# Patient Record
Sex: Male | Born: 1935 | Race: Black or African American | Hispanic: No | Marital: Married | Smoking: Former smoker
Health system: Southern US, Community
[De-identification: ages and names within clinical notes are randomized; demographics above are authoritative.]

## PROBLEM LIST (undated history)

## (undated) DIAGNOSIS — I5032 Chronic diastolic (congestive) heart failure: Secondary | ICD-10-CM

## (undated) DIAGNOSIS — E119 Type 2 diabetes mellitus without complications: Secondary | ICD-10-CM

## (undated) DIAGNOSIS — D649 Anemia, unspecified: Secondary | ICD-10-CM

## (undated) DIAGNOSIS — C8590 Non-Hodgkin lymphoma, unspecified, unspecified site: Secondary | ICD-10-CM

## (undated) DIAGNOSIS — I10 Essential (primary) hypertension: Secondary | ICD-10-CM

## (undated) DIAGNOSIS — I4891 Unspecified atrial fibrillation: Secondary | ICD-10-CM

## (undated) DIAGNOSIS — C801 Malignant (primary) neoplasm, unspecified: Secondary | ICD-10-CM

## (undated) DIAGNOSIS — F0390 Unspecified dementia without behavioral disturbance: Secondary | ICD-10-CM

## (undated) HISTORY — DX: Unspecified dementia, unspecified severity, without behavioral disturbance, psychotic disturbance, mood disturbance, and anxiety: F03.90

## (undated) HISTORY — DX: Unspecified atrial fibrillation: I48.91

## (undated) HISTORY — PX: NO PAST SURGERIES: SHX2092

## (undated) HISTORY — PX: CORONARY STENT PLACEMENT: SHX1402

## (undated) HISTORY — DX: Anemia, unspecified: D64.9

## (undated) HISTORY — DX: Non-Hodgkin lymphoma, unspecified, unspecified site: C85.90

## (undated) HISTORY — DX: Chronic diastolic (congestive) heart failure: I50.32

## (undated) HISTORY — DX: Essential (primary) hypertension: I10

## (undated) HISTORY — DX: Type 2 diabetes mellitus without complications: E11.9

---

## 2000-11-25 ENCOUNTER — Encounter: Payer: Self-pay | Admitting: Emergency Medicine

## 2000-11-25 ENCOUNTER — Emergency Department (HOSPITAL_COMMUNITY): Admission: EM | Admit: 2000-11-25 | Discharge: 2000-11-26 | Payer: Self-pay | Admitting: Emergency Medicine

## 2001-10-17 ENCOUNTER — Ambulatory Visit (HOSPITAL_COMMUNITY): Admission: RE | Admit: 2001-10-17 | Discharge: 2001-10-17 | Payer: Self-pay | Admitting: Gastroenterology

## 2001-10-17 ENCOUNTER — Encounter (INDEPENDENT_AMBULATORY_CARE_PROVIDER_SITE_OTHER): Payer: Self-pay | Admitting: Specialist

## 2008-05-23 ENCOUNTER — Ambulatory Visit: Payer: Self-pay | Admitting: Oncology

## 2008-05-27 ENCOUNTER — Encounter (INDEPENDENT_AMBULATORY_CARE_PROVIDER_SITE_OTHER): Payer: Self-pay | Admitting: Diagnostic Radiology

## 2008-05-27 ENCOUNTER — Ambulatory Visit (HOSPITAL_COMMUNITY): Admission: RE | Admit: 2008-05-27 | Discharge: 2008-05-27 | Payer: Self-pay | Admitting: Neurosurgery

## 2008-06-03 ENCOUNTER — Ambulatory Visit: Admission: RE | Admit: 2008-06-03 | Discharge: 2008-07-23 | Payer: Self-pay | Admitting: Radiation Oncology

## 2008-06-03 LAB — CBC WITH DIFFERENTIAL (CANCER CENTER ONLY)
Eosinophils Absolute: 0.4 10*3/uL (ref 0.0–0.5)
HCT: 28.9 % — ABNORMAL LOW (ref 38.7–49.9)
LYMPH%: 14.9 % (ref 14.0–48.0)
MCV: 66 fL — ABNORMAL LOW (ref 82–98)
MONO#: 0.3 10*3/uL (ref 0.1–0.9)
NEUT%: 69.2 % (ref 40.0–80.0)
Platelets: 349 10*3/uL (ref 145–400)
RBC: 4.35 10*6/uL (ref 4.20–5.70)
WBC: 4.7 10*3/uL (ref 4.0–10.0)

## 2008-06-03 LAB — CMP (CANCER CENTER ONLY)
Alkaline Phosphatase: 62 U/L (ref 26–84)
Creat: 1.4 mg/dl — ABNORMAL HIGH (ref 0.6–1.2)
Glucose, Bld: 212 mg/dL — ABNORMAL HIGH (ref 73–118)
Sodium: 138 mEq/L (ref 128–145)
Total Bilirubin: 0.5 mg/dl (ref 0.20–1.60)
Total Protein: 7.9 g/dL (ref 6.4–8.1)

## 2008-06-03 LAB — IRON AND TIBC: TIBC: 473 ug/dL — ABNORMAL HIGH (ref 215–435)

## 2008-06-04 LAB — BETA 2 MICROGLOBULIN, SERUM: Beta-2 Microglobulin: 2.85 mg/L — ABNORMAL HIGH (ref 1.01–1.73)

## 2008-06-04 LAB — URIC ACID: Uric Acid, Serum: 7.1 mg/dL (ref 4.0–7.8)

## 2008-06-05 ENCOUNTER — Ambulatory Visit (HOSPITAL_COMMUNITY): Admission: RE | Admit: 2008-06-05 | Discharge: 2008-06-05 | Payer: Self-pay | Admitting: Oncology

## 2008-06-11 LAB — CBC WITH DIFFERENTIAL (CANCER CENTER ONLY)
BASO#: 0 10*3/uL (ref 0.0–0.2)
BASO%: 0.4 % (ref 0.0–2.0)
HCT: 29.9 % — ABNORMAL LOW (ref 38.7–49.9)
HGB: 9.7 g/dL — ABNORMAL LOW (ref 13.0–17.1)
LYMPH%: 15.5 % (ref 14.0–48.0)
MCV: 69 fL — ABNORMAL LOW (ref 82–98)
MONO#: 0.4 10*3/uL (ref 0.1–0.9)
NEUT%: 70.2 % (ref 40.0–80.0)
RDW: 17.9 % — ABNORMAL HIGH (ref 10.5–14.6)
WBC: 6 10*3/uL (ref 4.0–10.0)

## 2008-06-16 ENCOUNTER — Inpatient Hospital Stay (HOSPITAL_COMMUNITY): Admission: EM | Admit: 2008-06-16 | Discharge: 2008-06-23 | Payer: Self-pay | Admitting: Emergency Medicine

## 2008-06-16 ENCOUNTER — Ambulatory Visit: Payer: Self-pay | Admitting: Oncology

## 2008-06-16 ENCOUNTER — Ambulatory Visit: Payer: Self-pay | Admitting: Internal Medicine

## 2008-06-21 ENCOUNTER — Ambulatory Visit: Payer: Self-pay | Admitting: Oncology

## 2008-06-26 LAB — CBC WITH DIFFERENTIAL (CANCER CENTER ONLY)
BASO#: 0 10*3/uL (ref 0.0–0.2)
EOS%: 1.2 % (ref 0.0–7.0)
Eosinophils Absolute: 0.1 10*3/uL (ref 0.0–0.5)
HGB: 11.3 g/dL — ABNORMAL LOW (ref 13.0–17.1)
LYMPH#: 0.2 10*3/uL — ABNORMAL LOW (ref 0.9–3.3)
MCHC: 32.2 g/dL (ref 32.0–35.9)
MONO%: 5.6 % (ref 0.0–13.0)
NEUT#: 6.8 10*3/uL — ABNORMAL HIGH (ref 1.5–6.5)
Platelets: 200 10*3/uL (ref 145–400)
RBC: 4.87 10*6/uL (ref 4.20–5.70)

## 2008-06-26 LAB — BASIC METABOLIC PANEL - CANCER CENTER ONLY
BUN, Bld: 30 mg/dL — ABNORMAL HIGH (ref 7–22)
Chloride: 98 mEq/L (ref 98–108)
Glucose, Bld: 453 mg/dL — ABNORMAL HIGH (ref 73–118)
Potassium: 4.7 mEq/L (ref 3.3–4.7)

## 2008-07-02 ENCOUNTER — Ambulatory Visit: Payer: Self-pay | Admitting: Oncology

## 2008-07-02 ENCOUNTER — Ambulatory Visit (HOSPITAL_COMMUNITY): Admission: RE | Admit: 2008-07-02 | Discharge: 2008-07-02 | Payer: Self-pay | Admitting: Oncology

## 2008-07-02 LAB — CBC WITH DIFFERENTIAL (CANCER CENTER ONLY)
BASO%: 0.3 % (ref 0.0–2.0)
EOS%: 1.5 % (ref 0.0–7.0)
LYMPH#: 0.1 10*3/uL — ABNORMAL LOW (ref 0.9–3.3)
MCH: 23.8 pg — ABNORMAL LOW (ref 28.0–33.4)
MCHC: 32.3 g/dL (ref 32.0–35.9)
MONO%: 3.6 % (ref 0.0–13.0)
NEUT#: 6.6 10*3/uL — ABNORMAL HIGH (ref 1.5–6.5)
RDW: 22 % — ABNORMAL HIGH (ref 10.5–14.6)

## 2008-07-02 LAB — CMP (CANCER CENTER ONLY)
Albumin: 2.9 g/dL — ABNORMAL LOW (ref 3.3–5.5)
Alkaline Phosphatase: 50 U/L (ref 26–84)
BUN, Bld: 28 mg/dL — ABNORMAL HIGH (ref 7–22)
Glucose, Bld: 286 mg/dL — ABNORMAL HIGH (ref 73–118)
Potassium: 4.7 mEq/L (ref 3.3–4.7)

## 2008-07-04 LAB — CBC WITH DIFFERENTIAL/PLATELET
BASO%: 0.3 % (ref 0.0–2.0)
Eosinophils Absolute: 0.1 10*3/uL (ref 0.0–0.5)
HCT: 39 % (ref 38.4–49.9)
LYMPH%: 3.1 % — ABNORMAL LOW (ref 14.0–49.0)
MCHC: 32.3 g/dL (ref 32.0–36.0)
MCV: 75 fL — ABNORMAL LOW (ref 79.3–98.0)
MONO#: 0.2 10*3/uL (ref 0.1–0.9)
NEUT%: 91.3 % — ABNORMAL HIGH (ref 39.0–75.0)
Platelets: 31 10*3/uL — ABNORMAL LOW (ref 140–400)
WBC: 6.5 10*3/uL (ref 4.0–10.3)

## 2008-07-10 LAB — CBC WITH DIFFERENTIAL (CANCER CENTER ONLY)
BASO#: 0 10*3/uL (ref 0.0–0.2)
BASO%: 0.3 % (ref 0.0–2.0)
HCT: 34.5 % — ABNORMAL LOW (ref 38.7–49.9)
HGB: 11.5 g/dL — ABNORMAL LOW (ref 13.0–17.1)
LYMPH#: 0.2 10*3/uL — ABNORMAL LOW (ref 0.9–3.3)
MONO#: 0.3 10*3/uL (ref 0.1–0.9)
NEUT%: 83.4 % — ABNORMAL HIGH (ref 40.0–80.0)
RBC: 4.77 10*6/uL (ref 4.20–5.70)
RDW: 23.6 % — ABNORMAL HIGH (ref 10.5–14.6)
WBC: 4.1 10*3/uL (ref 4.0–10.0)

## 2008-07-10 LAB — CMP (CANCER CENTER ONLY)
BUN, Bld: 19 mg/dL (ref 7–22)
CO2: 27 mEq/L (ref 18–33)
Calcium: 9.2 mg/dL (ref 8.0–10.3)
Chloride: 93 mEq/L — ABNORMAL LOW (ref 98–108)
Creat: 1.1 mg/dl (ref 0.6–1.2)

## 2008-07-17 LAB — CBC WITH DIFFERENTIAL (CANCER CENTER ONLY)
BASO#: 0 10*3/uL (ref 0.0–0.2)
EOS%: 2 % (ref 0.0–7.0)
HCT: 34.2 % — ABNORMAL LOW (ref 38.7–49.9)
HGB: 11.5 g/dL — ABNORMAL LOW (ref 13.0–17.1)
LYMPH#: 0.2 10*3/uL — ABNORMAL LOW (ref 0.9–3.3)
MCHC: 33.8 g/dL (ref 32.0–35.9)
NEUT%: 73.6 % (ref 40.0–80.0)

## 2008-07-17 LAB — BASIC METABOLIC PANEL - CANCER CENTER ONLY
CO2: 27 mEq/L (ref 18–33)
Chloride: 98 mEq/L (ref 98–108)
Potassium: 4.8 mEq/L — ABNORMAL HIGH (ref 3.3–4.7)
Sodium: 140 mEq/L (ref 128–145)

## 2008-07-23 ENCOUNTER — Encounter: Admission: RE | Admit: 2008-07-23 | Discharge: 2008-10-21 | Payer: Self-pay | Admitting: Oncology

## 2008-07-24 LAB — CMP (CANCER CENTER ONLY)
ALT(SGPT): 35 U/L (ref 10–47)
BUN, Bld: 16 mg/dL (ref 7–22)
CO2: 29 mEq/L (ref 18–33)
Calcium: 9.3 mg/dL (ref 8.0–10.3)
Chloride: 98 mEq/L (ref 98–108)
Creat: 1.6 mg/dl — ABNORMAL HIGH (ref 0.6–1.2)

## 2008-07-24 LAB — CBC WITH DIFFERENTIAL (CANCER CENTER ONLY)
BASO#: 0 10*3/uL (ref 0.0–0.2)
BASO%: 0.9 % (ref 0.0–2.0)
EOS%: 0.5 % (ref 0.0–7.0)
HCT: 33.7 % — ABNORMAL LOW (ref 38.7–49.9)
LYMPH%: 24.6 % (ref 14.0–48.0)
MCH: 24.8 pg — ABNORMAL LOW (ref 28.0–33.4)
MCHC: 34.1 g/dL (ref 32.0–35.9)
MCV: 73 fL — ABNORMAL LOW (ref 82–98)
MONO%: 7.7 % (ref 0.0–13.0)
NEUT%: 66.3 % (ref 40.0–80.0)
RDW: 25.5 % — ABNORMAL HIGH (ref 10.5–14.6)

## 2008-07-29 LAB — COMPREHENSIVE METABOLIC PANEL
ALT: 25 U/L (ref 0–53)
CO2: 29 mEq/L (ref 19–32)
Chloride: 98 mEq/L (ref 96–112)
Sodium: 136 mEq/L (ref 135–145)
Total Bilirubin: 0.6 mg/dL (ref 0.3–1.2)
Total Protein: 5.7 g/dL — ABNORMAL LOW (ref 6.0–8.3)

## 2008-07-29 LAB — CBC WITH DIFFERENTIAL (CANCER CENTER ONLY)
Eosinophils Absolute: 0 10*3/uL (ref 0.0–0.5)
MONO#: 0.4 10*3/uL (ref 0.1–0.9)
NEUT#: 2.7 10*3/uL (ref 1.5–6.5)
Platelets: 162 10*3/uL (ref 145–400)
RBC: 4.75 10*6/uL (ref 4.20–5.70)
WBC: 5.8 10*3/uL (ref 4.0–10.0)

## 2008-08-05 ENCOUNTER — Ambulatory Visit: Payer: Self-pay | Admitting: Oncology

## 2008-08-07 LAB — CBC WITH DIFFERENTIAL (CANCER CENTER ONLY)
BASO#: 0.1 10*3/uL (ref 0.0–0.2)
EOS%: 0.7 % (ref 0.0–7.0)
HGB: 10 g/dL — ABNORMAL LOW (ref 13.0–17.1)
MCH: 24.5 pg — ABNORMAL LOW (ref 28.0–33.4)
MCHC: 33.2 g/dL (ref 32.0–35.9)
MONO%: 9.1 % (ref 0.0–13.0)
NEUT#: 2.3 10*3/uL (ref 1.5–6.5)
NEUT%: 40.1 % (ref 40.0–80.0)
RDW: 22.6 % — ABNORMAL HIGH (ref 10.5–14.6)

## 2008-08-07 LAB — BASIC METABOLIC PANEL - CANCER CENTER ONLY
BUN, Bld: 14 mg/dL (ref 7–22)
Creat: 1.3 mg/dl — ABNORMAL HIGH (ref 0.6–1.2)
Glucose, Bld: 126 mg/dL — ABNORMAL HIGH (ref 73–118)
Potassium: 4.6 mEq/L (ref 3.3–4.7)

## 2008-08-14 LAB — CMP (CANCER CENTER ONLY)
Alkaline Phosphatase: 91 U/L — ABNORMAL HIGH (ref 26–84)
Glucose, Bld: 191 mg/dL — ABNORMAL HIGH (ref 73–118)
Sodium: 138 mEq/L (ref 128–145)
Total Bilirubin: 0.4 mg/dl (ref 0.20–1.60)
Total Protein: 6.1 g/dL — ABNORMAL LOW (ref 6.4–8.1)

## 2008-08-14 LAB — MANUAL DIFFERENTIAL (CHCC SATELLITE)
Band Neutrophils: 17 % — ABNORMAL HIGH (ref 0–10)
LYMPH: 39 % (ref 14–48)
MONO: 14 % — ABNORMAL HIGH (ref 0–13)
Myelocytes: 1 % — ABNORMAL HIGH (ref 0–0)
SEG: 29 % — ABNORMAL LOW (ref 40–75)

## 2008-08-14 LAB — CBC WITH DIFFERENTIAL (CANCER CENTER ONLY)
MCH: 24.8 pg — ABNORMAL LOW (ref 28.0–33.4)
MCHC: 33.7 g/dL (ref 32.0–35.9)
Platelets: 126 10*3/uL — ABNORMAL LOW (ref 145–400)

## 2008-08-28 ENCOUNTER — Encounter (HOSPITAL_COMMUNITY): Admission: RE | Admit: 2008-08-28 | Discharge: 2008-10-02 | Payer: Self-pay | Admitting: Oncology

## 2008-08-28 LAB — CBC WITH DIFFERENTIAL (CANCER CENTER ONLY)
BASO%: 0.5 % (ref 0.0–2.0)
LYMPH#: 0.8 10*3/uL — ABNORMAL LOW (ref 0.9–3.3)
MONO#: 0.4 10*3/uL (ref 0.1–0.9)
NEUT#: 3.7 10*3/uL (ref 1.5–6.5)
Platelets: 211 10*3/uL (ref 145–400)
RDW: 22 % — ABNORMAL HIGH (ref 10.5–14.6)
WBC: 4.9 10*3/uL (ref 4.0–10.0)

## 2008-08-28 LAB — BASIC METABOLIC PANEL - CANCER CENTER ONLY
BUN, Bld: 12 mg/dL (ref 7–22)
CO2: 28 mEq/L (ref 18–33)
Chloride: 102 mEq/L (ref 98–108)
Potassium: 3.8 mEq/L (ref 3.3–4.7)

## 2008-09-02 ENCOUNTER — Ambulatory Visit: Payer: Self-pay | Admitting: Oncology

## 2008-09-04 LAB — MANUAL DIFFERENTIAL (CHCC SATELLITE)
ALC: 0.5 10*3/uL — ABNORMAL LOW (ref 0.9–3.3)
Eos: 3 % (ref 0–7)
LYMPH: 10 % — ABNORMAL LOW (ref 14–48)
MONO: 4 % (ref 0–13)
PLT EST ~~LOC~~: DECREASED
nRBC: 2 % — ABNORMAL HIGH (ref 0–0)

## 2008-09-04 LAB — CMP (CANCER CENTER ONLY)
AST: 22 U/L (ref 11–38)
Albumin: 3.2 g/dL — ABNORMAL LOW (ref 3.3–5.5)
Alkaline Phosphatase: 101 U/L — ABNORMAL HIGH (ref 26–84)
Glucose, Bld: 130 mg/dL — ABNORMAL HIGH (ref 73–118)
Potassium: 3.5 mEq/L (ref 3.3–4.7)
Sodium: 142 mEq/L (ref 128–145)
Total Bilirubin: 0.5 mg/dl (ref 0.20–1.60)
Total Protein: 6.1 g/dL — ABNORMAL LOW (ref 6.4–8.1)

## 2008-09-04 LAB — CBC WITH DIFFERENTIAL (CANCER CENTER ONLY)
HCT: 27.5 % — ABNORMAL LOW (ref 38.7–49.9)
MCHC: 32.8 g/dL (ref 32.0–35.9)
MCV: 85 fL (ref 82–98)
Platelets: 89 10*3/uL — ABNORMAL LOW (ref 145–400)
RDW: 19.1 % — ABNORMAL HIGH (ref 10.5–14.6)
WBC: 4.8 10*3/uL (ref 4.0–10.0)

## 2008-09-05 ENCOUNTER — Encounter: Payer: Self-pay | Admitting: Cardiology

## 2008-09-10 LAB — CBC WITH DIFFERENTIAL (CANCER CENTER ONLY)
BASO#: 0 10*3/uL (ref 0.0–0.2)
EOS%: 1.7 % (ref 0.0–7.0)
HGB: 10 g/dL — ABNORMAL LOW (ref 13.0–17.1)
LYMPH#: 0.8 10*3/uL — ABNORMAL LOW (ref 0.9–3.3)
MCH: 28.4 pg (ref 28.0–33.4)
MCHC: 32.5 g/dL (ref 32.0–35.9)
MONO%: 12.7 % (ref 0.0–13.0)
NEUT#: 3.4 10*3/uL (ref 1.5–6.5)
Platelets: 175 10*3/uL (ref 145–400)
RBC: 3.52 10*6/uL — ABNORMAL LOW (ref 4.20–5.70)

## 2008-09-10 LAB — TECHNOLOGIST REVIEW CHCC SATELLITE

## 2008-09-18 LAB — CBC WITH DIFFERENTIAL (CANCER CENTER ONLY)
BASO#: 0 10*3/uL (ref 0.0–0.2)
Eosinophils Absolute: 0.1 10*3/uL (ref 0.0–0.5)
HCT: 28.9 % — ABNORMAL LOW (ref 38.7–49.9)
HGB: 9.7 g/dL — ABNORMAL LOW (ref 13.0–17.1)
LYMPH%: 18.1 % (ref 14.0–48.0)
MCH: 29.6 pg (ref 28.0–33.4)
MCV: 88 fL (ref 82–98)
MONO#: 0.4 10*3/uL (ref 0.1–0.9)
NEUT%: 72.6 % (ref 40.0–80.0)
Platelets: 195 10*3/uL (ref 145–400)
RBC: 3.27 10*6/uL — ABNORMAL LOW (ref 4.20–5.70)
WBC: 4.9 10*3/uL (ref 4.0–10.0)

## 2008-09-18 LAB — CMP (CANCER CENTER ONLY)
ALT(SGPT): 14 U/L (ref 10–47)
Albumin: 3.6 g/dL (ref 3.3–5.5)
Alkaline Phosphatase: 61 U/L (ref 26–84)
CO2: 29 mEq/L (ref 18–33)
Potassium: 4 mEq/L (ref 3.3–4.7)
Sodium: 143 mEq/L (ref 128–145)
Total Bilirubin: 0.5 mg/dl (ref 0.20–1.60)
Total Protein: 6.6 g/dL (ref 6.4–8.1)

## 2008-09-25 LAB — CBC WITH DIFFERENTIAL (CANCER CENTER ONLY)
HCT: 24.9 % — ABNORMAL LOW (ref 38.7–49.9)
MCH: 29.8 pg (ref 28.0–33.4)
MCV: 89 fL (ref 82–98)
RDW: 17.7 % — ABNORMAL HIGH (ref 10.5–14.6)
WBC: 1.6 10*3/uL — ABNORMAL LOW (ref 4.0–10.0)

## 2008-09-25 LAB — MANUAL DIFFERENTIAL (CHCC SATELLITE)
ALC: 0.4 10*3/uL — ABNORMAL LOW (ref 0.9–3.3)
ANC (CHCC HP manual diff): 0.9 10*3/uL — ABNORMAL LOW (ref 1.5–6.5)
BASO: 2 % (ref 0–2)
Blasts: 1 % — ABNORMAL HIGH (ref 0–0)
Metamyelocytes: 7 % — ABNORMAL HIGH (ref 0–0)
Myelocytes: 5 % — ABNORMAL HIGH (ref 0–0)
PLT EST ~~LOC~~: DECREASED
PROMYELO: 1 % — ABNORMAL HIGH (ref 0–0)

## 2008-09-25 LAB — BASIC METABOLIC PANEL - CANCER CENTER ONLY
BUN, Bld: 25 mg/dL — ABNORMAL HIGH (ref 7–22)
Calcium: 9.6 mg/dL (ref 8.0–10.3)
Glucose, Bld: 114 mg/dL (ref 73–118)

## 2008-10-08 ENCOUNTER — Ambulatory Visit: Payer: Self-pay | Admitting: Oncology

## 2008-10-09 LAB — CMP (CANCER CENTER ONLY)
AST: 27 U/L (ref 11–38)
Albumin: 3.8 g/dL (ref 3.3–5.5)
Alkaline Phosphatase: 64 U/L (ref 26–84)
Calcium: 9.5 mg/dL (ref 8.0–10.3)
Chloride: 106 mEq/L (ref 98–108)
Potassium: 4.3 mEq/L (ref 3.3–4.7)
Sodium: 144 mEq/L (ref 128–145)
Total Protein: 6.8 g/dL (ref 6.4–8.1)

## 2008-10-09 LAB — CBC WITH DIFFERENTIAL (CANCER CENTER ONLY)
BASO#: 0 10*3/uL (ref 0.0–0.2)
EOS%: 1.5 % (ref 0.0–7.0)
HCT: 30.7 % — ABNORMAL LOW (ref 38.7–49.9)
HGB: 10.2 g/dL — ABNORMAL LOW (ref 13.0–17.1)
LYMPH#: 1.2 10*3/uL (ref 0.9–3.3)
MCHC: 33.2 g/dL (ref 32.0–35.9)
MONO#: 0.5 10*3/uL (ref 0.1–0.9)
NEUT%: 63.6 % (ref 40.0–80.0)

## 2008-10-16 LAB — CBC WITH DIFFERENTIAL (CANCER CENTER ONLY)
BASO#: 0 10*3/uL (ref 0.0–0.2)
Eosinophils Absolute: 0 10*3/uL (ref 0.0–0.5)
HGB: 8.8 g/dL — ABNORMAL LOW (ref 13.0–17.1)
LYMPH#: 0.5 10*3/uL — ABNORMAL LOW (ref 0.9–3.3)
MONO#: 0.5 10*3/uL (ref 0.1–0.9)
NEUT#: 0.1 10*3/uL — CL (ref 1.5–6.5)
Platelets: 48 10*3/uL — ABNORMAL LOW (ref 145–400)
RBC: 2.87 10*6/uL — ABNORMAL LOW (ref 4.20–5.70)
WBC: 1.1 10*3/uL — ABNORMAL LOW (ref 4.0–10.0)

## 2008-10-16 LAB — TECHNOLOGIST REVIEW CHCC SATELLITE

## 2008-10-16 LAB — BASIC METABOLIC PANEL - CANCER CENTER ONLY
BUN, Bld: 19 mg/dL (ref 7–22)
Calcium: 9.2 mg/dL (ref 8.0–10.3)
Creat: 1.1 mg/dl (ref 0.6–1.2)

## 2008-10-22 LAB — MANUAL DIFFERENTIAL (CHCC SATELLITE)
ANC (CHCC HP manual diff): 4.9 10*3/uL (ref 1.5–6.5)
Blasts: 1 % — ABNORMAL HIGH (ref 0–0)
LYMPH: 10 % — ABNORMAL LOW (ref 14–48)
Metamyelocytes: 3 % — ABNORMAL HIGH (ref 0–0)

## 2008-10-22 LAB — CBC WITH DIFFERENTIAL (CANCER CENTER ONLY)
HCT: 27.8 % — ABNORMAL LOW (ref 38.7–49.9)
HGB: 9.1 g/dL — ABNORMAL LOW (ref 13.0–17.1)
MCH: 31.1 pg (ref 28.0–33.4)
MCHC: 32.7 g/dL (ref 32.0–35.9)

## 2008-10-30 LAB — CBC WITH DIFFERENTIAL (CANCER CENTER ONLY)
BASO%: 0.4 % (ref 0.0–2.0)
HCT: 29.2 % — ABNORMAL LOW (ref 38.7–49.9)
LYMPH%: 22 % (ref 14.0–48.0)
MCH: 31.6 pg (ref 28.0–33.4)
MCHC: 33 g/dL (ref 32.0–35.9)
MCV: 96 fL (ref 82–98)
MONO%: 13 % (ref 0.0–13.0)
NEUT%: 63.3 % (ref 40.0–80.0)
Platelets: 154 10*3/uL (ref 145–400)
RDW: 14.5 % (ref 10.5–14.6)

## 2008-10-30 LAB — CMP (CANCER CENTER ONLY)
ALT(SGPT): 16 U/L (ref 10–47)
Albumin: 3.4 g/dL (ref 3.3–5.5)
CO2: 27 mEq/L (ref 18–33)
Calcium: 9.3 mg/dL (ref 8.0–10.3)
Chloride: 103 mEq/L (ref 98–108)
Glucose, Bld: 199 mg/dL — ABNORMAL HIGH (ref 73–118)
Potassium: 4.4 mEq/L (ref 3.3–4.7)
Sodium: 141 mEq/L (ref 128–145)
Total Bilirubin: 0.5 mg/dl (ref 0.20–1.60)
Total Protein: 6.3 g/dL — ABNORMAL LOW (ref 6.4–8.1)

## 2008-11-06 LAB — CBC WITH DIFFERENTIAL/PLATELET
Basophils Absolute: 0 10*3/uL (ref 0.0–0.1)
Eosinophils Absolute: 0 10*3/uL (ref 0.0–0.5)
HCT: 25.4 % — ABNORMAL LOW (ref 38.4–49.9)
HGB: 8.7 g/dL — ABNORMAL LOW (ref 13.0–17.1)
LYMPH%: 29.8 % (ref 14.0–49.0)
MCV: 97.6 fL (ref 79.3–98.0)
MONO#: 0.2 10*3/uL (ref 0.1–0.9)
MONO%: 17.1 % — ABNORMAL HIGH (ref 0.0–14.0)
NEUT#: 0.6 10*3/uL — ABNORMAL LOW (ref 1.5–6.5)
NEUT%: 48.9 % (ref 39.0–75.0)
Platelets: 13 10*3/uL — ABNORMAL LOW (ref 140–400)
RBC: 2.6 10*6/uL — ABNORMAL LOW (ref 4.20–5.82)
WBC: 1.2 10*3/uL — ABNORMAL LOW (ref 4.0–10.3)

## 2008-11-06 LAB — BASIC METABOLIC PANEL - CANCER CENTER ONLY
BUN, Bld: 27 mg/dL — ABNORMAL HIGH (ref 7–22)
Chloride: 101 mEq/L (ref 98–108)
Potassium: 3.9 mEq/L (ref 3.3–4.7)
Sodium: 138 mEq/L (ref 128–145)

## 2008-11-07 ENCOUNTER — Encounter (HOSPITAL_COMMUNITY): Admission: RE | Admit: 2008-11-07 | Discharge: 2009-01-01 | Payer: Self-pay | Admitting: Oncology

## 2008-11-07 ENCOUNTER — Ambulatory Visit: Payer: Self-pay | Admitting: Oncology

## 2008-11-11 LAB — MANUAL DIFFERENTIAL (CHCC SATELLITE)
Band Neutrophils: 15 % — ABNORMAL HIGH (ref 0–10)
Eos: 2 % (ref 0–7)
LYMPH: 11 % — ABNORMAL LOW (ref 14–48)
MONO: 6 % (ref 0–13)
Metamyelocytes: 1 % — ABNORMAL HIGH (ref 0–0)
PLT EST ~~LOC~~: DECREASED
nRBC: 2 % — ABNORMAL HIGH (ref 0–0)

## 2008-11-11 LAB — CBC WITH DIFFERENTIAL (CANCER CENTER ONLY)
MCHC: 33.1 g/dL (ref 32.0–35.9)
Platelets: 87 10*3/uL — ABNORMAL LOW (ref 145–400)
RDW: 13.8 % (ref 10.5–14.6)
WBC: 4.8 10*3/uL (ref 4.0–10.0)

## 2008-11-20 ENCOUNTER — Encounter: Payer: Self-pay | Admitting: Cardiology

## 2008-11-20 LAB — CBC WITH DIFFERENTIAL (CANCER CENTER ONLY)
BASO%: 0.6 % (ref 0.0–2.0)
Eosinophils Absolute: 0.1 10*3/uL (ref 0.0–0.5)
HCT: 27.7 % — ABNORMAL LOW (ref 38.7–49.9)
HGB: 9 g/dL — ABNORMAL LOW (ref 13.0–17.1)
LYMPH#: 0.8 10*3/uL — ABNORMAL LOW (ref 0.9–3.3)
MONO#: 0.4 10*3/uL (ref 0.1–0.9)
NEUT%: 58.9 % (ref 40.0–80.0)
RBC: 2.83 10*6/uL — ABNORMAL LOW (ref 4.20–5.70)
RDW: 14.1 % (ref 10.5–14.6)
WBC: 3.1 10*3/uL — ABNORMAL LOW (ref 4.0–10.0)

## 2008-11-20 LAB — CMP (CANCER CENTER ONLY)
AST: 26 U/L (ref 11–38)
Albumin: 3.4 g/dL (ref 3.3–5.5)
Alkaline Phosphatase: 61 U/L (ref 26–84)
Glucose, Bld: 209 mg/dL — ABNORMAL HIGH (ref 73–118)
Potassium: 4.1 mEq/L (ref 3.3–4.7)
Sodium: 141 mEq/L (ref 128–145)
Total Bilirubin: 0.4 mg/dl (ref 0.20–1.60)
Total Protein: 6.3 g/dL — ABNORMAL LOW (ref 6.4–8.1)

## 2008-11-26 LAB — MANUAL DIFFERENTIAL (CHCC SATELLITE)
ALC: 0.5 10*3/uL — ABNORMAL LOW (ref 0.9–3.3)
BASO: 1 % (ref 0–2)
LYMPH: 11 % — ABNORMAL LOW (ref 14–48)
MONO: 3 % (ref 0–13)
PLT EST ~~LOC~~: DECREASED
SEG: 72 % (ref 40–75)

## 2008-11-26 LAB — CBC WITH DIFFERENTIAL (CANCER CENTER ONLY)
MCH: 32.1 pg (ref 28.0–33.4)
Platelets: 60 10*3/uL — ABNORMAL LOW (ref 145–400)
RBC: 2.55 10*6/uL — ABNORMAL LOW (ref 4.20–5.70)
WBC: 4.3 10*3/uL (ref 4.0–10.0)

## 2008-12-19 ENCOUNTER — Ambulatory Visit: Payer: Self-pay | Admitting: Oncology

## 2008-12-22 ENCOUNTER — Ambulatory Visit (HOSPITAL_COMMUNITY): Admission: RE | Admit: 2008-12-22 | Discharge: 2008-12-22 | Payer: Self-pay | Admitting: Oncology

## 2008-12-24 ENCOUNTER — Encounter: Payer: Self-pay | Admitting: Cardiology

## 2008-12-24 LAB — CMP (CANCER CENTER ONLY)
ALT(SGPT): 16 U/L (ref 10–47)
AST: 25 U/L (ref 11–38)
Alkaline Phosphatase: 62 U/L (ref 26–84)
CO2: 30 mEq/L (ref 18–33)
Creat: 1.4 mg/dl — ABNORMAL HIGH (ref 0.6–1.2)
Sodium: 148 mEq/L — ABNORMAL HIGH (ref 128–145)
Total Bilirubin: 0.5 mg/dl (ref 0.20–1.60)
Total Protein: 6.8 g/dL (ref 6.4–8.1)

## 2008-12-24 LAB — CBC WITH DIFFERENTIAL (CANCER CENTER ONLY)
BASO%: 0.5 % (ref 0.0–2.0)
EOS%: 3.1 % (ref 0.0–7.0)
Eosinophils Absolute: 0.2 10*3/uL (ref 0.0–0.5)
HGB: 9.5 g/dL — ABNORMAL LOW (ref 13.0–17.1)
LYMPH#: 1.6 10*3/uL (ref 0.9–3.3)
LYMPH%: 28.1 % (ref 14.0–48.0)
MCH: 30 pg (ref 28.0–33.4)
MONO#: 0.5 10*3/uL (ref 0.1–0.9)
NEUT#: 3.4 10*3/uL (ref 1.5–6.5)
NEUT%: 58.9 % (ref 40.0–80.0)
RBC: 3.18 10*6/uL — ABNORMAL LOW (ref 4.20–5.70)
WBC: 5.7 10*3/uL (ref 4.0–10.0)

## 2008-12-24 LAB — LACTATE DEHYDROGENASE: LDH: 199 U/L (ref 94–250)

## 2009-01-20 ENCOUNTER — Ambulatory Visit: Payer: Self-pay | Admitting: Oncology

## 2009-01-22 ENCOUNTER — Ambulatory Visit (HOSPITAL_COMMUNITY): Admission: RE | Admit: 2009-01-22 | Discharge: 2009-01-22 | Payer: Self-pay | Admitting: Oncology

## 2009-03-06 ENCOUNTER — Ambulatory Visit: Payer: Self-pay | Admitting: Cardiology

## 2009-03-06 DIAGNOSIS — I959 Hypotension, unspecified: Secondary | ICD-10-CM

## 2009-03-06 DIAGNOSIS — I5041 Acute combined systolic (congestive) and diastolic (congestive) heart failure: Secondary | ICD-10-CM

## 2009-03-06 DIAGNOSIS — I4891 Unspecified atrial fibrillation: Secondary | ICD-10-CM

## 2009-03-06 DIAGNOSIS — C8589 Other specified types of non-Hodgkin lymphoma, extranodal and solid organ sites: Secondary | ICD-10-CM | POA: Insufficient documentation

## 2009-03-09 ENCOUNTER — Ambulatory Visit (HOSPITAL_COMMUNITY): Admission: RE | Admit: 2009-03-09 | Discharge: 2009-03-09 | Payer: Self-pay | Admitting: Cardiology

## 2009-03-09 ENCOUNTER — Ambulatory Visit: Payer: Self-pay | Admitting: Internal Medicine

## 2009-03-09 ENCOUNTER — Ambulatory Visit: Payer: Self-pay

## 2009-03-09 ENCOUNTER — Encounter: Payer: Self-pay | Admitting: Cardiology

## 2009-03-10 ENCOUNTER — Ambulatory Visit: Payer: Self-pay | Admitting: Cardiology

## 2009-03-12 LAB — CONVERTED CEMR LAB
Calcium: 9 mg/dL (ref 8.4–10.5)
GFR calc non Af Amer: 69.18 mL/min (ref >60)
Sodium: 143 meq/L (ref 135–145)

## 2009-03-18 ENCOUNTER — Ambulatory Visit: Payer: Self-pay | Admitting: Internal Medicine

## 2009-03-18 ENCOUNTER — Encounter (INDEPENDENT_AMBULATORY_CARE_PROVIDER_SITE_OTHER): Payer: Self-pay | Admitting: Cardiology

## 2009-03-18 LAB — CONVERTED CEMR LAB: POC INR: 1.2

## 2009-03-24 ENCOUNTER — Ambulatory Visit: Payer: Self-pay | Admitting: Cardiovascular Disease

## 2009-03-25 ENCOUNTER — Ambulatory Visit: Payer: Self-pay | Admitting: Oncology

## 2009-03-27 ENCOUNTER — Ambulatory Visit: Payer: Self-pay | Admitting: Cardiology

## 2009-03-31 ENCOUNTER — Ambulatory Visit: Payer: Self-pay | Admitting: Internal Medicine

## 2009-03-31 LAB — CONVERTED CEMR LAB: POC INR: 3.1

## 2009-04-10 ENCOUNTER — Ambulatory Visit: Payer: Self-pay | Admitting: Cardiology

## 2009-04-10 LAB — CONVERTED CEMR LAB: POC INR: 2.4

## 2009-04-20 ENCOUNTER — Encounter: Payer: Self-pay | Admitting: Cardiology

## 2009-04-20 ENCOUNTER — Ambulatory Visit: Payer: Self-pay | Admitting: Cardiology

## 2009-04-20 DIAGNOSIS — I5032 Chronic diastolic (congestive) heart failure: Secondary | ICD-10-CM | POA: Insufficient documentation

## 2009-04-27 ENCOUNTER — Ambulatory Visit: Payer: Self-pay | Admitting: Cardiovascular Disease

## 2009-05-01 ENCOUNTER — Ambulatory Visit: Payer: Self-pay | Admitting: Cardiology

## 2009-05-01 LAB — CONVERTED CEMR LAB: POC INR: 2.5

## 2009-05-08 ENCOUNTER — Ambulatory Visit: Payer: Self-pay | Admitting: Internal Medicine

## 2009-05-14 ENCOUNTER — Ambulatory Visit: Payer: Self-pay | Admitting: Oncology

## 2009-05-15 ENCOUNTER — Encounter (INDEPENDENT_AMBULATORY_CARE_PROVIDER_SITE_OTHER): Payer: Self-pay | Admitting: *Deleted

## 2009-05-15 ENCOUNTER — Ambulatory Visit: Payer: Self-pay | Admitting: Cardiology

## 2009-05-15 LAB — CONVERTED CEMR LAB: POC INR: 3.4

## 2009-05-19 ENCOUNTER — Ambulatory Visit (HOSPITAL_COMMUNITY): Admission: RE | Admit: 2009-05-19 | Discharge: 2009-05-19 | Payer: Self-pay | Admitting: Cardiology

## 2009-05-19 ENCOUNTER — Encounter: Payer: Self-pay | Admitting: Cardiology

## 2009-05-20 ENCOUNTER — Telehealth (INDEPENDENT_AMBULATORY_CARE_PROVIDER_SITE_OTHER): Payer: Self-pay | Admitting: *Deleted

## 2009-05-20 LAB — CMP (CANCER CENTER ONLY)
AST: 29 U/L (ref 11–38)
Albumin: 4.1 g/dL (ref 3.3–5.5)
Alkaline Phosphatase: 76 U/L (ref 26–84)
BUN, Bld: 31 mg/dL — ABNORMAL HIGH (ref 7–22)
Potassium: 4.3 mEq/L (ref 3.3–4.7)
Sodium: 138 mEq/L (ref 128–145)

## 2009-05-20 LAB — CBC WITH DIFFERENTIAL (CANCER CENTER ONLY)
BASO#: 0 10*3/uL (ref 0.0–0.2)
EOS%: 3.9 % (ref 0.0–7.0)
MCH: 26.4 pg — ABNORMAL LOW (ref 28.0–33.4)
MCHC: 33.8 g/dL (ref 32.0–35.9)
MONO%: 11.4 % (ref 0.0–13.0)
NEUT#: 2.3 10*3/uL (ref 1.5–6.5)
Platelets: 248 10*3/uL (ref 145–400)
RDW: 18.8 % — ABNORMAL HIGH (ref 10.5–14.6)

## 2009-05-22 ENCOUNTER — Ambulatory Visit (HOSPITAL_COMMUNITY): Admission: RE | Admit: 2009-05-22 | Discharge: 2009-05-22 | Payer: Self-pay | Admitting: Oncology

## 2009-05-26 ENCOUNTER — Encounter: Payer: Self-pay | Admitting: Cardiology

## 2009-05-28 ENCOUNTER — Ambulatory Visit: Payer: Self-pay | Admitting: Cardiology

## 2009-05-28 LAB — CONVERTED CEMR LAB: POC INR: 4.3

## 2009-06-08 ENCOUNTER — Ambulatory Visit: Payer: Self-pay | Admitting: Cardiology

## 2009-06-08 LAB — CONVERTED CEMR LAB: POC INR: 2

## 2009-07-08 ENCOUNTER — Ambulatory Visit: Payer: Self-pay | Admitting: Internal Medicine

## 2009-07-13 ENCOUNTER — Ambulatory Visit: Payer: Self-pay | Admitting: Cardiology

## 2009-07-23 ENCOUNTER — Ambulatory Visit: Payer: Self-pay | Admitting: Oncology

## 2009-07-23 ENCOUNTER — Ambulatory Visit: Payer: Self-pay | Admitting: Cardiology

## 2009-08-14 ENCOUNTER — Ambulatory Visit: Payer: Self-pay | Admitting: Cardiology

## 2009-08-14 LAB — CONVERTED CEMR LAB: POC INR: 2.2

## 2009-09-04 ENCOUNTER — Ambulatory Visit: Payer: Self-pay | Admitting: Internal Medicine

## 2009-09-23 ENCOUNTER — Ambulatory Visit: Payer: Self-pay | Admitting: Oncology

## 2009-10-23 ENCOUNTER — Ambulatory Visit: Payer: Self-pay | Admitting: Cardiovascular Disease

## 2009-10-23 LAB — CONVERTED CEMR LAB: POC INR: 3.5

## 2009-11-06 ENCOUNTER — Ambulatory Visit: Payer: Self-pay | Admitting: Cardiology

## 2009-11-27 ENCOUNTER — Ambulatory Visit: Payer: Self-pay | Admitting: Oncology

## 2009-12-01 LAB — CBC WITH DIFFERENTIAL/PLATELET
BASO%: 0.1 % (ref 0.0–2.0)
EOS%: 2.9 % (ref 0.0–7.0)
HCT: 44.4 % (ref 38.4–49.9)
LYMPH%: 23.8 % (ref 14.0–49.0)
MCH: 29.3 pg (ref 27.2–33.4)
MCHC: 34 g/dL (ref 32.0–36.0)
MCV: 86 fL (ref 79.3–98.0)
NEUT%: 66.9 % (ref 39.0–75.0)
Platelets: 188 10*3/uL (ref 140–400)

## 2009-12-02 LAB — COMPREHENSIVE METABOLIC PANEL
Albumin: 4.2 g/dL (ref 3.5–5.2)
Alkaline Phosphatase: 97 U/L (ref 39–117)
BUN: 19 mg/dL (ref 6–23)
CO2: 26 mEq/L (ref 19–32)
Glucose, Bld: 521 mg/dL — ABNORMAL HIGH (ref 70–99)
Total Bilirubin: 0.6 mg/dL (ref 0.3–1.2)

## 2009-12-04 ENCOUNTER — Ambulatory Visit: Payer: Self-pay | Admitting: Cardiovascular Disease

## 2009-12-04 ENCOUNTER — Ambulatory Visit: Payer: Self-pay | Admitting: Cardiology

## 2009-12-04 DIAGNOSIS — I119 Hypertensive heart disease without heart failure: Secondary | ICD-10-CM

## 2009-12-04 LAB — CONVERTED CEMR LAB: POC INR: 1.4

## 2009-12-08 ENCOUNTER — Ambulatory Visit (HOSPITAL_COMMUNITY)
Admission: RE | Admit: 2009-12-08 | Discharge: 2009-12-08 | Payer: Self-pay | Source: Home / Self Care | Attending: Oncology | Admitting: Oncology

## 2009-12-08 LAB — CBC WITH DIFFERENTIAL/PLATELET
Basophils Absolute: 0 10*3/uL (ref 0.0–0.1)
Eosinophils Absolute: 0.2 10*3/uL (ref 0.0–0.5)
LYMPH%: 21 % (ref 14.0–49.0)
MCV: 88.8 fL (ref 79.3–98.0)
MONO%: 7.4 % (ref 0.0–14.0)
NEUT#: 4.9 10*3/uL (ref 1.5–6.5)
NEUT%: 68.7 % (ref 39.0–75.0)
Platelets: 230 10*3/uL (ref 140–400)
RBC: 5.29 10*6/uL (ref 4.20–5.82)

## 2009-12-08 LAB — CONVERTED CEMR LAB
Calcium: 8.8 mg/dL (ref 8.4–10.5)
Creatinine, Ser: 1.5 mg/dL (ref 0.4–1.5)
GFR calc non Af Amer: 58.53 mL/min (ref >60)
Sodium: 135 meq/L (ref 135–145)

## 2009-12-18 ENCOUNTER — Ambulatory Visit: Payer: Self-pay | Admitting: Cardiology

## 2010-01-01 ENCOUNTER — Ambulatory Visit: Payer: Self-pay | Admitting: Cardiology

## 2010-01-01 LAB — CONVERTED CEMR LAB: POC INR: 1.7

## 2010-01-15 ENCOUNTER — Ambulatory Visit: Admission: RE | Admit: 2010-01-15 | Discharge: 2010-01-15 | Payer: Self-pay | Source: Home / Self Care

## 2010-01-15 LAB — CONVERTED CEMR LAB: POC INR: 1.8

## 2010-01-25 ENCOUNTER — Encounter: Payer: Self-pay | Admitting: Oncology

## 2010-01-28 ENCOUNTER — Ambulatory Visit: Admission: RE | Admit: 2010-01-28 | Discharge: 2010-01-28 | Payer: Self-pay | Source: Home / Self Care

## 2010-01-29 ENCOUNTER — Ambulatory Visit: Payer: Self-pay | Admitting: Oncology

## 2010-01-31 LAB — CONVERTED CEMR LAB
BUN: 20 mg/dL (ref 6–23)
Chloride: 103 meq/L (ref 96–112)
Creatinine, Ser: 1.42 mg/dL (ref 0.40–1.50)
Glucose, Bld: 180 mg/dL — ABNORMAL HIGH (ref 70–99)
POC INR: 2
Pro B Natriuretic peptide (BNP): 716 pg/mL — ABNORMAL HIGH (ref 0.0–100.0)

## 2010-02-03 DIAGNOSIS — I4891 Unspecified atrial fibrillation: Secondary | ICD-10-CM

## 2010-02-03 DIAGNOSIS — Z7901 Long term (current) use of anticoagulants: Secondary | ICD-10-CM | POA: Insufficient documentation

## 2010-02-04 NOTE — Medication Information (Signed)
Summary: cvrr  Anticoagulant Therapy  Managed by: Cloyde Reams, RN, BSN Referring MD: Charlies Constable, MD  PCP: Dr Yong Channel Supervising MD: Johney Frame MD, Fayrene Fearing Indication 1: Atrial Fibrillation Lab Used: [facility] [place] Site: [address] INR POC 4.1 INR RANGE 2.0 - 3.0   Dietary changes: no    Health status changes: no    Bleeding/hemorrhagic complications: no    Recent/future hospitalizations: no    Any changes in medication regimen? no    Recent/future dental: no  Any missed doses?: no       Is patient compliant with meds? yes       Allergies: No Known Drug Allergies  Anticoagulation Management History:      The patient is taking warfarin and comes in today for a routine follow up visit.  Positive risk factors for bleeding include an age of 75 years or older.  The bleeding index is 'intermediate risk'.  Positive CHADS2 values include History of CHF, History of HTN, and Age > 6 years old.  Anticoagulation responsible provider: Tanganyika Bowlds MD, Fayrene Fearing.  INR POC: 4.1.  Cuvette Lot#: [id number].  Exp: 09/2010.    Anticoagulation Management Assessment/Plan:      The patient's current anticoagulation dose is Warfarin sodium 5 mg tabs: Take as directed by coumadin clinic..  The target INR is 2.0-3.0.  The next INR is due 07/22/2009.  Anticoagulation instructions were given to patient.  Results were reviewed/authorized by Cloyde Reams, RN, BSN.  He was notified by Cloyde Reams, RN, BSN.         Prior Anticoagulation Instructions: INR 2.0  Take 1 1/2 tablets today then resume same dose of 1 tablet every day.    Current Anticoagulation Instructions: INR 4.1  Skip tomorrow's dosage of coumadin, then start taking 1 tablet daily except 1/2 tablet on Mondays.  Recheck in 2 weeks.

## 2010-02-04 NOTE — Medication Information (Signed)
Summary: rov/eac  Anticoagulant Therapy  Managed by: Bethena Midget, RN, BSN Referring MD: Charlies Constable, MD  PCP: Dr Christiana Fuchs MD: Daleen Squibb MD, Maisie Fus Indication 1: Atrial Fibrillation Lab Used: [facility] [place] Site: [address] INR POC 2.4 INR RANGE 2.0 - 3.0   Dietary changes: no    Health status changes: no    Bleeding/hemorrhagic complications: no    Recent/future hospitalizations: no    Any changes in medication regimen? no    Recent/future dental: no  Any missed doses?: no       Is patient compliant with meds? yes       Allergies: No Known Drug Allergies  Anticoagulation Management History:      The patient is taking warfarin and comes in today for a routine follow up visit.  Positive risk factors for bleeding include an age of 75 years or older.  The bleeding index is 'intermediate risk'.  Positive CHADS2 values include History of CHF, History of HTN, and Age > 35 years old.  Anticoagulation responsible provider: Daleen Squibb MD, Maisie Fus.  INR POC: 2.4.  Cuvette Lot#: [id number].  Exp: 05/2010.    Anticoagulation Management Assessment/Plan:      The patient's current anticoagulation dose is Warfarin sodium 5 mg tabs: Take as directed by coumadin clinic..  The target INR is 2.0-3.0.  The next INR is due 04/20/2009.  Anticoagulation instructions were given to patient.  Results were reviewed/authorized by Bethena Midget, RN, BSN.  He was notified by Bethena Midget, RN, BSN.         Prior Anticoagulation Instructions: INR 3.1  Start NEW dosing schedule of 1.5 tablets on Monday, Wednesday, and Friday, and 1 tablet all other days.  Return to clinic in 10 days.    Current Anticoagulation Instructions: INR 2.4 Continue 1  tablet everyday except 1.5 tablets on Mondays, Wednesdays and  Fridays. Recheck in 10 days.

## 2010-02-04 NOTE — Assessment & Plan Note (Signed)
Summary: rov   Visit Type:  Initial Consult Referring Provider:  dr Yong Channel Primary Provider:  Dr Yong Channel  CC:  a fib.  History of Present Illness: The patient is 77 years old and is seen on referral from Dr. Merla Riches for evaluation and management of atrial fibrillation and congestive heart failure. He says that he's noticed an irregular heart rate when he takes his blood pressure for the past 4 weeks. Over the past 2 weeks has had increased symptoms of cough and shortness of breath. The cough has been nonproductive. His symptoms have been somewhat progressive and he was seen today by Dr. Merla Riches who did a chest x-ray which showed cardiomegaly and a right effusion and some vascular congestion. He was also found to be in 8 to fibrillation with a rate of about 110. He has had no chest pain associated with his other symptoms.  He was in the hospital last June with a lymphoma. He developed paroxysmal atrial fibrillation in the hospital and was seen by Dr. Ladona Ridgel and he converted after one dose of flecainide. He had a 2-D echo in the hospital at that time according to Dr. Lubertha Basque note that there is nothing in the chart.  He also has a history of anemia which is microcytic and has been evaluated by Dr. Welton Flakes with oncology.  Preventive Screening-Counseling & Management  Alcohol-Tobacco     Smoking Status: quit  Caffeine-Diet-Exercise     Does Patient Exercise: yes      Drug Use:  no.    Current Medications (verified): 1)  Metformin Hcl 500 Mg Tabs (Metformin Hcl) .... Two Times A Day 2)  Pravastatin Sodium 80 Mg Tabs (Pravastatin Sodium) .... Take One Tablet By Mouth Daily At Bedtime 3)  Hydrochlorothiazide 12.5 Mg Tabs (Hydrochlorothiazide) .... Take 2 Tablet By Mouth Daily. 4)  Ferrous Sulfate 325 (65 Fe) Mg  Tabs (Ferrous Sulfate) .... Once Daily 5)  Aspirin 81 Mg Tbec (Aspirin) .... Take One Tablet By Mouth Daily  Allergies (verified): No Known Drug Allergies  Past  History:  Past Medical History: Diabetic  non hodgkin's lymphoma ATRIAL FIBRILLATION  HYPERTENSION, UNSPECIFIED    Past Surgical History: none  Family History: Reviewed history and no changes required. Family History of Coronary Artery Disease: Sister with MI, brother with MI Family History of Diabetes:   Social History: Reviewed history and no changes required. Retired from [employer] Married  Tobacco Use - Former.  Alcohol Use - no Regular Exercise - yes Drug Use - no Smoking Status:  quit Does Patient Exercise:  yes Drug Use:  no  Review of Systems       ROS is negative except as outlined in HPI.   Vital Signs:  Patient profile:   77 year old male Height:      69 inches Weight:      220 pounds BMI:     32.61 BP sitting:   142 / 86  (left arm) Cuff size:   regular  Vitals Entered By: Hardin Negus, RMA (March 06, 2009 3:43 PM)  Physical Exam  Additional Exam:  Gen. Well-nourished, in no distress   Neck: JVP 2 cm above the clavicle, thyroid not enlarged, no carotid bruits Lungs: No tachypnea, bilateral basilar rales, rhonchi or wheezes Cardiovascular: Rhythm irregular, PMI not displaced,  heart sounds  normal, no murmurs or gallops, no peripheral edema, pulses normal in all 4 extremities. Abdomen: BS normal, abdomen soft and non-tender without masses or organomegaly, no hepatosplenomegaly. MS: No deformities,  no cyanosis or clubbing   Neuro:  No focal sns   Skin:  no lesions    Problems:  Medical Problems Added: 1)  Dx of Non-hodgkin's Lymphoma  (ICD-202.80) 2)  Dx of Combined Heart Failure, Acute  (ICD-428.41) 3)  Dx of Atrial Fibrillation  (ICD-427.31) 4)  Dx of Hypertension, Unspecified  (ICD-401.9)  Impression & Recommendations:  Problem # 1:  COMBINED HEART FAILURE, ACUTE (ICD-428.41)  He has signs and symptoms of congestive heart failure with mild to moderate volume overload. We will start him on Lasix 40 mg b.i.d. for 2 days and 40 mg q.d. We  will start Coreg 12.5 mg b.i.d. both for congestive heart failure and for rate control for his atrial fibrillation. We will start Lotensin 10 mg daily. We will arrange followup next week and an echocardiogram early next week. The following medications were removed from the medication list:    Hydrochlorothiazide 12.5 Mg Tabs (Hydrochlorothiazide) .Marland Kitchen... Take 2 tablet by mouth daily. His updated medication list for this problem includes:    Aspirin 81 Mg Tbec (Aspirin) .Marland Kitchen... Take one tablet by mouth daily    Furosemide 40 Mg Tabs (Furosemide) .Marland Kitchen... Take one tablet by mouth daily and as directed    Carvedilol 12.5 Mg Tabs (Carvedilol) .Marland Kitchen... Take one tablet by mouth twice a day    Lotensin 10 Mg Tabs (Benazepril hcl) .Marland Kitchen... Take 1 tablet by mouth once a day    Aspirin Ec 325 Mg Tbec (Aspirin) .Marland Kitchen... Take one tablet by mouth daily  Orders: T-BNP  (B Natriuretic Peptide) ([id number]) Echocardiogram (Echo)  Problem # 2:  ATRIAL FIBRILLATION (ICD-427.31) He has recurrent atrial fibrillation which is probably about 4 weeks in duration. We will initiate rate control with Coreg. His baseline rate is not too fast today. He is [place] score 4 with a hypertension diabetes and congestive heart failure. We will evaluate him with a 2-D echocardiogram, blood work including a TSH. He'll need Coumadin but will hold off today until we know more about his microcytic anemia and whether he will need to undergo catheterization. His updated medication list for this problem includes:    Aspirin 81 Mg Tbec (Aspirin) .Marland Kitchen... Take one tablet by mouth daily    Carvedilol 12.5 Mg Tabs (Carvedilol) .Marland Kitchen... Take one tablet by mouth twice a day    Aspirin Ec 325 Mg Tbec (Aspirin) .Marland Kitchen... Take one tablet by mouth daily  Orders: EKG w/ Interpretation (93000)  Problem # 3:  HYPERTENSION, UNSPECIFIED (ICD-401.9) His blood pressure is somewhat elevated today we will start Lotensin and Coreg for this. The following medications were removed  from the medication list:    Hydrochlorothiazide 12.5 Mg Tabs (Hydrochlorothiazide) .Marland Kitchen... Take 2 tablet by mouth daily. His updated medication list for this problem includes:    Aspirin 81 Mg Tbec (Aspirin) .Marland Kitchen... Take one tablet by mouth daily    Furosemide 40 Mg Tabs (Furosemide) .Marland Kitchen... Take one tablet by mouth daily and as directed    Carvedilol 12.5 Mg Tabs (Carvedilol) .Marland Kitchen... Take one tablet by mouth twice a day    Lotensin 10 Mg Tabs (Benazepril hcl) .Marland Kitchen... Take 1 tablet by mouth once a day    Aspirin Ec 325 Mg Tbec (Aspirin) .Marland Kitchen... Take one tablet by mouth daily  Problem # 4:  NON-HODGKIN'S LYMPHOMA (ICD-202.80) He is a non-Hodgkin's lymphoma. He also has an anemia. 4 start Coumadin we'll try and learn more about this.  Patient Instructions: 1)  Your physician recommends that you schedule a follow-up  appointment on: Tues 03/10/09 @ 12:30pm 2)  Your physician recommends that you have lab work today: bnp (428.33;427.31) 3)  Your physician has recommended you make the following change in your medication: 1) Start lasix 40mg - take 40mg  today, on saturday and "sunday take lasix 40mg two times a day, then starting monday take lasix 40mg once daily., 2) Start lotensin (benazepril) 10mg once daily, 3) Start coreg 12.5mg two times a day, 4) Start Aspirin 325mg once daily , 5) Stop hydrochlorothiazide 4)  Your physician has requested that you have an echocardiogram on monday 03/09/09 @ 12:15pm.  Echocardiography is a painless test that uses sound waves to create images of your heart. It provides your doctor with information about the size and shape of your heart and how well your heart's chambers and valves are working.  This procedure takes approximately one hour. There are no restrictions for this procedure. Prescriptions: LOTENSIN 10 MG TABS (BENAZEPRIL HCL) Take 1 tablet by mouth once a day  #30 x 3   Entered by:   Sherri Frazier, RMA   Authorized by:    Rogers , MD, FACC   Signed by:   Sherri  Frazier, RMA on 03/06/2009   Method used:   Electronically to        [facility] [address] [id number] (retail)       [address]       [place], [place]  [address]       Ph: [phone]       Fax: [phone]   RxID:   [id number] CARVEDILOL 12.5 MG TABS (CARVEDILOL) Take one tablet by mouth twice a day  #60 x 3   Entered by:   Sherri Frazier, RMA   Authorized by:    Rogers , MD, FACC   Signed by:   Sherri Frazier, RMA on 03/06/2009   Method used:   Electronically to        [facility] [address] [id number]* (retail)       [address]       [place], [place]  [address]       Ph: [phone]       Fax: [phone]   RxID:   [id number] FUROSEMIDE 40 MG TABS (FUROSEMIDE) Take one tablet by mouth daily and as directed  #45 x 3   Entered by:   Sherri Frazier, RMA   Authorized by:    Rogers , MD, FACC   Signed by:   Sherri Frazier, RMA on 03/06/2009   Method used:   Electronically to        [facility] [id number] (retail)       [address]       [place], [place]  [address]       Ph: [phone]       Fax: [phone]   RxID:   [id number]

## 2010-02-04 NOTE — Medication Information (Signed)
Summary: rov/eac  Anticoagulant Therapy  Managed by: Weston Brass, PharmD Referring MD: Charlies Constable, MD  PCP: Dr Yong Channel Supervising MD: Antoine Poche MD, Fayrene Fearing Indication 1: Atrial Fibrillation Lab Used: [facility] [place] Site: [address] INR POC 2.0 INR RANGE 2.0 - 3.0   Dietary changes: no    Health status changes: no    Bleeding/hemorrhagic complications: no    Recent/future hospitalizations: no    Any changes in medication regimen? no    Recent/future dental: no  Any missed doses?: yes     Details: missed yesterday's dose- ran out of Coumadin and drug store was closed   Is patient compliant with meds? yes       Current Medications (verified): 1)  Ferrous Sulfate 325 (65 Fe) Mg  Tabs (Ferrous Sulfate) .... Once Daily 2)  Furosemide 40 Mg Tabs (Furosemide) .... Take One Tablet By Mouth Two Times A Day 3)  Warfarin Sodium 5 Mg Tabs (Warfarin Sodium) .... Take As Directed By Coumadin Clinic. 4)  Digoxin 0.125 Mg Tabs (Digoxin) .... Take One Tab By Mouth Once Daily 5)  Metoprolol Succinate 50 Mg Xr24h-Tab (Metoprolol Succinate) .... Take One Tablet By Mouth Daily  Allergies (verified): No Known Drug Allergies  Anticoagulation Management History:      The patient is taking warfarin and comes in today for a routine follow up visit.  Positive risk factors for bleeding include an age of 75 years or older.  The bleeding index is 'intermediate risk'.  Positive CHADS2 values include History of CHF, History of HTN, and Age > 75 years old.  Anticoagulation responsible provider: Antoine Poche MD, Fayrene Fearing.  INR POC: 2.0.  Cuvette Lot#: [id number].  Exp: 08/2010.    Anticoagulation Management Assessment/Plan:      The patient's current anticoagulation dose is Warfarin sodium 5 mg tabs: Take as directed by coumadin clinic..  The target INR is 2.0-3.0.  The next INR is due 06/25/2009.  Anticoagulation instructions were given to patient.  Results were reviewed/authorized by Weston Brass, PharmD.  He was notified by Weston Brass PharmD.         Prior Anticoagulation Instructions: INR 4.3  Do NOT take coumadin tomorrow, and take 1/2 tablet on Friday.   Then start NEW dosing schedule of 1 tablet everyday.  Return to clinic in 10 days.  Notify us when you have made a decision regarding Pradaxa so we can provide further instructions.    Current Anticoagulation Instructions: INR 2.0  Take 1 1/2 tablets today then resume same dose of 1 tablet every day.

## 2010-02-04 NOTE — Medication Information (Signed)
Summary: ccr/hm  Anticoagulant Therapy  Managed by: Eda Keys, PharmD Referring MD: Charlies Constable, MD  PCP: Dr Christiana Fuchs MD: Daleen Squibb MD, Maisie Fus Indication 1: Atrial Fibrillation Lab Used: [facility] [place] Site: [address] INR POC 4.3 INR RANGE 2.0 - 3.0   Dietary changes: no    Health status changes: yes       Details: cardioversion not effective--started amiordarone and will be d/c ed today, and patient will start on digoxin and metoprolol --possibly to start pradaxa  Bleeding/hemorrhagic complications: no    Recent/future hospitalizations: yes       Details: failed cardioversion --statted amiordarone but will be d/c today and possibly starting pradaxa soon  Any changes in medication regimen? yes       Details: d/c amiordarone  Recent/future dental: no  Any missed doses?: no       Is patient compliant with meds? yes      Comments: Contact insurance regarding cost pradaxa and notify us when decision is made regarding switching medications.    Allergies: No Known Drug Allergies  Anticoagulation Management History:      The patient is taking warfarin and comes in today for a routine follow up visit.  Positive risk factors for bleeding include an age of 75 years or older.  The bleeding index is 'intermediate risk'.  Positive CHADS2 values include History of CHF, History of HTN, and Age > 58 years old.  Anticoagulation responsible provider: Daleen Squibb MD, Maisie Fus.  INR POC: 4.3.  Cuvette Lot#: [id number].  Exp: 08/2010.    Anticoagulation Management Assessment/Plan:      The patient's current anticoagulation dose is Warfarin sodium 5 mg tabs: Take as directed by coumadin clinic..  The target INR is 2.0-3.0.  The next INR is due 06/08/2009.  Anticoagulation instructions were given to patient.  Results were reviewed/authorized by Eda Keys, PharmD.  He was notified by Eda Keys, PharmD.         Prior Anticoagulation Instructions: INR-3.4  Dose  adjustment. Take 1/2 a tablet tomorrow , then take 1.5 tablets on Friday of each week and 1 tablet on all other days. Return on 05/26/09. Bethena Midget, RN, BSN  May 15, 2009 11:57 AM   Current Anticoagulation Instructions: INR 4.3  Do NOT take coumadin tomorrow, and take 1/2 tablet on Friday.   Then start NEW dosing schedule of 1 tablet everyday.  Return to clinic in 10 days.  Notify us when you have made a decision regarding Pradaxa so we can provide further instructions.

## 2010-02-04 NOTE — Medication Information (Signed)
Summary: rov/tm  Anticoagulant Therapy  Managed by: Weston Brass, PharmD Referring MD: Charlies Constable, MD  PCP: Dr Christiana Fuchs MD: Excell Seltzer MD, Casimiro Needle Indication 1: Atrial Fibrillation Lab Used: [facility] [place] Site: [address] INR POC 1.4 INR RANGE 2.0 - 3.0   Dietary changes: no    Health status changes: no    Bleeding/hemorrhagic complications: no    Recent/future hospitalizations: no    Any changes in medication regimen? no    Recent/future dental: no  Any missed doses?: yes     Details: missed a few doses this week because out of medication     Allergies: No Known Drug Allergies  Anticoagulation Management History:      The patient is taking warfarin and comes in today for a routine follow up visit.  Positive risk factors for bleeding include an age of 62 years or older.  The bleeding index is 'intermediate risk'.  Positive CHADS2 values include History of CHF, History of HTN, and Age > 57 years old.  Anticoagulation responsible provider: Excell Seltzer MD, Casimiro Needle.  INR POC: 1.4.  Cuvette Lot#: [id number].  Exp: 12/2010.    Anticoagulation Management Assessment/Plan:      The patient's current anticoagulation dose is Warfarin sodium 5 mg tabs: Take as directed by coumadin clinic..  The target INR is 2.0-3.0.  The next INR is due 12/18/2009.  Anticoagulation instructions were given to patient.  Results were reviewed/authorized by Weston Brass, PharmD.  He was notified by Weston Brass PharmD.         Prior Anticoagulation Instructions: INR 2.0  Continue on same dosage 1 tablet daily except 1/2 tablet on Tuesdays and Saturdays.  Return in 4 weeks.    Current Anticoagulation Instructions: INR 1.4  Take an extra tablet today, 1 1/2 tablets tomorrow then resume same dose of 1 tablet every day except 1/2 tablet on Tuesday and Saturday.  Recheck INR 2 weeks.

## 2010-02-04 NOTE — Medication Information (Signed)
Summary: rov/tm  Anticoagulant Therapy  Managed by: Weston Brass, PharmD Referring MD: Charlies Constable, MD  PCP: Dr Christiana Fuchs MD: Tenny Craw MD, Gunnar Fusi Indication 1: Atrial Fibrillation Lab Used: [facility] [place] Site: [address] INR POC 2.4 INR RANGE 2.0 - 3.0   Dietary changes: no    Health status changes: no    Bleeding/hemorrhagic complications: no    Recent/future hospitalizations: no    Any changes in medication regimen? no    Recent/future dental: yes     Details: Tooth extraction.  Any missed doses?: no       Is patient compliant with meds? yes       Allergies (verified): No Known Drug Allergies  Anticoagulation Management History:      Positive risk factors for bleeding include an age of 75 years or older.  The bleeding index is 'intermediate risk'.  Positive CHADS2 values include History of CHF, History of HTN, and Age > 47 years old.  Anticoagulation responsible Yoselin Amerman: Tenny Craw MD, Gunnar Fusi.  INR POC: 2.4.  Cuvette Lot#: [id number].  Exp: 10/2010.    Anticoagulation Management Assessment/Plan:      The patient's current anticoagulation dose is Warfarin sodium 5 mg tabs: Take as directed by coumadin clinic..  The target INR is 2.0-3.0.  The next INR is due 10/02/2009.  Anticoagulation instructions were given to patient.  Results were reviewed/authorized by Weston Brass, PharmD.  He was notified by Kennieth Francois.         Prior Anticoagulation Instructions: INR 2.2  Continue with 1 tablet daily except 1/2 tablet on Tue and Sat.  Return to clinic in 3 weeks.    Current Anticoagulation Instructions: INR 2.4  Continue taking one tablet every day except for one-half tablet on Tuesdays and Saturdays.  We will recheck your INR in four weeks.

## 2010-02-04 NOTE — Medication Information (Signed)
Summary: rov/kb  Anticoagulant Therapy  Managed by: Eda Keys, PharmD Referring MD: Charlies Constable, MD  PCP: Dr Christiana Fuchs MD: Antoine Poche MD, Fayrene Fearing Indication 1: Atrial Fibrillation Lab Used: [facility] [place] Site: [address] INR POC 3.4 INR RANGE 2.0 - 3.0   Dietary changes: no    Health status changes: yes       Details: Patient has had a few episodes of hypotension (93/50),pt generally takes 1/2 tablet of coreg, he skipped his coreg and BP increased to normal  Bleeding/hemorrhagic complications: no    Recent/future hospitalizations: no    Any changes in medication regimen? no    Recent/future dental: no  Any missed doses?: no       Is patient compliant with meds? yes      Comments: Possible cardioversion; however, this has not been scheduled as of yet.    Allergies: No Known Drug Allergies  Anticoagulation Management History:      The patient is taking warfarin and comes in today for a routine follow up visit.  Positive risk factors for bleeding include an age of 75 years or older.  The bleeding index is 'intermediate risk'.  Positive CHADS2 values include History of CHF, History of HTN, and Age > 42 years old.  Anticoagulation responsible provider: Antoine Poche MD, Fayrene Fearing.  INR POC: 3.4.  Cuvette Lot#: [id number].  Exp: 08/2010.    Anticoagulation Management Assessment/Plan:      The patient's current anticoagulation dose is Warfarin sodium 5 mg tabs: Take as directed by coumadin clinic..  The target INR is 2.0-3.0.  The next INR is due 05/22/2009.  Anticoagulation instructions were given to patient.  Results were reviewed/authorized by Eda Keys, PharmD.  He was notified by Eda Keys.         Prior Anticoagulation Instructions: INR 3.8 Hold coumadin dose tomorrow.Continue normal schedule on Sunday Take 1 tablet daily except take 1.5 on Monday and Friday of each week.Return in  1 week.  Current Anticoagulation Instructions: INR  3.4  Skip tomorrow's dose of coumadin.  Then start NEW dosing schedule of 1.5 tablets on Friday and 1 tablet all other days.  Return to clinic in 1 week.    Appended Document: Coumadin Clinic    Anticoagulant Therapy  Managed by: Eda Keys, PharmD Referring MD: Charlies Constable, MD  PCP: Dr Christiana Fuchs MD: Antoine Poche MD, Fayrene Fearing Indication 1: Atrial Fibrillation Lab Used: [facility] [place] Site: [address] INR RANGE 2.0 - 3.0           Comments: Patient seen Dr Juanda Chance 05/15/09, decision to DCCV on 05/19/09.  Allergies: No Known Drug Allergies  Anticoagulation Management History:      Positive risk factors for bleeding include an age of 74 years or older.  The bleeding index is 'intermediate risk'.  Positive CHADS2 values include History of CHF, History of HTN, and Age > 34 years old.  Anticoagulation responsible provider: Antoine Poche MD, Fayrene Fearing.  Exp: 08/2010.    Anticoagulation Management Assessment/Plan:      The patient's current anticoagulation dose is Warfarin sodium 5 mg tabs: Take as directed by coumadin clinic..  The target INR is 2.0-3.0.  The next INR is due 05/26/2009.  Anticoagulation instructions were given to patient.  Results were reviewed/authorized by Eda Keys, PharmD.         Prior Anticoagulation Instructions: INR 3.4  Skip tomorrow's dose of coumadin.  Then start NEW dosing schedule of 1.5 tablets on Friday and 1  tablet all other days.  Return to clinic in 1 week.    Current Anticoagulation Instructions: INR-3.4  Dose adjustment. Take 1/2 a tablet tomorrow , then take 1.5 tablets on Friday of each week and 1 tablet on all other days. Return on 05/26/09. Bethena Midget, RN, BSN  May 15, 2009 11:57 AM

## 2010-02-04 NOTE — Medication Information (Signed)
Summary: rov/ewj  Anticoagulant Therapy  Managed by: Eda Keys, PharmD Referring MD: Charlies Constable, MD  PCP: Dr Yong Channel Supervising MD: Gala Romney MD, Reuel Boom Indication 1: Atrial Fibrillation Lab Used: [facility] [place] Site: [address] INR POC 3.1 INR RANGE 2.0 - 3.0   Dietary changes: no    Health status changes: no    Bleeding/hemorrhagic complications: no    Recent/future hospitalizations: no    Any changes in medication regimen? no    Recent/future dental: no  Any missed doses?: no       Is patient compliant with meds? yes       Current Medications (verified): 1)  Ferrous Sulfate 325 (65 Fe) Mg  Tabs (Ferrous Sulfate) .... Once Daily 2)  Furosemide 40 Mg Tabs (Furosemide) .... Take One Tablet By Mouth Two Times A Day 3)  Carvedilol 12.5 Mg Tabs (Carvedilol) .... Take One Tablet By Mouth Twice A Day 4)  Lotensin 10 Mg Tabs (Benazepril Hcl) .... Take 1 Tablet By Mouth Two Times A Day 5)  Warfarin Sodium 5 Mg Tabs (Warfarin Sodium) .... Take As Directed By Coumadin Clinic.  Allergies (verified): No Known Drug Allergies  Anticoagulation Management History:      The patient is taking warfarin and comes in today for a routine follow up visit.  Positive risk factors for bleeding include an age of 75 years or older.  The bleeding index is 'intermediate risk'.  Positive CHADS2 values include History of CHF, History of HTN, and Age > 75 years old.  Anticoagulation responsible provider: Veldon Wager MD, Reuel Boom.  INR POC: 3.1.  Cuvette Lot#: [id number].  Exp: 05/2010.    Anticoagulation Management Assessment/Plan:      The patient's current anticoagulation dose is Warfarin sodium 5 mg tabs: Take as directed by coumadin clinic..  The target INR is 2.0-3.0.  The next INR is due 04/10/2009.  Anticoagulation instructions were given to patient.  Results were reviewed/authorized by Eda Keys, PharmD.  He was notified by Eda Keys.         Prior  Anticoagulation Instructions: INR 2.2  Continue on same dosage 1.5 tablets daily except 1 tablet on Mondays, Wednesdays, and Fridays.  Recheck in 1 week.    Current Anticoagulation Instructions: INR 3.1  Start NEW dosing schedule of 1.5 tablets on Monday, Wednesday, and Friday, and 1 tablet all other days.  Return to clinic in 10 days.   Prescriptions: WARFARIN SODIUM 5 MG TABS (WARFARIN SODIUM) Take as directed by coumadin clinic.  #45 x 3   Entered by:   Eda Keys   Authorized by:   Lenoria Farrier, MD, [facility]   Signed by:   Eda Keys on 03/31/2009   Method used:   Electronically to        [facility] [address] [id number]* (retail)       [address] [place], [place]  [address]       Ph: [phone]       Fax: [phone]   RxID:   [id number]

## 2010-02-04 NOTE — Medication Information (Signed)
Summary: Coumadin Clinic  Anticoagulant Therapy  Managed by: Cloyde Reams, RN, BSN Referring MD: Charlies Constable, MD  PCP: Dr Christiana Fuchs MD: Juanda Chance MD, Sadiyah Kangas Indication 1: Atrial Fibrillation Lab Used: [facility] [place] Site: [address] INR POC 3.2 INR RANGE 2.0 - 3.0   Dietary changes: no    Health status changes: no    Bleeding/hemorrhagic complications: no    Recent/future hospitalizations: no    Any changes in medication regimen? no    Recent/future dental: no  Any missed doses?: no       Is patient compliant with meds? yes       Allergies (verified): No Known Drug Allergies  Anticoagulation Management History:      The patient is taking warfarin and comes in today for a routine follow up visit.  Positive risk factors for bleeding include an age of 75 years or older.  The bleeding index is 'intermediate risk'.  Positive CHADS2 values include History of CHF, History of HTN, and Age > 91 years old.  Anticoagulation responsible provider: Juanda Chance MD, Smitty Cords.  INR POC: 3.2.  Cuvette Lot#: [id number].  Exp: 05/2010.    Anticoagulation Management Assessment/Plan:      The patient's current anticoagulation dose is Warfarin sodium 5 mg tabs: Take as directed by coumadin clinic..  The target INR is 2.0-3.0.  The next INR is due 04/27/2009.  Anticoagulation instructions were given to patient.  Results were reviewed/authorized by Cloyde Reams, RN, BSN.  He was notified by Cloyde Reams RN.         Prior Anticoagulation Instructions: INR 3.2  Take 1 tablet today, then start taking 1 tablet daily except 1.5 tablets on Mondays and Fridays.  Recheck in 1 week.  Pending cardioversion.     Current Anticoagulation Instructions: INR 3.2  Take 1/2 tablet tomorrow, then start taking 1 tablet daily except 1.5 tablets on Mondays and Fridays.  Recheck in 1 week.  Pending Cardioversion.

## 2010-02-04 NOTE — Medication Information (Signed)
Summary: rov/ewj  Anticoagulant Therapy  Managed by: Weston Brass, PharmD Referring MD: Charlies Constable, MD  PCP: Dr Christiana Fuchs MD: Juanda Chance MD, Elianie Hubers Indication 1: Atrial Fibrillation Lab Used: [facility] [place] Site: [address] INR POC 2.5 INR RANGE 2.0 - 3.0   Dietary changes: no    Health status changes: no    Bleeding/hemorrhagic complications: no    Recent/future hospitalizations: no    Any changes in medication regimen? no    Recent/future dental: no  Any missed doses?: no       Is patient compliant with meds? yes      Comments: Pending DCCV  Allergies: No Known Drug Allergies  Anticoagulation Management History:      The patient is taking warfarin and comes in today for a routine follow up visit.  Positive risk factors for bleeding include an age of 75 years or older.  The bleeding index is 'intermediate risk'.  Positive CHADS2 values include History of CHF, History of HTN, and Age > 45 years old.  Anticoagulation responsible provider: Juanda Chance MD, Smitty Cords.  INR POC: 2.5.  Cuvette Lot#: [id number].  Exp: 06/2010.    Anticoagulation Management Assessment/Plan:      The patient's current anticoagulation dose is Warfarin sodium 5 mg tabs: Take as directed by coumadin clinic..  The target INR is 2.0-3.0.  The next INR is due 05/08/2009.  Anticoagulation instructions were given to patient.  Results were reviewed/authorized by Weston Brass, PharmD.  He was notified by Weston Brass PharmD.         Prior Anticoagulation Instructions: INR 2.2  Continue on same dosage.  Recheck in 1 week.    Current Anticoagulation Instructions: INR 2.5  Continue saem dose of 1 tablet every day except 1 1/2 tablets on Monday and Friday

## 2010-02-04 NOTE — Progress Notes (Signed)
----   Converted from flag ---- ---- 05/19/2009 6:21 PM, Sherri Rad, RN, BSN wrote: The pt was cardioverted today and Dr. Juanda Chance decreased his amiodarone to 200mg  once daily from 400mg  once daily ------------------------------

## 2010-02-04 NOTE — Letter (Signed)
Summary: Cardioversion/TEE Instructions  Architectural technologist, Main Office  [address]   [place], [place] [address]   Phone: [phone]  Fax: [phone]    Cardioversion / TEE Cardioversion Instructions  You are scheduled for a Cardioversion on Tues.05/19/09 with Dr. Juanda Chance.   Please arrive at the [facility] at 12:00 p.m. on the day of your procedure.  1)   DIET:  May have clear liquid breakfast, then nothing to eat or drink after 8:00 a.m.      Clear liquids include:  water, broth, Sprite, Ginger Ale, black coffee, tea (no sugar),      cranberry / grape / apple juice, jello (not red), popsicle from clear juices (not red).  2)   Come to the [place] office on ___N/A___ for lab work. The lab at [facility] is open from 8:30 a.m. to 1:30 p.m. and 2:30 p.m. to 5:00 p.m. The lab at [facility] is open from 7:30 a.m. to 5:30 p.m. You do not have to be fasting.  3)   MAKE SURE YOU TAKE YOUR COUMADIN.  4)   A)   DO NOT TAKE these medications before your procedure:      ___________________________________________________________________     ___________________________________________________________________     ___________________________________________________________________  B)   YOU MAY TAKE ALL of your remaining medications with a small amount of water.    C)   START NEW medications:       ___________________________________________________________________     ___________________________________________________________________  5)  Must have a responsible person to drive you home.  6)   Bring a current list of your medications and current insurance cards.   * Special Note:  Every effort is made to have your procedure done on time. Occasionally there are emergencies that present themselves at the hospital that may cause delays. Please be patient if a delay does occur.  * If you have any questions after you get  home, please call the office at [phone].

## 2010-02-04 NOTE — Medication Information (Signed)
Summary: rov/tm  Anticoagulant Therapy  Managed by: Cloyde Reams, RN, BSN Referring MD: Charlies Constable, MD  PCP: Dr Christiana Fuchs MD: Juanda Chance MD, Johnie Stadel Indication 1: Atrial Fibrillation Lab Used: [facility] [place] Site: [address] INR POC 3.2 INR RANGE 2.0 - 3.0   Dietary changes: no    Health status changes: no    Bleeding/hemorrhagic complications: no    Recent/future hospitalizations: no    Any changes in medication regimen? yes       Details: Starting Amiodarone 400mg  daily x1 week then 200mg  daily after.  Planning DCCV in 2-3 weeks.    Recent/future dental: no  Any missed doses?: no       Is patient compliant with meds? yes       Allergies: No Known Drug Allergies  Anticoagulation Management History:      The patient is taking warfarin and comes in today for a routine follow up visit.  Positive risk factors for bleeding include an age of 75 years or older.  The bleeding index is 'intermediate risk'.  Positive CHADS2 values include History of CHF, History of HTN, and Age > 75 years old.  Anticoagulation responsible provider: Juanda Chance MD, Smitty Cords.  INR POC: 3.2.  Cuvette Lot#: [id number].  Exp: 05/2010.    Anticoagulation Management Assessment/Plan:      The patient's current anticoagulation dose is Warfarin sodium 5 mg tabs: Take as directed by coumadin clinic..  The target INR is 2.0-3.0.  The next INR is due 04/27/2009.  Anticoagulation instructions were given to patient.  Results were reviewed/authorized by Cloyde Reams, RN, BSN.  He was notified by Cloyde Reams RN.         Prior Anticoagulation Instructions: INR 2.4 Continue 1  tablet everyday except 1.5 tablets on Mondays, Wednesdays and  Fridays. Recheck in 10 days.   Current Anticoagulation Instructions: INR 3.2  Take 1 tablet today, then start taking 1 tablet daily except 1.5 tablets on Mondays and Fridays.  Recheck in 1 week.  Pending cardioversion.

## 2010-02-04 NOTE — Medication Information (Signed)
Summary: rov/sp  Anticoagulant Therapy  Managed by: Louann Sjogren, PharmD Referring MD: Charlies Constable, MD  PCP: Dr Christiana Fuchs MD: Daleen Squibb MD,Thomas Indication 1: Atrial Fibrillation Lab Used: [facility] [place] Site: [address] INR POC 1.6 INR RANGE 2.0 - 3.0   Dietary changes: yes       Details: Trying to reduce sugars (diabetic) so recent diet changes; still 2-3 servings of greens per week  Health status changes: no    Bleeding/hemorrhagic complications: no    Recent/future hospitalizations: no    Any changes in medication regimen? yes       Details: Took an antibiotic (unknown) for a tooth abscess- finished 2 days ago  Recent/future dental: no  Any missed doses?: no       Is patient compliant with meds? yes      Comments: Started metformin again  Allergies: No Known Drug Allergies  Anticoagulation Management History:      The patient is taking warfarin and comes in today for a routine follow up visit.  Positive risk factors for bleeding include an age of 33 years or older.  The bleeding index is 'intermediate risk'.  Positive CHADS2 values include History of CHF, History of HTN, and Age > 28 years old.  Today's INR is 1.5.  Anticoagulation responsible Garrus Gauthreaux: Wall MD,Thomas.  INR POC: 1.6.  Cuvette Lot#: [id number].  Exp: 12/2010.    Anticoagulation Management Assessment/Plan:      The patient's current anticoagulation dose is Warfarin sodium 5 mg tabs: Take as directed by coumadin clinic..  The target INR is 2.0-3.0.  The next INR is due 01/01/2010.  Anticoagulation instructions were given to patient.  Results were reviewed/authorized by Louann Sjogren, PharmD.         Prior Anticoagulation Instructions: INR 1.4  Take an extra tablet today, 1 1/2 tablets tomorrow then resume same dose of 1 tablet every day except 1/2 tablet on Tuesday and Saturday.  Recheck INR 2 weeks.   Current Anticoagulation Instructions: INR 1.5 (goal 2-3)  Take  an extra 1/2 tablet today (12/16).  Then take 1 tablet everyday except take 1/2 tablet on Tuesdays.  Return for INR check in 2 weeks on Friday, December 30th at 9:00AM.

## 2010-02-04 NOTE — Medication Information (Signed)
Summary: rov/tm  Anticoagulant Therapy  Managed by: Bethena Midget, RN, BSN Referring MD: Charlies Constable, MD  PCP: Dr Christiana Fuchs MD: Patty Sermons, MD Indication 1: Atrial Fibrillation Lab Used: [facility] [place] Site: [address] INR POC 1.8 INR RANGE 2.0 - 3.0   Dietary changes: no    Health status changes: no    Bleeding/hemorrhagic complications: no    Recent/future hospitalizations: no    Any changes in medication regimen? no    Recent/future dental: no  Any missed doses?: no       Is patient compliant with meds? yes       Allergies: No Known Drug Allergies  Anticoagulation Management History:      The patient is taking warfarin and comes in today for a routine follow up visit.  Positive risk factors for bleeding include an age of 65 years or older.  The bleeding index is 'intermediate risk'.  Positive CHADS2 values include History of CHF, History of HTN, and Age > 36 years old.  His last INR was 1.5.  Anticoagulation responsible provider: Patty Sermons, MD.  INR POC: 1.8.  Cuvette Lot#: [id number].  Exp: 02/2011.    Anticoagulation Management Assessment/Plan:      The patient's current anticoagulation dose is Warfarin sodium 5 mg tabs: Take as directed by coumadin clinic..  The target INR is 2.0-3.0.  The next INR is due 01/29/2010.  Anticoagulation instructions were given to patient/spouse.  Results were reviewed/authorized by Bethena Midget, RN, BSN.  He was notified by Bethena Midget, RN, BSN.         Prior Anticoagulation Instructions: INR 1.7 Today take extra 1/2 pill then change dose to 1 pill everyday. Recheck in 2 weeks.   Current Anticoagulation Instructions: INR 1.8 Today take extra 1/2 pill then change dose to 1 pill everyday except 1.5 pills on Mondays and Fridays. Recheck in 2 weeks.

## 2010-02-04 NOTE — Assessment & Plan Note (Signed)
Summary: 2wk f/u    Visit Type:  2wkf/w Referring Provider:  dr Yong Channel Primary Provider:  Dr Yong Channel   History of Present Illness: The patient is 77 years old and return for management of atrial fibrillation and CHF. He presented with symptoms of shortness of breath and signs of congestive heart failure and new onset atrial fibrillation. We did an echocardiogram which showed an ejection fraction of 60% and a mean aortic valve gradient of 15 mm of mercury. We had rate control medications and Coumadin and diuretics and he has improved. He says now he does not have much shortness of breath and has had no fluid retention and he can tell.  Problems Prior to Update: 1)  Non-hodgkin's Lymphoma  (ICD-202.80) 2)  Combined Heart Failure, Acute  (ICD-428.41) 3)  Atrial Fibrillation  (ICD-427.31) 4)  Hypertension, Unspecified  (ICD-401.9)  Current Medications (verified): 1)  Ferrous Sulfate 325 (65 Fe) Mg  Tabs (Ferrous Sulfate) .... Once Daily 2)  Furosemide 40 Mg Tabs (Furosemide) .... Take One Tablet By Mouth Two Times A Day 3)  Carvedilol 12.5 Mg Tabs (Carvedilol) .... Take One Tablet By Mouth Twice A Day 4)  Lotensin 10 Mg Tabs (Benazepril Hcl) .... Take 1 Tablet By Mouth Two Times A Day 5)  Warfarin Sodium 5 Mg Tabs (Warfarin Sodium) .... One Tab By Mouth Once Daily As Directed Mon ,wed. and Fri. One Tab and The Others Days 7.5 Mgs  Allergies (verified): No Known Drug Allergies  Past History:  Past Medical History: Reviewed history from 03/06/2009 and no changes required. Diabetic  non hodgkin's lymphoma ATRIAL FIBRILLATION  HYPERTENSION, UNSPECIFIED    Review of Systems       ROS is negative except as outlined in HPI.   Vital Signs:  Patient profile:   77 year old male Height:      69 inches Weight:      218 pounds Pulse rate:   88 / minute BP sitting:   124 / 64  (left arm)  Vitals Entered By: Oswald Hillock (March 27, 2009 3:13 PM)  Physical Exam  Additional  Exam:  Gen. Well-nourished, in no distress   Neck: jugular venous pulse just above the clavicle, thyroid not enlarged, no carotid bruits Lungs: No tachypnea, clear without rales, rhonchi or wheezes Cardiovascular: Rhythm irregular, PMI not displaced,  heart sounds  normal, grade 2/6 systolic ejection murmur at the left sternal edge, no peripheral edema, pulses normal in all 4 extremities. Abdomen: BS normal, abdomen soft and non-tender without masses or organomegaly, no hepatosplenomegaly. MS: No deformities, no cyanosis or clubbing   Neuro:  No focal sns   Skin:  no lesions    Impression & Recommendations:  Problem # 1:  COMBINED HEART FAILURE, ACUTE (ICD-428.41) He has diastolic heart failure but appears well compensated at present. He does have minimal JVD. We will continue his current therapy. The following medications were removed from the medication list:    Aspirin 81 Mg Tbec (Aspirin) .Marland Kitchen... Take one tablet by mouth daily His updated medication list for this problem includes:    Furosemide 40 Mg Tabs (Furosemide) .Marland Kitchen... Take one tablet by mouth two times a day    Carvedilol 12.5 Mg Tabs (Carvedilol) .Marland Kitchen... Take one tablet by mouth twice a day    Lotensin 10 Mg Tabs (Benazepril hcl) .Marland Kitchen... Take 1 tablet by mouth two times a day    Warfarin Sodium 5 Mg Tabs (Warfarin sodium) ..... One tab by mouth once daily as directed  mon ,wed. and fri. one tab and the others days 7.5 mgs  Problem # 2:  ATRIAL FIBRILLATION (ICD-427.31) He has recent onset atrial fibrillation. His rate is now well controlled. We will plan to try and restore sinus rhythm. As soon as his INR is therapeutic for 3 weeks we'll start amiodarone and then 3 weeks later we'll plan DC cardioversion if he has not converted. The following medications were removed from the medication list:    Aspirin 81 Mg Tbec (Aspirin) .Marland Kitchen... Take one tablet by mouth daily His updated medication list for this problem includes:    Carvedilol 12.5 Mg  Tabs (Carvedilol) .Marland Kitchen... Take one tablet by mouth twice a day    Warfarin Sodium 5 Mg Tabs (Warfarin sodium) ..... One tab by mouth once daily as directed mon ,wed. and fri. one tab and the others days 7.5 mgs  Orders: T-Basic Metabolic Panel [id number]) EKG w/ Interpretation (93000)  Problem # 3:  HYPERTENSION, UNSPECIFIED (ICD-401.9) This is well controlled on current medications. The following medications were removed from the medication list:    Aspirin 81 Mg Tbec (Aspirin) .Marland Kitchen... Take one tablet by mouth daily His updated medication list for this problem includes:    Furosemide 40 Mg Tabs (Furosemide) .Marland Kitchen... Take one tablet by mouth two times a day    Carvedilol 12.5 Mg Tabs (Carvedilol) .Marland Kitchen... Take one tablet by mouth twice a day    Lotensin 10 Mg Tabs (Benazepril hcl) .Marland Kitchen... Take 1 tablet by mouth two times a day  Patient Instructions: 1)  Your physician recommends that you schedule a follow-up appointment in: 4 weeks. 2)  Your physician recommends that you have lab work today: bmet (427.31)

## 2010-02-04 NOTE — Medication Information (Signed)
Summary: coumadin ck/mt  Anticoagulant Therapy  Managed by: Cloyde Reams, RN, BSN Referring MD: Charlies Constable, MD  PCP: Dr Christiana Fuchs MD: Veona Bittman Indication 1: Atrial Fibrillation Lab Used: [facility] [place] Site: [address] INR POC 3.5 INR RANGE 2.0 - 3.0   Dietary changes: no    Health status changes: no    Bleeding/hemorrhagic complications: no    Recent/future hospitalizations: no    Any changes in medication regimen? no    Recent/future dental: no  Any missed doses?: no       Is patient compliant with meds? yes       Allergies: No Known Drug Allergies  Anticoagulation Management History:      The patient is taking warfarin and comes in today for a routine follow up visit.  Positive risk factors for bleeding include an age of 75 years or older.  The bleeding index is 'intermediate risk'.  Positive CHADS2 values include History of CHF, History of HTN, and Age > 55 years old.  Anticoagulation responsible provider: Azan Maneri.  INR POC: 3.5.  Cuvette Lot#: [id number].  Exp: 11/2010.    Anticoagulation Management Assessment/Plan:      The patient's current anticoagulation dose is Warfarin sodium 5 mg tabs: Take as directed by coumadin clinic..  The target INR is 2.0-3.0.  The next INR is due 11/06/2009.  Anticoagulation instructions were given to patient.  Results were reviewed/authorized by Cloyde Reams, RN, BSN.  He was notified by Haynes Hoehn, PharmD Candidate.         Prior Anticoagulation Instructions: INR 2.4  Continue taking one tablet every day except for one-half tablet on Tuesdays and Saturdays.  We will recheck your INR in four weeks.  Current Anticoagulation Instructions: INR 3.5  Skip tomorrow's dose.  Then resume normal Coumadin schedule:  1 tablet daily except 1/2 tablet on Tuesday and Saturday.  Return to clinic in 3 weeks.

## 2010-02-04 NOTE — Assessment & Plan Note (Signed)
Summary: 4 month rov   Visit Type:  Follow-up Referring Taneshia Lorence:  dr Yong Channel Primary Taahir Grisby:  Dr Yong Channel  CC:  no complaints.  History of Present Illness: The patient is 77 years old and return for management of atrial fibrillation and diastolic CHF. He developed paroxysmal atrial fibrillation and we treated him with amiodarone followed by DC cardioversion. Unfortunately he reverted to atrial fibrillation on his last visit. We stop the amiodarone and are managing his atrial fibrillation with rate control with digoxin and Toprol and Coumadin.  He has done well since his last visit. He has had no shortness of breath, chest pain, palpitations or swelling.  Current Medications (verified): 1)  Ferrous Sulfate 325 (65 Fe) Mg  Tabs (Ferrous Sulfate) .... Out 2)  Furosemide 40 Mg Tabs (Furosemide) .... Take One Tablet By Mouth Once Daily 3)  Warfarin Sodium 5 Mg Tabs (Warfarin Sodium) .... Take As Directed By Coumadin Clinic. 4)  Digoxin 0.125 Mg Tabs (Digoxin) .... Take One Tab By Mouth Once Daily 5)  Metoprolol Succinate 50 Mg Xr24h-Tab (Metoprolol Succinate) .... Take One Tablet By Mouth Daily  Allergies (verified): No Known Drug Allergies  Past History:  Past Medical History: Reviewed history from 03/06/2009 and no changes required. Diabetic  non hodgkin's lymphoma ATRIAL FIBRILLATION  HYPERTENSION, UNSPECIFIED    Review of Systems       ROS is negative except as outlined in HPI.   Vital Signs:  Patient profile:   77 year old male Height:      69 inches Weight:      207 pounds BMI:     30.68 Pulse rate:   94 / minute BP sitting:   134 / 76  (left arm) Cuff size:   large  Vitals Entered By: Hardin Negus, RMA (November 06, 2009 4:56 PM)  Physical Exam  Additional Exam:  Gen. Well-nourished, in no distress   Neck: No JVD, thyroid not enlarged, no carotid bruits Lungs: No tachypnea, clear without rales, rhonchi or wheezes Cardiovascular: Rhythm irregular, PMI not  displaced,  heart sounds  normal, no murmurs or gallops, no peripheral edema, pulses normal in all 4 extremities. Abdomen: BS normal, abdomen soft and non-tender without masses or organomegaly, no hepatosplenomegaly. MS: No deformities, no cyanosis or clubbing   Neuro:  No focal sns   Skin:  no lesions    Impression & Recommendations:  Problem # 1:  ATRIAL FIBRILLATION (ICD-427.31) His atrial fibrillation is now chronic and is managed with Coumadin and rate control. This appears stable. He has a [place] vasc score of 4. His updated medication list for this problem includes:    Warfarin Sodium 5 Mg Tabs (Warfarin sodium) .Marland Kitchen... Take as directed by coumadin clinic.    Digoxin 0.125 Mg Tabs (Digoxin) .Marland Kitchen... Take one tab by mouth once daily    Metoprolol Succinate 50 Mg Xr24h-tab (Metoprolol succinate) .Marland Kitchen... Take one tablet by mouth daily  Orders: EKG w/ Interpretation (93000)  Problem # 2:  HYPERTENSION, UNSPECIFIED (ICD-401.9) This is well controlled on current medications. His updated medication list for this problem includes:    Furosemide 40 Mg Tabs (Furosemide) .Marland Kitchen... Take one tablet by mouth once daily    Metoprolol Succinate 50 Mg Xr24h-tab (Metoprolol succinate) .Marland Kitchen... Take one tablet by mouth daily  Problem # 3:  NON-HODGKIN'S LYMPHOMA (ICD-202.80) He has a history of non-Hodgkin's lymphoma by his primary care physician.  Patient Instructions: 1)  Labwork 12/04/09: bmet (427.31) 2)  Your physician recommends that you continue on  your current medications as directed. Please refer to the Current Medication list given to you today. 3)  Your physician wants you to follow-up in: 6 months with Dr. Johney Frame.  You will receive a reminder letter in the mail two months in advance. If you don't receive a letter, please call our office to schedule the follow-up appointment.

## 2010-02-04 NOTE — Medication Information (Signed)
Summary: rov/tm  Anticoagulant Therapy  Managed by: Bethena Midget, RN, BSN Referring MD: Charlies Constable, MD  PCP: Dr Christiana Fuchs MD: Myrtis Ser MD, Tinnie Gens Indication 1: Atrial Fibrillation Lab Used: [facility] [place] Site: [address] INR POC 2.7 INR RANGE 2.0 - 3.0   Dietary changes: no    Health status changes: no    Bleeding/hemorrhagic complications: no    Recent/future hospitalizations: no    Any changes in medication regimen? no    Recent/future dental: no  Any missed doses?: no       Is patient compliant with meds? yes       Allergies: No Known Drug Allergies  Anticoagulation Management History:      The patient is taking warfarin and comes in today for a routine follow up visit.  Positive risk factors for bleeding include an age of 75 years or older.  The bleeding index is 'intermediate risk'.  Positive CHADS2 values include History of CHF, History of HTN, and Age > 75 years old.  His last INR was 1.5.  Anticoagulation responsible provider: Myrtis Ser MD, Tinnie Gens.  INR POC: 2.7.  Cuvette Lot#: [id number].  Exp: 02/2011.    Anticoagulation Management Assessment/Plan:      The patient's current anticoagulation dose is Warfarin sodium 5 mg tabs: Take as directed by coumadin clinic..  The target INR is 2.0-3.0.  The next INR is due 02/18/2010.  Anticoagulation instructions were given to patient/spouse.  Results were reviewed/authorized by Bethena Midget, RN, BSN.  He was notified by Linward Headland, PharmD candidate.         Prior Anticoagulation Instructions: INR 1.8 Today take extra 1/2 pill then change dose to 1 pill everyday except 1.5 pills on Mondays and Fridays. Recheck in 2 weeks.   Current Anticoagulation Instructions: INR 2.7 (goal INR: 2-3)  Take 1 tablet everyday except 1 and 1/2 tablets on Mondays and Fridays.  Recheck in 3 weeks.

## 2010-02-04 NOTE — Letter (Signed)
[facility]   Imported By: Marylou Mccoy 06/10/2009 12:11:21  _____________________________________________________________________  External Attachment:    Type:   Image     Comment:   External Document

## 2010-02-04 NOTE — Medication Information (Signed)
Summary: rov  Anticoagulant Therapy  Managed by: Bethena Midget, RN, BSN Referring MD: Charlies Constable, MD  PCP: Dr Christiana Fuchs MD: Shirlee Latch MD, Dalton Indication 1: Atrial Fibrillation Lab Used: [facility] [place] Site: [address] INR POC 1.7 INR RANGE 2.0 - 3.0   Dietary changes: no    Health status changes: no    Bleeding/hemorrhagic complications: no    Recent/future hospitalizations: no    Any changes in medication regimen? yes       Details: Amoxicillin was taken for 8 days, regimen completed  Recent/future dental: no  Any missed doses?: no       Is patient compliant with meds? yes       Allergies: No Known Drug Allergies  Anticoagulation Management History:      The patient is taking warfarin and comes in today for a routine follow up visit.  Positive risk factors for bleeding include an age of 75 years or older.  The bleeding index is 'intermediate risk'.  Positive CHADS2 values include History of CHF, History of HTN, and Age > 75 years old.  His last INR was 1.5.  Anticoagulation responsible provider: Shirlee Latch MD, [place].  INR POC: 1.7.  Exp: 12/2010.    Anticoagulation Management Assessment/Plan:      The patient's current anticoagulation dose is Warfarin sodium 5 mg tabs: Take as directed by coumadin clinic..  The target INR is 2.0-3.0.  The next INR is due 01/15/2010.  Anticoagulation instructions were given to patient.  Results were reviewed/authorized by Bethena Midget, RN, BSN.  He was notified by Bethena Midget, RN, BSN.         Prior Anticoagulation Instructions: INR 1.5 (goal 2-3)  Take an extra 1/2 tablet today (12/16).  Then take 1 tablet everyday except take 1/2 tablet on Tuesdays.  Return for INR check in 2 weeks on Friday, December 30th at 9:00AM.  Current Anticoagulation Instructions: INR 1.7 Today take extra 1/2 pill then change dose to 1 pill everyday. Recheck in 2 weeks.

## 2010-02-04 NOTE — Letter (Signed)
Summary: Handout Printed  Printed Handout:  - Coumadin Instructions-w/out Meds 

## 2010-02-04 NOTE — Medication Information (Signed)
Summary: rov/ewj  Anticoagulant Therapy  Managed by: Cloyde Reams, RN, BSN Referring MD: Charlies Constable, MD  PCP: Dr Christiana Fuchs MD: Eden Emms MD, Theron Arista Indication 1: Atrial Fibrillation Lab Used: [facility] [place] Site: [address] INR POC 2.2 INR RANGE 2.0 - 3.0   Dietary changes: no    Health status changes: no    Bleeding/hemorrhagic complications: no    Recent/future hospitalizations: no    Any changes in medication regimen? no    Recent/future dental: no  Any missed doses?: no       Is patient compliant with meds? yes      Comments: Possible DCCV pending.    Allergies (verified): No Known Drug Allergies  Anticoagulation Management History:      The patient is taking warfarin and comes in today for a routine follow up visit.  Positive risk factors for bleeding include an age of 75 years or older.  The bleeding index is 'intermediate risk'.  Positive CHADS2 values include History of CHF, History of HTN, and Age > 14 years old.  Anticoagulation responsible provider: Eden Emms MD, Theron Arista.  INR POC: 2.2.  Cuvette Lot#: [id number].  Exp: 05/2010.    Anticoagulation Management Assessment/Plan:      The patient's current anticoagulation dose is Warfarin sodium 5 mg tabs: one tab by mouth once daily as directed.  The target INR is 2.0-3.0.  The next INR is due 03/31/2009.  Anticoagulation instructions were given to patient.  Results were reviewed/authorized by Cloyde Reams, RN, BSN.  He was notified by Cloyde Reams RN.         Prior Anticoagulation Instructions: INR 1.2  Take 1.5 tabs today, then take 1.5 tabs daily except 1 tab each Monday, Wednesday, Friday.  Recheck in 1 week.   Current Anticoagulation Instructions: INR 2.2  Continue on same dosage 1.5 tablets daily except 1 tablet on Mondays, Wednesdays, and Fridays.  Recheck in 1 week.

## 2010-02-04 NOTE — Medication Information (Signed)
Summary: rov/ewj  Anticoagulant Therapy  Managed by: Cloyde Reams, RN, BSN Referring MD: Charlies Constable, MD  PCP: Dr Christiana Fuchs MD: Excell Seltzer MD, Casimiro Needle Indication 1: Atrial Fibrillation Lab Used: [facility] [place] Site: [address] INR POC 2.2 INR RANGE 2.0 - 3.0   Dietary changes: no    Health status changes: no    Bleeding/hemorrhagic complications: no    Recent/future hospitalizations: no    Any changes in medication regimen? yes       Details: Starting Amiodarone today 400mg  x 1 week, then 200mg  daily.    Recent/future dental: no  Any missed doses?: no       Is patient compliant with meds? yes       Allergies: No Known Drug Allergies  Anticoagulation Management History:      The patient is taking warfarin and comes in today for a routine follow up visit.  Positive risk factors for bleeding include an age of 75 years or older.  The bleeding index is 'intermediate risk'.  Positive CHADS2 values include History of CHF, History of HTN, and Age > 46 years old.  Anticoagulation responsible provider: Excell Seltzer MD, Casimiro Needle.  INR POC: 2.2.  Cuvette Lot#: [id number].  Exp: 06/2010.    Anticoagulation Management Assessment/Plan:      The patient's current anticoagulation dose is Warfarin sodium 5 mg tabs: Take as directed by coumadin clinic..  The target INR is 2.0-3.0.  The next INR is due 05/04/2009.  Anticoagulation instructions were given to patient.  Results were reviewed/authorized by Cloyde Reams, RN, BSN.  He was notified by Cloyde Reams RN.         Prior Anticoagulation Instructions: INR 3.2  Take 1/2 tablet tomorrow, then start taking 1 tablet daily except 1.5 tablets on Mondays and Fridays.  Recheck in 1 week.  Pending Cardioversion.  Current Anticoagulation Instructions: INR 2.2  Continue on same dosage.  Recheck in 1 week.

## 2010-02-04 NOTE — Medication Information (Signed)
Summary: rov/cs  Anticoagulant Therapy  Managed by: Cloyde Reams, RN, BSN Referring MD: Charlies Constable, MD  PCP: Dr Christiana Fuchs MD: Juanda Chance MD, Tully Burgo Indication 1: Atrial Fibrillation Lab Used: [facility] [place] Site: [address] INR POC 2.0 INR RANGE 2.0 - 3.0   Dietary changes: no    Health status changes: no    Bleeding/hemorrhagic complications: no    Recent/future hospitalizations: no    Any changes in medication regimen? no    Recent/future dental: no  Any missed doses?: no       Is patient compliant with meds? yes      Comments: Seeing Dr Juanda Chance today  Allergies: No Known Drug Allergies  Anticoagulation Management History:      The patient is taking warfarin and comes in today for a routine follow up visit.  Positive risk factors for bleeding include an age of 13 years or older.  The bleeding index is 'intermediate risk'.  Positive CHADS2 values include History of CHF, History of HTN, and Age > 36 years old.  Anticoagulation responsible provider: Juanda Chance MD, Smitty Cords.  INR POC: 2.0.  Cuvette Lot#: [id number].  Exp: 11/2010.    Anticoagulation Management Assessment/Plan:      The patient's current anticoagulation dose is Warfarin sodium 5 mg tabs: Take as directed by coumadin clinic..  The target INR is 2.0-3.0.  The next INR is due 12/04/2009.  Anticoagulation instructions were given to patient.  Results were reviewed/authorized by Cloyde Reams, RN, BSN.  He was notified by Cloyde Reams RN.         Prior Anticoagulation Instructions: INR 3.5  Skip tomorrow's dose.  Then resume normal Coumadin schedule:  1 tablet daily except 1/2 tablet on Tuesday and Saturday.  Return to clinic in 3 weeks.    Current Anticoagulation Instructions: INR 2.0  Continue on same dosage 1 tablet daily except 1/2 tablet on Tuesdays and Saturdays.  Return in 4 weeks.

## 2010-02-04 NOTE — Medication Information (Signed)
Summary: rov/sp  Anticoagulant Therapy  Managed by: Eda Keys, PharmD Referring MD: Charlies Constable, MD  PCP: Dr Christiana Fuchs MD: Jens Som MD, Arlys John Indication 1: Atrial Fibrillation Lab Used: [facility] [place] Site: [address] INR POC 3.8 INR RANGE 2.0 - 3.0   Dietary changes: no    Health status changes: no    Bleeding/hemorrhagic complications: no    Recent/future hospitalizations: no    Any changes in medication regimen? no    Recent/future dental: no  Any missed doses?: no       Is patient compliant with meds? yes       Allergies: No Known Drug Allergies  Anticoagulation Management History:      The patient is taking warfarin and comes in today for a routine follow up visit.  Positive risk factors for bleeding include an age of 15 years or older.  The bleeding index is 'intermediate risk'.  Positive CHADS2 values include History of CHF, History of HTN, and Age > 7 years old.  Anticoagulation responsible provider: Jens Som MD, Arlys John.  INR POC: 3.8.  Cuvette Lot#: [id number].  Exp: 06/2010.    Anticoagulation Management Assessment/Plan:      The patient's current anticoagulation dose is Warfarin sodium 5 mg tabs: Take as directed by coumadin clinic..  The target INR is 2.0-3.0.  The next INR is due 05/08/2009.  Anticoagulation instructions were given to patient.  Results were reviewed/authorized by Eda Keys, PharmD.  He was notified by [facility].         Prior Anticoagulation Instructions: INR 2.5  Continue saem dose of 1 tablet every day except 1 1/2 tablets on Monday and Friday   Current Anticoagulation Instructions: INR 3.8 Hold coumadin dose tomorrow.Continue normal schedule on Sunday Take 1 tablet daily except take 1.5 on Monday and Friday of each week.Return in  1 week.

## 2010-02-04 NOTE — Medication Information (Signed)
Summary: [facility]  Anticoagulant Therapy  Managed by: Bethena Midget, RN, BSN Referring MD: Charlies Constable, MD  PCP: Dr Christiana Fuchs MD: Shirlee Latch MD, Lonita Debes Indication 1: Atrial Fibrillation Lab Used: [facility] [place] Site: [address] INR POC 4.2 INR RANGE 2.0 - 3.0   Dietary changes: no    Health status changes: no    Bleeding/hemorrhagic complications: no    Recent/future hospitalizations: no    Any changes in medication regimen? no    Recent/future dental: no  Any missed doses?: no       Is patient compliant with meds? yes       Allergies: No Known Drug Allergies  Anticoagulation Management History:      The patient is taking warfarin and comes in today for a routine follow up visit.  Positive risk factors for bleeding include an age of 75 years or older.  The bleeding index is 'intermediate risk'.  Positive CHADS2 values include History of CHF, History of HTN, and Age > 48 years old.  Anticoagulation responsible provider: Shirlee Latch MD, Lee-Anne Flicker.  INR POC: 4.2.  Cuvette Lot#: [id number].  Exp: 10/2010.    Anticoagulation Management Assessment/Plan:      The patient's current anticoagulation dose is Warfarin sodium 5 mg tabs: Take as directed by coumadin clinic..  The target INR is 2.0-3.0.  The next INR is due 08/06/2009.  Anticoagulation instructions were given to patient.  Results were reviewed/authorized by Bethena Midget, RN, BSN.  He was notified by Bethena Midget, RN, BSN.         Prior Anticoagulation Instructions: INR 4.1  Skip tomorrow's dosage of coumadin, then start taking 1 tablet daily except 1/2 tablet on Mondays.  Recheck in 2 weeks.    Current Anticoagulation Instructions: INR 4.2 Skip Fridays dose then change dose to 1 pill everyday except 1/2 pill on Tuesdays and Saturdays. Recheck in 2 weeks.

## 2010-02-04 NOTE — Medication Information (Signed)
Summary: coumadin/mt  Anticoagulant Therapy  Managed by: Bethena Midget, RN, BSN Referring MD: Charlies Constable, MD  PCP: Dr Christiana Fuchs MD: Jens Som MD, Arlys John Indication 1: Atrial Fibrillation Lab Used: [facility] [place] Site: [address] INR POC 2.2 INR RANGE 2.0 - 3.0   Dietary changes: no    Health status changes: no    Bleeding/hemorrhagic complications: no    Recent/future hospitalizations: no    Any changes in medication regimen? no    Recent/future dental: no  Any missed doses?: no       Is patient compliant with meds? yes       Allergies: No Known Drug Allergies  Anticoagulation Management History:      The patient is taking warfarin and comes in today for a routine follow up visit.  Positive risk factors for bleeding include an age of 75 years or older.  The bleeding index is 'intermediate risk'.  Positive CHADS2 values include History of CHF, History of HTN, and Age > 75 years old.  Anticoagulation responsible provider: Jens Som MD, Arlys John.  INR POC: 2.2.  Cuvette Lot#: [id number].  Exp: 10/2010.    Anticoagulation Management Assessment/Plan:      The patient's current anticoagulation dose is Warfarin sodium 5 mg tabs: Take as directed by coumadin clinic..  The target INR is 2.0-3.0.  The next INR is due 09/04/2009.  Anticoagulation instructions were given to patient.  Results were reviewed/authorized by Bethena Midget, RN, BSN.  He was notified by Liana Gerold, PharmD Candidate.         Prior Anticoagulation Instructions: INR 4.2 Skip Fridays dose then change dose to 1 pill everyday except 1/2 pill on Tuesdays and Saturdays. Recheck in 2 weeks.   Current Anticoagulation Instructions: INR 2.2  Continue with 1 tablet daily except 1/2 tablet on Tue and Sat.  Return to clinic in 3 weeks.

## 2010-02-04 NOTE — Assessment & Plan Note (Signed)
Summary: f1w   Visit Type:  Follow-up- 1 week Referring Provider:  dr Yong Channel Primary Provider:  Dr Yong Channel  CC:  SOB this weekend- worse with activity/ up and down steps. He denies dizziness and lightheadedness. He also denies chest pain. He has had increased urine output this weekend. He denies lower extremity swelling.Marland Kitchen  History of Present Illness: The patient is 77 years old and return for management of atrial fibrillation and congestive heart failure. I saw him in consultation last week with a two-month history of shortness of breath and new onset atrial fibrillation and signs of congestive heart failure. We started him on Lasix Coreg and Lotensin. He is somewhat improved and lost about 3 pounds on our scale and feels that his shortness of breath with exertion is better but certainly not resolve. He's had no chest pain.  We did an echocardiogram yesterday which showed good LV function with an ejection fraction of 60%. He had a mean aortic valve gradient of 15 with mild aortic stenosis.  His other problems include hypertension hyperlipidemia and diabetes. He also has a non-Hodgkin's lymphoma. His last chemotherapy was in November of 2010.  Current Medications (verified): 1)  Metformin Hcl 500 Mg Tabs (Metformin Hcl) .... Two Times A Day 2)  Pravastatin Sodium 80 Mg Tabs (Pravastatin Sodium) .... Take One Tablet By Mouth Daily At Bedtime 3)  Ferrous Sulfate 325 (65 Fe) Mg  Tabs (Ferrous Sulfate) .... Once Daily 4)  Aspirin 81 Mg Tbec (Aspirin) .... Take One Tablet By Mouth Daily 5)  Furosemide 40 Mg Tabs (Furosemide) .... Take One Tablet By Mouth Daily and As Directed 6)  Carvedilol 12.5 Mg Tabs (Carvedilol) .... Take One Tablet By Mouth Twice A Day 7)  Lotensin 10 Mg Tabs (Benazepril Hcl) .... Take 1 Tablet By Mouth Once A Day  Allergies (verified): No Known Drug Allergies  Past History:  Past Medical History: Reviewed history from 03/06/2009 and no changes required. Diabetic    non hodgkin's lymphoma ATRIAL FIBRILLATION  HYPERTENSION, UNSPECIFIED    Review of Systems       ROS is negative except as outlined in HPI.   Vital Signs:  Patient profile:   77 year old male Height:      69 inches Weight:      217.75 pounds Pulse rate:   72 / minute Pulse rhythm:   irregular BP sitting:   138 / 70  (left arm) Cuff size:   large  Physical Exam  Additional Exam:  Gen. Well-nourished, in no distress   Neck: JVP 1 cm above the clavicle, thyroid not enlarged, no carotid bruits Lungs: No tachypnea, bilateral basilar rales, rhonchi or wheezes Cardiovascular: Rhythm irregular, PMI not displaced,  heart sounds  normal, grade 2/6 systolic ejection murmur, no peripheral edema, pulses normal in all 4 extremities. Abdomen: BS normal, abdomen soft and non-tender without masses or organomegaly, no hepatosplenomegaly. MS: No deformities, no cyanosis or clubbing   Neuro:  No focal sns   Skin:  no lesions    Impression & Recommendations:  Problem # 1:  COMBINED HEART FAILURE, ACUTE (ICD-428.41) He has diastolic heart failure. His ejection fraction by echocardiography with 60%. His heart failure is better from the standpoint of his symptoms are a little better and he lost about 3 pounds. He still has evidence of some volume overload on exam with JVD and pulmonary rales. We will increase his Lasix from 40 once a day to 40 twice a day and will increase his Lotensin  from 10 once a day to 10 twice a day. We will get a BMP and BNP today. Will get a repeat BMP next week.  The following medications were removed from the medication list:    Aspirin Ec 325 Mg Tbec (Aspirin) .Marland Kitchen... Take one tablet by mouth daily His updated medication list for this problem includes:    Aspirin 81 Mg Tbec (Aspirin) .Marland Kitchen... Take one tablet by mouth daily    Furosemide 40 Mg Tabs (Furosemide) .Marland Kitchen... Take one tablet by mouth two times a day    Carvedilol 12.5 Mg Tabs (Carvedilol) .Marland Kitchen... Take one tablet by  mouth twice a day    Lotensin 10 Mg Tabs (Benazepril hcl) .Marland Kitchen... Take 1 tablet by mouth two times a day    Warfarin Sodium 5 Mg Tabs (Warfarin sodium) ..... One tab by mouth once daily as directed  Problem # 2:  ATRIAL FIBRILLATION (ICD-427.31)  His rate appears fairly well controlled on his current medications. This is probably contributing to his congestive heart failure so I think we should try and restore sinus rhythm. We will start him on Coumadin but he and his wife had a death in the family want to go to [place] this weekend. We'll hold off and start the Coumadin on Sunday and have him seen in the Coumadin clinic on Wednesday. We'll plan 3-4 weeks of anticoagulant therapy and then consider cardioversion. I may start him on amiodarone prior to cardioversion. The following medications were removed from the medication list:    Aspirin Ec 325 Mg Tbec (Aspirin) .Marland Kitchen... Take one tablet by mouth daily His updated medication list for this problem includes:    Aspirin 81 Mg Tbec (Aspirin) .Marland Kitchen... Take one tablet by mouth daily    Carvedilol 12.5 Mg Tabs (Carvedilol) .Marland Kitchen... Take one tablet by mouth twice a day    Warfarin Sodium 5 Mg Tabs (Warfarin sodium) ..... One tab by mouth once daily as directed  The following medications were removed from the medication list:    Aspirin Ec 325 Mg Tbec (Aspirin) .Marland Kitchen... Take one tablet by mouth daily His updated medication list for this problem includes:    Aspirin 81 Mg Tbec (Aspirin) .Marland Kitchen... Take one tablet by mouth daily    Carvedilol 12.5 Mg Tabs (Carvedilol) .Marland Kitchen... Take one tablet by mouth twice a day  Orders: TLB-BMP (Basic Metabolic Panel-BMET) (80048-METABOL) TLB-BNP (B-Natriuretic Peptide) (83880-BNPR) [facility] Referral (Coumadin clinic)  Problem # 3:  HYPERTENSION, UNSPECIFIED (ICD-401.9)  Blood pressure slightly elevated today. We'll increase the Lotensin both for blood pressure and congestive heart failure. The following medications  were removed from the medication list:    Aspirin Ec 325 Mg Tbec (Aspirin) .Marland Kitchen... Take one tablet by mouth daily His updated medication list for this problem includes:    Aspirin 81 Mg Tbec (Aspirin) .Marland Kitchen... Take one tablet by mouth daily    Furosemide 40 Mg Tabs (Furosemide) .Marland Kitchen... Take one tablet by mouth two times a day    Carvedilol 12.5 Mg Tabs (Carvedilol) .Marland Kitchen... Take one tablet by mouth twice a day    Lotensin 10 Mg Tabs (Benazepril hcl) .Marland Kitchen... Take 1 tablet by mouth two times a day  Orders: TLB-BMP (Basic Metabolic Panel-BMET) (80048-METABOL) TLB-BNP (B-Natriuretic Peptide) (83880-BNPR) [facility] Referral (Coumadin clinic)  Problem # 4:  NON-HODGKIN'S LYMPHOMA (ICD-202.80) Hhas non-Hodgkin's lymphoma. I don't have his latest CBC and will try to get that. This may be contributing to his congestive heart failure.  Patient Instructions: 1)  Your physician recommends that you schedule a follow-up appointment in: 2 weeks 2)  Your physician recommends that you have lab work today: bmet/bnp(427.31) 3)  Your physician has recommended you make the following change in your medication: 1) Increase lasix to 40mg  two times a day, 2) Increase benazepril to 10mg  two times a day, 3) Start Coumadin 5mg  once daily - on sunday 03/15/09. 4)  You will need to see the coumadin clinic on Wed. 03/18/09. 5)  Watch the salt in your diet. Prescriptions: WARFARIN SODIUM 5 MG TABS (WARFARIN SODIUM) one tab by mouth once daily as directed  #30 x 3   Entered by:   Heather McGhee, RN, BSN   Authorized by:    Rogers , MD, FACC   Signed by:   Heather McGhee, RN, BSN on 03/10/2009   Method used:   Electronically to        [facility] [address] [id number] (retail)       [address]       [place], [place]  [address]       Ph: [phone]       Fax: [phone]   RxID:   [id number]

## 2010-02-04 NOTE — Letter (Signed)
[facility]   Imported By: Marylou Mccoy 06/10/2009 12:10:28  _____________________________________________________________________  External Attachment:    Type:   Image     Comment:   External Document

## 2010-02-04 NOTE — Assessment & Plan Note (Signed)
Summary: PER CHECK OUT   Visit Type:  Follow-up Referring Provider:  dr Yong Channel Primary Provider:  Dr Yong Channel  CC:  The pt experienced episodes of dizziness on monday and tuesday due to a SBP < 100 mmHg. He has held his coreg x 3 days due to his low bp. He denies CP and edema. He has occasional SOB.  Marland Kitchen  History of Present Illness: The patient is 77 years old and return for management of CHF and diastolic heart failure. His wife was with him today.  When I saw him last time we started him on amiodarone with plans to proceed with DC cardioversion if he did not convert. He is been feeling fairly well. He's had no chest pain or shortness of breath. He does say he has had some dizziness and his blood pressures have been low at home. According to his wife he has stopped the Lotensin. We had already cut back on the dose of carvedilol we started the amiodarone.  We have evaluated him with an echocardiogram which showed ejection fraction of 60% and a mean aortic gradient of 15 mm.  His other problems include hypertension, diabetes, and non-Hodgkin's lymphoma.  Current Medications (verified): 1)  Ferrous Sulfate 325 (65 Fe) Mg  Tabs (Ferrous Sulfate) .... Once Daily 2)  Furosemide 40 Mg Tabs (Furosemide) .... Take One Tablet By Mouth Two Times A Day 3)  Carvedilol 12.5 Mg Tabs (Carvedilol) .... Take One-Half Tablet Two Times A Day Starting On 04/27/09 4)  Lotensin 10 Mg Tabs (Benazepril Hcl) .... Take 1 Tablet By Mouth Two Times A Day 5)  Warfarin Sodium 5 Mg Tabs (Warfarin Sodium) .... Take As Directed By Coumadin Clinic. 6)  Amiodarone Hcl 400 Mg Tabs (Amiodarone Hcl) .... Take One Tablet By Mouth Daily  Allergies (verified): No Known Drug Allergies  Past History:  Past Medical History: Reviewed history from 03/06/2009 and no changes required. Diabetic  non hodgkin's lymphoma ATRIAL FIBRILLATION  HYPERTENSION, UNSPECIFIED    Review of Systems       ROS is negative except as outlined  in HPI.   Vital Signs:  Patient profile:   77 year old male Height:      69 inches Weight:      209 pounds Pulse rate:   71 / minute BP sitting:   90 / 62  (left arm) Cuff size:   large  Vitals Entered By: Sherri Rad, RN, BSN (May 15, 2009 11:10 AM)  Physical Exam  Additional Exam:  Gen. Well-nourished, in no distress   Neck: No JVD, thyroid not enlarged, no carotid bruits Lungs: No tachypnea, clear without rales, rhonchi or wheezes Cardiovascular: Rhythm irregular, PMI not displaced,  heart sounds  normal, no murmurs or gallops, no peripheral edema, pulses normal in all 4 extremities. Abdomen: BS normal, abdomen soft and non-tender without masses or organomegaly, no hepatosplenomegaly. MS: No deformities, no cyanosis or clubbing   Neuro:  No focal sns   Skin:  no lesions    Impression & Recommendations:  Problem # 1:  ATRIAL FIBRILLATION (ICD-427.31)  His atrial fibrillation has been persistent despite initiation of amiodarone therapy. He is INRs have been therapeutic over the past month. We will arrange for him to come in next week for DC cardioversion.  The following medications were removed from the medication list:    Carvedilol 12.5 Mg Tabs (Carvedilol) .Marland Kitchen... Take one-half tablet two times a day starting on 04/27/09 His updated medication list for this problem includes:  Warfarin Sodium 5 Mg Tabs (Warfarin sodium) .Marland Kitchen... Take as directed by coumadin clinic.    Amiodarone Hcl 400 Mg Tabs (Amiodarone hcl) .Marland Kitchen... Take one tablet by mouth daily  Orders: EKG w/ Interpretation (93000) Cardioversion (Cardioversion)  Problem # 2:  DIASTOLIC HEART FAILURE, CHRONIC (ICD-428.32) He appears euvolemic today. He is on Lasix b.i.d. We will get a BMP today. The following medications were removed from the medication list:    Carvedilol 12.5 Mg Tabs (Carvedilol) .Marland Kitchen... Take one-half tablet two times a day starting on 04/27/09    Lotensin 10 Mg Tabs (Benazepril hcl) .Marland Kitchen... Take 1  tablet by mouth two times a day His updated medication list for this problem includes:    Furosemide 40 Mg Tabs (Furosemide) .Marland Kitchen... Take one tablet by mouth two times a day    Warfarin Sodium 5 Mg Tabs (Warfarin sodium) .Marland Kitchen... Take as directed by coumadin clinic.    Amiodarone Hcl 400 Mg Tabs (Amiodarone hcl) .Marland Kitchen... Take one tablet by mouth daily  Problem # 3:  HYPERTENSION, UNSPECIFIED (ICD-401.9) He has been on antihypertensives but his blood pressure now is low. His wife said he already stopped the Lotensin. He is on just a low dose of carvedilol we will stop that as well. He is still getting a beta blocker effect from the amiodarone. The following medications were removed from the medication list:    Carvedilol 12.5 Mg Tabs (Carvedilol) .Marland Kitchen... Take one-half tablet two times a day starting on 04/27/09    Lotensin 10 Mg Tabs (Benazepril hcl) .Marland Kitchen... Take 1 tablet by mouth two times a day His updated medication list for this problem includes:    Furosemide 40 Mg Tabs (Furosemide) .Marland Kitchen... Take one tablet by mouth two times a day  Patient Instructions: 1)  Your physician recommends that you schedule a follow-up appointment in: 2-4 weeks. 2)  Your physician has recommended you make the following change in your medication: 1) STOP Coreg, 2) STOP benazepril. 3)  Your physician has recommended that you have a cardioversion (DCCV).  Electrical cardioversion uses a jolt of electricity to your heart either through paddles or wired patches attached to your chest. This is a controlled, usually prescheduled, procedure. Defibrillation is done under light anesthesia in the hospital, and you usually go home the day of the procedure. This is done to get your heart back into a normal rhythm. You are not awake for the procedure. Please see the instruction sheet given to you today.

## 2010-02-04 NOTE — Medication Information (Signed)
Summary: new coumadin eval  Anticoagulant Therapy  Managed by: Shelby Dubin, PharmD, BCPS, CPP Referring MD: Charlies Constable, MD  PCP: Dr Yong Channel Supervising MD: Johney Frame MD, Fayrene Fearing Indication 1: Atrial Fibrillation Lab Used: [facility] [place] Site: [address] INR POC 1.2 INR RANGE 2.0 - 3.0   Dietary changes: no    Health status changes: no    Bleeding/hemorrhagic complications: no    Recent/future hospitalizations: no    Any changes in medication regimen? no    Recent/future dental: no  Any missed doses?: no       Is patient compliant with meds? yes      Comments: New pt education completed. Started 5 mg daily on Sunday 3/13  Allergies (verified): No Known Drug Allergies  Anticoagulation Management History:      The patient is taking warfarin and comes in today for a routine follow up visit.  Positive risk factors for bleeding include an age of 3 years or older.  The bleeding index is 'intermediate risk'.  Positive CHADS2 values include History of CHF, History of HTN, and Age > 68 years old.  Anticoagulation responsible provider: Lilliam Chamblee MD, Fayrene Fearing.  INR POC: 1.2.  Cuvette Lot#: [id number].  Exp: 05/2010.    Anticoagulation Management Assessment/Plan:      The patient's current anticoagulation dose is Warfarin sodium 5 mg tabs: one tab by mouth once daily as directed.  The next INR is due 03/24/2009.  Anticoagulation instructions were given to patient.  Results were reviewed/authorized by Shelby Dubin, PharmD, BCPS, CPP.  He was notified by Shelby Dubin PharmD, BCPS, CPP.         Current Anticoagulation Instructions: INR 1.2  Take 1.5 tabs today, then take 1.5 tabs daily except 1 tab each Monday, Wednesday, Friday.  Recheck in 1 week.

## 2010-02-04 NOTE — Assessment & Plan Note (Signed)
Summary: f6w   Visit Type:  Follow-up Referring Provider:  dr Yong Channel Primary Provider:  Dr Yong Channel  CC:  no complaints.  History of Present Illness: The patient is 77 years old and return for management of atrial fibrillation and diastolic CHF. He developed paroxysmal atrial fibrillation and we treated him with amiodarone followed by DC cardioversion. Unfortunately he reverted to atrial fibrillation on his last visit. We stop the amiodarone and are managing his atrial fibrillation with rate control with digoxin and Toprol and Coumadin.  He has done well since his last visit. He says he feels stronger. He's had no symptoms of palpitations and no fluid accumulation.   Current Medications (verified): 1)  Ferrous Sulfate 325 (65 Fe) Mg  Tabs (Ferrous Sulfate) .... Out 2)  Furosemide 40 Mg Tabs (Furosemide) .... Take One Tablet By Mouth Two Times A Day 3)  Warfarin Sodium 5 Mg Tabs (Warfarin Sodium) .... Take As Directed By Coumadin Clinic. 4)  Digoxin 0.125 Mg Tabs (Digoxin) .... Take One Tab By Mouth Once Daily 5)  Metoprolol Succinate 50 Mg Xr24h-Tab (Metoprolol Succinate) .... Take One Tablet By Mouth Daily  Allergies (verified): No Known Drug Allergies  Past History:  Past Medical History: Reviewed history from 03/06/2009 and no changes required. Diabetic  non hodgkin's lymphoma ATRIAL FIBRILLATION  HYPERTENSION, UNSPECIFIED    Review of Systems       ROS is negative except as outlined in HPI.   Vital Signs:  Patient profile:   77 year old male Height:      69 inches Weight:      213 pounds BMI:     31.57 Pulse rate:   79 / minute Pulse rhythm:   irregular Resp:     18 per minute BP sitting:   116 / 68  (right arm)  Vitals Entered By: Marrion Coy, CNA (July 13, 2009 9:54 AM)  Physical Exam  Additional Exam:  Gen. Well-nourished, in no distress   Neck: No JVD, thyroid not enlarged, no carotid bruits Lungs: No tachypnea, clear without rales, rhonchi or  wheezes Cardiovascular: Rhythm irregular, PMI not displaced,  heart sounds  normal, grade 2/6 systolic ejection murmur at the left sternal H., no peripheral edema, pulses normal in all 4 extremities. Abdomen: BS normal, abdomen soft and non-tender without masses or organomegaly, no hepatosplenomegaly. MS: No deformities, no cyanosis or clubbing   Neuro:  No focal sns   Skin:  no lesions    Impression & Recommendations:  Problem # 1:  ATRIAL FIBRILLATION (ICD-427.31) He is now in chronic or permanent atrial fibrillation. His rate is well controlled he is on Coumadin therapy. We talked about the possible use of Pradaxa but he does not have any co-pays for his Coumadin because of insurance coverage. We will continue Coumadin for now.  His updated medication list for this problem includes:    Warfarin Sodium 5 Mg Tabs (Warfarin sodium) .Marland Kitchen... Take as directed by coumadin clinic.    Digoxin 0.125 Mg Tabs (Digoxin) .Marland Kitchen... Take one tab by mouth once daily    Metoprolol Succinate 50 Mg Xr24h-tab (Metoprolol succinate) .Marland Kitchen... Take one tablet by mouth daily  Problem # 2:  DIASTOLIC HEART FAILURE, CHRONIC (ICD-428.32) He appears euvolemic today and well compensated. We will continue current therapy. His updated medication list for this problem includes:    Furosemide 40 Mg Tabs (Furosemide) .Marland Kitchen... Take one tablet by mouth two times a day    Warfarin Sodium 5 Mg Tabs (Warfarin sodium) .Marland Kitchen... Take  as directed by coumadin clinic.    Digoxin 0.125 Mg Tabs (Digoxin) .Marland Kitchen... Take one tab by mouth once daily    Metoprolol Succinate 50 Mg Xr24h-tab (Metoprolol succinate) .Marland Kitchen... Take one tablet by mouth daily  Problem # 3:  HYPERTENSION, UNSPECIFIED (ICD-401.9) This is well-controlled on current medications. His updated medication list for this problem includes:    Furosemide 40 Mg Tabs (Furosemide) .Marland Kitchen... Take one tablet by mouth two times a day    Metoprolol Succinate 50 Mg Xr24h-tab (Metoprolol succinate) .Marland Kitchen...  Take one tablet by mouth daily  Patient Instructions: 1)  Your physician recommends that you schedule a follow-up appointment in: 4 months with Dr. Juanda Chance 2)  Your physician recommends that you continue on your current medications as directed. Please refer to the Current Medication list given to you today.

## 2010-02-04 NOTE — Assessment & Plan Note (Signed)
Summary: 2wk f/u    Visit Type:  Follow-up Referring Provider:  dr Yong Channel Primary Provider:  Dr Yong Channel  CC:  no complaints.  History of Present Illness: The patient is 77 years old and returns for management of atrial fib and diastolic CHF. He has recent onset atrial fibrillation and we have been planning rhythm control. He was started on amiodarone and Coumadin and he had a DC cardioversion about 2 weeks ago on 5 /17/11.  unfortunately has reverted back to atrial fibrillation on his cardiogram today. He could not tell when he went back into the fibrillation rhythm. He says been feeling fairly well.  He said no chest pain shortness breath or palpitations.  He does have a history of diastolic CHF but has no symptoms of shortness of breath or edema recently.  Current Medications (verified): 1)  Ferrous Sulfate 325 (65 Fe) Mg  Tabs (Ferrous Sulfate) .... Once Daily 2)  Furosemide 40 Mg Tabs (Furosemide) .... Take One Tablet By Mouth Two Times A Day 3)  Warfarin Sodium 5 Mg Tabs (Warfarin Sodium) .... Take As Directed By Coumadin Clinic. 4)  Amiodarone Hcl 200 Mg Tabs (Amiodarone Hcl) .... Take One Tablet By Mouth Daily  Allergies (verified): No Known Drug Allergies  Past History:  Past Medical History: Reviewed history from 03/06/2009 and no changes required. Diabetic  non hodgkin's lymphoma ATRIAL FIBRILLATION  HYPERTENSION, UNSPECIFIED    Review of Systems       ROS is negative except as outlined in HPI.   Vital Signs:  Patient profile:   77 year old male Height:      69 inches Weight:      211 pounds BMI:     31.27 Pulse rate:   94 / minute BP sitting:   119 / 68  (left arm) Cuff size:   large  Vitals Entered By: Burnett Kanaris, CNA (May 28, 2009 11:05 AM)  Physical Exam  Additional Exam:  Gen. Well-nourished, in no distress   Neck: No JVD, thyroid not enlarged, no carotid bruits Lungs: No tachypnea, clear without rales, rhonchi or wheezes Cardiovascular:  Rhythm irregular, PMI not displaced,  heart sounds  normal, no murmurs or gallops, no peripheral edema, pulses normal in all 4 extremities. Abdomen: BS normal, abdomen soft and non-tender without masses or organomegaly, no hepatosplenomegaly. MS: No deformities, no cyanosis or clubbing   Neuro:  No focal sns   Skin:  no lesions    Impression & Recommendations:  Problem # 1:  ATRIAL FIBRILLATION (ICD-427.31) He recently underwent DC cardioversion. Unfortunately he has reverted to atrial fibrillation. We will stop his amiodarone. We will have to be careful with rate control medication because his blood pressure is relatively low. His creatinine is 1.3. We will stop his amiodarone today. We'll start Lanoxin 0.125 mg daily and one week from now we'll start Toprol-XL 50 mg daily. Calcium back in 6 weeks.  He is Chand score 3 with pH, hypertension, and diabetes. He should be on long-term Coumadin. We did discuss contacts. The following medications were removed from the medication list:    Amiodarone Hcl 200 Mg Tabs (Amiodarone hcl) .Marland Kitchen... Take one tablet by mouth daily His updated medication list for this problem includes:    Warfarin Sodium 5 Mg Tabs (Warfarin sodium) .Marland Kitchen... Take as directed by coumadin clinic.    Digoxin 0.125 Mg Tabs (Digoxin) .Marland Kitchen... Take one tab by mouth once daily    Metoprolol Succinate 50 Mg Xr24h-tab (Metoprolol succinate) .Marland Kitchen... Take one tablet  by mouth daily  Problem # 2:  DIASTOLIC HEART FAILURE, CHRONIC (ICD-428.32) He appears euvolemic today. We will continue current therapy. The following medications were removed from the medication list:    Amiodarone Hcl 200 Mg Tabs (Amiodarone hcl) .Marland Kitchen... Take one tablet by mouth daily His updated medication list for this problem includes:    Furosemide 40 Mg Tabs (Furosemide) .Marland Kitchen... Take one tablet by mouth two times a day    Warfarin Sodium 5 Mg Tabs (Warfarin sodium) .Marland Kitchen... Take as directed by coumadin clinic.    Digoxin 0.125 Mg  Tabs (Digoxin) .Marland Kitchen... Take one tab by mouth once daily    Metoprolol Succinate 50 Mg Xr24h-tab (Metoprolol succinate) .Marland Kitchen... Take one tablet by mouth daily  Problem # 3:  HYPERTENSION, UNSPECIFIED (ICD-401.9) this is well controlled on current medications. His updated medication list for this problem includes:    Furosemide 40 Mg Tabs (Furosemide) .Marland Kitchen... Take one tablet by mouth two times a day    Metoprolol Succinate 50 Mg Xr24h-tab (Metoprolol succinate) .Marland Kitchen... Take one tablet by mouth daily  His updated medication list for this problem includes:    Furosemide 40 Mg Tabs (Furosemide) .Marland Kitchen... Take one tablet by mouth two times a day  Patient Instructions: 1)  Your physician recommends that you schedule a follow-up appointment in: 6 weeks. 2)  Your physician has recommended you make the following change in your medication: 1) STOP amiodarone, 2) Start lanoxin 0.125mg  once daily- for heart rate control, 3) Start toprol (metoprolol) 50mg  once daily- start this medication in 1 week.  3)  We may look at putting you on Pradaxa 150mg  two times a day in place of coumadin. You may visit pradaxa.com for more information on this drug. Prescriptions: METOPROLOL SUCCINATE 50 MG XR24H-TAB (METOPROLOL SUCCINATE) Take one tablet by mouth daily  #30 x 11   Entered by:   Sherri Rad, RN, BSN   Authorized by:   Lenoria Farrier, MD, [facility]   Signed by:   Sherri Rad, RN, BSN on 05/28/2009   Method used:   Electronically to        [id number]* (retail)       [address] [place], [place]  [address]       Ph: [phone]       Fax: [phone]   RxID:   [id number] DIGOXIN 0.125 MG TABS (DIGOXIN) take one tab by mouth once daily  #30 x 11   Entered by:   Sherri Rad, RN, BSN   Authorized by:   Lenoria Farrier, MD, [facility]   Signed by:   Sherri Rad, RN, BSN on 05/28/2009   Method used:   Electronically to        [facility] #[id number]* (retail)       [address] [place], [place]  [address]       Ph: [phone]       Fax: [phone]   RxID:   [id number]

## 2010-02-04 NOTE — Assessment & Plan Note (Signed)
Summary: f68m   Visit Type:  Follow-up Referring Provider:  dr Yong Channel Primary Provider:  Dr Yong Channel  CC:  no energy.  History of Present Illness: Patient is 77 years old and returns for management of atrial fibrillation and diastolic CHF. I saw him recently with new onset atrial fibrillation and diastolic heart failure. We evaluated with an echocardiogram which showed ejection fraction of 60%. He also has mild aortic stenosis with a mean aortic gradient of 15 mm.  We had started him on Coumadin ersion if  to initiate amiodarone and DC cardioversion if necessary after he had 4 weeks of therapeutic INRs. He has been therapeutic over the last 4 weeks and his INR today was 3.2.  He says he's feeling well with no palpitations or shortness of breath but he does have fatigue and decreased exercise tolerance.  His other problems include hypertension and a non-Hodgkin's lymphoma.  June when he was in the hospital for his lymphot June when he was in the hospital for his lymphoma. He had paroxysmal atrial fibrillation at that time which converted with one dose of flecainide. His wife said he was briefly on amiodarone but I don't see that in the record.  Current Medications (verified): 1)  Ferrous Sulfate 325 (65 Fe) Mg  Tabs (Ferrous Sulfate) .... Once Daily 2)  Furosemide 40 Mg Tabs (Furosemide) .... Take One Tablet By Mouth Two Times A Day 3)  Carvedilol 12.5 Mg Tabs (Carvedilol) .... Take One Tablet By Mouth Twice A Day 4)  Lotensin 10 Mg Tabs (Benazepril Hcl) .... Take 1 Tablet By Mouth Two Times A Day 5)  Warfarin Sodium 5 Mg Tabs (Warfarin Sodium) .... Take As Directed By Coumadin Clinic.  Allergies (verified): No Known Drug Allergies  Past History:  Past Medical History: Reviewed history from 03/06/2009 and no changes required. Diabetic  non hodgkin's lymphoma ATRIAL FIBRILLATION  HYPERTENSION, UNSPECIFIED    Review of Systems       ROS is negative except as outlined in HPI.     Vital Signs:  Patient profile:   77 year old male Height:      69 inches Weight:      215 pounds BMI:     31.86 Pulse rate:   98 / minute BP sitting:   95 / 63  (left arm) Cuff size:   large  Vitals Entered By: Burnett Kanaris, CNA (April 20, 2009 10:00 AM)  Physical Exam  Additional Exam:  Gen. Well-nourished, in no distress   Neck: No JVD, thyroid not enlarged, no carotid bruits Lungs: No tachypnea, clear without rales, rhonchi or wheezes Cardiovascular: Rhythm regular, PMI not displaced,  heart sounds  normal, no murmurs or gallops, no peripheral edema, pulses normal in all 4 extremities. Abdomen: BS normal, abdomen soft and non-tender without masses or organomegaly, no hepatosplenomegaly. MS: No deformities, no cyanosis or clubbing   Neuro:  No focal sns   Skin:  no lesions    Impression & Recommendations:  Problem # 1:  ATRIAL FIBRILLATION (ICD-427.31) He has recent onset atrial fibrillation with symptoms of decreased exercise tolerance. He also has had recent diastolic CHF. He has now had 4 weeks of therapeutic Coumadin and we will start amiodarone 400 mg b.i.d. for a week and then 400 mg daily. I'll see him back in 2-3 weeks with plans for DC cardioversion if he remains in atrial fibrillation.  We will need to modify his Coumadin dosage with the initiation of amiodarone. We will talk to the Coumadin  clinic regarding this.   His updated medication list for this problem includes:    Carvedilol 12.5 Mg Tabs (Carvedilol) .Marland Kitchen... Take one tablet by mouth twice a day    Warfarin Sodium 5 Mg Tabs (Warfarin sodium) .Marland Kitchen... Take as directed by coumadin clinic.  Problem # 2:  HYPERTENSION, UNSPECIFIED (ICD-401.9) This is well controlled on current medications. His updated medication list for this problem includes:    Furosemide 40 Mg Tabs (Furosemide) .Marland Kitchen... Take one tablet by mouth two times a day    Carvedilol 12.5 Mg Tabs (Carvedilol) .Marland Kitchen... Take one-half tablet two times a day  starting on 04/27/09    Lotensin 10 Mg Tabs (Benazepril hcl) .Marland Kitchen... Take 1 tablet by mouth two times a day  His updated medication list for this problem includes:    Furosemide 40 Mg Tabs (Furosemide) .Marland Kitchen... Take one tablet by mouth two times a day    Carvedilol 12.5 Mg Tabs (Carvedilol) .Marland Kitchen... Take one tablet by mouth twice a day    Lotensin 10 Mg Tabs (Benazepril hcl) .Marland Kitchen... Take 1 tablet by mouth two times a day  Problem # 3:  DIASTOLIC HEART FAILURE, CHRONIC (ICD-428.32)  He appears euvolemic today. His ejection fraction is 60%. We will continue current therapy. His updated medication list for this problem includes:    Furosemide 40 Mg Tabs (Furosemide) .Marland Kitchen... Take one tablet by mouth two times a day    Carvedilol 12.5 Mg Tabs (Carvedilol) .Marland Kitchen... Take one-half tablet two times a day starting on 04/27/09    Lotensin 10 Mg Tabs (Benazepril hcl) .Marland Kitchen... Take 1 tablet by mouth two times a day    Warfarin Sodium 5 Mg Tabs (Warfarin sodium) .Marland Kitchen... Take as directed by coumadin clinic.    Amiodarone Hcl 400 Mg Tabs (Amiodarone hcl) .Marland Kitchen... Take one tablet by mouth twice a dayfor 1 week, then take one tablet by mouth daily  His updated medication list for this problem includes:    Furosemide 40 Mg Tabs (Furosemide) .Marland Kitchen... Take one tablet by mouth two times a day    Carvedilol 12.5 Mg Tabs (Carvedilol) .Marland Kitchen... Take one tablet by mouth twice a day    Lotensin 10 Mg Tabs (Benazepril hcl) .Marland Kitchen... Take 1 tablet by mouth two times a day    Warfarin Sodium 5 Mg Tabs (Warfarin sodium) .Marland Kitchen... Take as directed by coumadin clinic.    Amiodarone Hcl 400 Mg Tabs (Amiodarone hcl) .Marland Kitchen... Take one tablet by mouth twice a dayfor 1 week, then take one tablet by mouth daily  Patient Instructions: 1)  Your physician recommends that you schedule a follow-up appointment in: 2-3 weeks. 2)  Your physician has recommended you make the following change in your medication: 1) START amiodarone 400mg  two times a day x 1 week then decrease  amiodarone to 400mg  by mouth once daily. 2) Decrease carvediolol to 12.5mg  one-half tablet two times a day on 04/27/09. Prescriptions: AMIODARONE HCL 400 MG TABS (AMIODARONE HCL) Take one tablet by mouth twice a dayfor 1 week, then take one tablet by mouth daily  #32 x 6   Entered by:   Sherri Rad, RN, BSN   Authorized by:   Lenoria Farrier, MD, [facility]   Signed by:   Sherri Rad, RN, BSN on 04/20/2009   Method used:   Electronically to        [id number]* (retail)       [address]       [place], [place]  [address]  Ph: [phone]       Fax: [phone]   RxID:   [id number]

## 2010-02-04 NOTE — Miscellaneous (Signed)
  Clinical Lists Changes  Observations: Added new observation of ECHOINTERP: --------------------------------------------------------------------     Study Conclusions            - Left ventricle: The cavity size was normal. Systolic function was       normal. The estimated ejection fraction was in the range of 55% to       60%.     - Aortic valve: AV is thickened, calcified with mildly restricted       motion. Peak and mean gradients through the valve are 25 and 15 mm       Hg respectively consistent with mild AS. Mild regurgitation. Valve       area: 1.72cm 2(VTI). Valve area: 1.69cm 2 (Vmax).     - Mitral valve: Mild regurgitation.     - Left atrium: The atrium was moderately dilated.     - Right atrium: The atrium was mildly dilated.     - Pericardium, extracardiac: A trivial pericardial effusion was       identified. (03/09/2009 12:29)      Echocardiogram  Procedure date:  03/09/2009  Findings:      --------------------------------------------------------------------     Study Conclusions            - Left ventricle: The cavity size was normal. Systolic function was       normal. The estimated ejection fraction was in the range of 55% to       60%.     - Aortic valve: AV is thickened, calcified with mildly restricted       motion. Peak and mean gradients through the valve are 25 and 15 mm       Hg respectively consistent with mild AS. Mild regurgitation. Valve       area: 1.72cm 2(VTI). Valve area: 1.69cm 2 (Vmax).     - Mitral valve: Mild regurgitation.     - Left atrium: The atrium was moderately dilated.     - Right atrium: The atrium was mildly dilated.     - Pericardium, extracardiac: A trivial pericardial effusion was       identified.

## 2010-02-04 NOTE — Miscellaneous (Signed)
Summary: med change  Clinical Lists Changes  Medications: Changed medication from AMIODARONE HCL 400 MG TABS (AMIODARONE HCL) take one tablet by mouth daily to AMIODARONE HCL 200 MG TABS (AMIODARONE HCL) Take one tablet by mouth daily - Signed Rx of AMIODARONE HCL 200 MG TABS (AMIODARONE HCL) Take one tablet by mouth daily;  #30 x 6;  Signed;  Entered by: Sherri Rad, RN, BSN;  Authorized by: Lenoria Farrier, MD, [facility];  Method used: Electronically to [id number]*, [address], [place], [place]  [address], Ph: [phone], Fax: [phone]    Prescriptions: AMIODARONE HCL 200 MG TABS (AMIODARONE HCL) Take one tablet by mouth daily  #30 x 6   Entered by:   Sherri Rad, RN, BSN   Authorized by:   Lenoria Farrier, MD, [facility]   Signed by:   Sherri Rad, RN, BSN on 05/19/2009   Method used:   Electronically to        [facility] #[id number]* (retail)       [address] [place], [place]  [address]       Ph: [phone]       Fax: [phone]   RxID:   [id number]

## 2010-02-12 ENCOUNTER — Encounter (HOSPITAL_BASED_OUTPATIENT_CLINIC_OR_DEPARTMENT_OTHER): Payer: Medicare Other | Admitting: Oncology

## 2010-02-12 DIAGNOSIS — C8589 Other specified types of non-Hodgkin lymphoma, extranodal and solid organ sites: Secondary | ICD-10-CM

## 2010-02-12 DIAGNOSIS — Z452 Encounter for adjustment and management of vascular access device: Secondary | ICD-10-CM

## 2010-02-18 ENCOUNTER — Encounter: Payer: Self-pay | Admitting: Internal Medicine

## 2010-02-18 ENCOUNTER — Encounter (INDEPENDENT_AMBULATORY_CARE_PROVIDER_SITE_OTHER): Payer: Medicare Other

## 2010-02-18 DIAGNOSIS — Z7901 Long term (current) use of anticoagulants: Secondary | ICD-10-CM

## 2010-02-18 DIAGNOSIS — I4891 Unspecified atrial fibrillation: Secondary | ICD-10-CM

## 2010-02-18 LAB — CONVERTED CEMR LAB: POC INR: 2

## 2010-02-24 NOTE — Medication Information (Signed)
Summary: rov/sp  Anticoagulant Therapy  Managed by: Weston Brass, PharmD Referring MD: Charlies Constable, MD  PCP: Dr Christiana Fuchs MD: Myrtis Ser MD, Tinnie Gens Indication 1: Atrial Fibrillation Lab Used: [facility] Site: [address] INR POC 2.0 INR RANGE 2.0 - 3.0   Dietary changes: no    Health status changes: no    Bleeding/hemorrhagic complications: no    Recent/future hospitalizations: no    Any changes in medication regimen? no    Recent/future dental: no  Any missed doses?: no       Is patient compliant with meds? yes      Comments: Pt only took 1 tablet every day rather than 1 1/ 2 tablets on Monday and Friday.   Allergies: No Known Drug Allergies  Anticoagulation Management History:      The patient is taking warfarin and comes in today for a routine follow up visit.  Positive risk factors for bleeding include an age of 75 years or older.  The bleeding index is 'intermediate risk'.  Positive CHADS2 values include History of CHF, History of HTN, and Age > 75 years old.  His last INR was 1.5.  Anticoagulation responsible provider: Myrtis Ser MD, Tinnie Gens.  INR POC: 2.0.  Exp: 02/2011.    Anticoagulation Management Assessment/Plan:      The patient's current anticoagulation dose is Warfarin sodium 5 mg tabs: Take as directed by coumadin clinic..  The target INR is 2.0-3.0.  The next INR is due 03/18/2010.  Anticoagulation instructions were given to patient/spouse.  Results were reviewed/authorized by Weston Brass, PharmD.  He was notified by Weston Brass PharmD.         Prior Anticoagulation Instructions: INR 2.7 (goal INR: 2-3)  Take 1 tablet everyday except 1 and 1/2 tablets on Mondays and Fridays.  Recheck in 3 weeks.     Current Anticoagulation Instructions: INR 2.0  Restart previous dose of 1 tablet every day except 1 1/2 tablets on Monday and Friday.  Recheck INR in 4 weeks.

## 2010-03-22 LAB — BASIC METABOLIC PANEL
BUN: 32 mg/dL — ABNORMAL HIGH (ref 6–23)
Calcium: 9.1 mg/dL (ref 8.4–10.5)
GFR calc non Af Amer: 37 mL/min — ABNORMAL LOW (ref >60)
Glucose, Bld: 140 mg/dL — ABNORMAL HIGH (ref 70–99)
Potassium: 3.7 mEq/L (ref 3.5–5.1)

## 2010-03-22 LAB — GLUCOSE, CAPILLARY
Glucose-Capillary: 142 mg/dL — ABNORMAL HIGH (ref 70–99)
Glucose-Capillary: 142 mg/dL — ABNORMAL HIGH (ref 70–99)

## 2010-03-22 LAB — CBC
HCT: 39 % (ref 39.0–52.0)
Platelets: 222 10*3/uL (ref 150–400)
RDW: 21.2 % — ABNORMAL HIGH (ref 11.5–15.5)
WBC: 5.2 10*3/uL (ref 4.0–10.5)

## 2010-03-22 LAB — PROTIME-INR
INR: 2.89 — ABNORMAL HIGH (ref 0.00–1.49)
Prothrombin Time: 30 seconds — ABNORMAL HIGH (ref 11.6–15.2)

## 2010-03-22 LAB — APTT: aPTT: 57 seconds — ABNORMAL HIGH (ref 24–37)

## 2010-04-07 ENCOUNTER — Encounter: Payer: Self-pay | Admitting: Physician Assistant

## 2010-04-07 LAB — CROSSMATCH
ABO/RH(D): O NEG
Antibody Screen: NEGATIVE

## 2010-04-07 LAB — PREPARE PLATELETS

## 2010-04-09 LAB — CROSSMATCH: ABO/RH(D): O NEG

## 2010-04-10 LAB — CROSSMATCH: Antibody Screen: NEGATIVE

## 2010-04-10 LAB — ABO/RH: ABO/RH(D): O NEG

## 2010-04-12 LAB — BASIC METABOLIC PANEL
BUN: 22 mg/dL (ref 6–23)
BUN: 22 mg/dL (ref 6–23)
CO2: 26 mEq/L (ref 19–32)
CO2: 27 mEq/L (ref 19–32)
Calcium: 8.4 mg/dL (ref 8.4–10.5)
Chloride: 101 mEq/L (ref 96–112)
Chloride: 103 mEq/L (ref 96–112)
Chloride: 104 mEq/L (ref 96–112)
Chloride: 100 mEq/L (ref 96–112)
Creatinine, Ser: 1.13 mg/dL (ref 0.4–1.5)
Creatinine, Ser: 1.18 mg/dL (ref 0.4–1.5)
Creatinine, Ser: 1.18 mg/dL (ref 0.4–1.5)
GFR calc Af Amer: >60 mL/min (ref >60)
GFR calc Af Amer: >60 mL/min (ref >60)
GFR calc Af Amer: >60 mL/min (ref >60)
GFR calc non Af Amer: 56 mL/min — ABNORMAL LOW (ref >60)
Glucose, Bld: 213 mg/dL — ABNORMAL HIGH (ref 70–99)
Glucose, Bld: 219 mg/dL — ABNORMAL HIGH (ref 70–99)
Potassium: 3.8 mEq/L (ref 3.5–5.1)
Sodium: 139 mEq/L (ref 135–145)
Sodium: 134 mEq/L — ABNORMAL LOW (ref 135–145)

## 2010-04-12 LAB — GLUCOSE, CAPILLARY
Glucose-Capillary: 183 mg/dL — ABNORMAL HIGH (ref 70–99)
Glucose-Capillary: 186 mg/dL — ABNORMAL HIGH (ref 70–99)
Glucose-Capillary: 206 mg/dL — ABNORMAL HIGH (ref 70–99)
Glucose-Capillary: 206 mg/dL — ABNORMAL HIGH (ref 70–99)
Glucose-Capillary: 217 mg/dL — ABNORMAL HIGH (ref 70–99)
Glucose-Capillary: 228 mg/dL — ABNORMAL HIGH (ref 70–99)
Glucose-Capillary: 235 mg/dL — ABNORMAL HIGH (ref 70–99)
Glucose-Capillary: 254 mg/dL — ABNORMAL HIGH (ref 70–99)
Glucose-Capillary: 282 mg/dL — ABNORMAL HIGH (ref 70–99)
Glucose-Capillary: 340 mg/dL — ABNORMAL HIGH (ref 70–99)
Glucose-Capillary: 138 mg/dL — ABNORMAL HIGH (ref 70–99)
Glucose-Capillary: 213 mg/dL — ABNORMAL HIGH (ref 70–99)
Glucose-Capillary: 235 mg/dL — ABNORMAL HIGH (ref 70–99)
Glucose-Capillary: 266 mg/dL — ABNORMAL HIGH (ref 70–99)
Glucose-Capillary: 270 mg/dL — ABNORMAL HIGH (ref 70–99)

## 2010-04-12 LAB — CBC
HCT: 31.3 % — ABNORMAL LOW (ref 39.0–52.0)
HCT: 32.7 % — ABNORMAL LOW (ref 39.0–52.0)
Hemoglobin: 10.1 g/dL — ABNORMAL LOW (ref 13.0–17.0)
Hemoglobin: 10.4 g/dL — ABNORMAL LOW (ref 13.0–17.0)
MCHC: 31.8 g/dL (ref 30.0–36.0)
MCHC: 32.3 g/dL (ref 30.0–36.0)
MCHC: 32.4 g/dL (ref 30.0–36.0)
MCV: 76.1 fL — ABNORMAL LOW (ref 78.0–100.0)
MCV: 76.6 fL — ABNORMAL LOW (ref 78.0–100.0)
MCV: 76.8 fL — ABNORMAL LOW (ref 78.0–100.0)
MCV: 76.7 fL — ABNORMAL LOW (ref 78.0–100.0)
Platelets: 226 10*3/uL (ref 150–400)
RBC: 3.89 MIL/uL — ABNORMAL LOW (ref 4.22–5.81)
RBC: 3.96 MIL/uL — ABNORMAL LOW (ref 4.22–5.81)
RBC: 4.11 MIL/uL — ABNORMAL LOW (ref 4.22–5.81)
RBC: 4.17 MIL/uL — ABNORMAL LOW (ref 4.22–5.81)
RBC: 4.4 MIL/uL (ref 4.22–5.81)
RBC: 4.34 MIL/uL (ref 4.22–5.81)
RDW: 26.5 % — ABNORMAL HIGH (ref 11.5–15.5)
RDW: 27.5 % — ABNORMAL HIGH (ref 11.5–15.5)
RDW: 27.7 % — ABNORMAL HIGH (ref 11.5–15.5)
RDW: 27.8 % — ABNORMAL HIGH (ref 11.5–15.5)
WBC: 5.7 10*3/uL (ref 4.0–10.5)
WBC: 5.8 10*3/uL (ref 4.0–10.5)
WBC: 4.4 10*3/uL (ref 4.0–10.5)
WBC: 6.1 10*3/uL (ref 4.0–10.5)

## 2010-04-12 LAB — COMPREHENSIVE METABOLIC PANEL
ALT: 17 U/L (ref 0–53)
AST: 23 U/L (ref 0–37)
AST: 24 U/L (ref 0–37)
AST: 25 U/L (ref 0–37)
Albumin: 3.4 g/dL — ABNORMAL LOW (ref 3.5–5.2)
Albumin: 4 g/dL (ref 3.5–5.2)
BUN: 16 mg/dL (ref 6–23)
CO2: 25 mEq/L (ref 19–32)
Chloride: 103 mEq/L (ref 96–112)
Chloride: 101 mEq/L (ref 96–112)
Creatinine, Ser: 1.22 mg/dL (ref 0.4–1.5)
Creatinine, Ser: 1.35 mg/dL (ref 0.4–1.5)
GFR calc Af Amer: >60 mL/min (ref >60)
GFR calc Af Amer: >60 mL/min (ref >60)
GFR calc Af Amer: >60 mL/min (ref >60)
GFR calc non Af Amer: >60 mL/min (ref >60)
Sodium: 137 mEq/L (ref 135–145)
Total Bilirubin: 0.5 mg/dL (ref 0.3–1.2)
Total Bilirubin: 0.5 mg/dL (ref 0.3–1.2)
Total Protein: 6.6 g/dL (ref 6.0–8.3)
Total Protein: 8 g/dL (ref 6.0–8.3)

## 2010-04-12 LAB — DIFFERENTIAL
Basophils Absolute: 0 10*3/uL (ref 0.0–0.1)
Basophils Absolute: 0.1 10*3/uL (ref 0.0–0.1)
Basophils Relative: 1 % (ref 0–1)
Eosinophils Absolute: 0 10*3/uL (ref 0.0–0.7)
Eosinophils Relative: 5 % (ref 0–5)
Lymphs Abs: 0.2 10*3/uL — ABNORMAL LOW (ref 0.7–4.0)
Lymphs Abs: 0.7 10*3/uL (ref 0.7–4.0)
Monocytes Relative: 5 % (ref 3–12)
Neutro Abs: 5.3 10*3/uL (ref 1.7–7.7)
Neutro Abs: 4.7 10*3/uL (ref 1.7–7.7)

## 2010-04-12 LAB — LACTATE DEHYDROGENASE: LDH: 168 U/L (ref 94–250)

## 2010-04-13 LAB — CBC
HCT: 29.9 % — ABNORMAL LOW (ref 39.0–52.0)
Platelets: 301 10*3/uL (ref 150–400)
RDW: 18.2 % — ABNORMAL HIGH (ref 11.5–15.5)

## 2010-04-13 LAB — PROTIME-INR
INR: 1 (ref 0.00–1.49)
Prothrombin Time: 13.9 seconds (ref 11.6–15.2)

## 2010-04-13 LAB — GLUCOSE, CAPILLARY: Glucose-Capillary: 181 mg/dL — ABNORMAL HIGH (ref 70–99)

## 2010-04-29 ENCOUNTER — Ambulatory Visit (INDEPENDENT_AMBULATORY_CARE_PROVIDER_SITE_OTHER): Payer: Medicare Other | Admitting: *Deleted

## 2010-04-29 DIAGNOSIS — Z7901 Long term (current) use of anticoagulants: Secondary | ICD-10-CM

## 2010-04-29 DIAGNOSIS — I4891 Unspecified atrial fibrillation: Secondary | ICD-10-CM

## 2010-05-18 ENCOUNTER — Other Ambulatory Visit: Payer: Self-pay | Admitting: Oncology

## 2010-05-18 ENCOUNTER — Encounter (HOSPITAL_BASED_OUTPATIENT_CLINIC_OR_DEPARTMENT_OTHER): Payer: Medicare Other | Admitting: Oncology

## 2010-05-18 DIAGNOSIS — D709 Neutropenia, unspecified: Secondary | ICD-10-CM

## 2010-05-18 LAB — CBC WITH DIFFERENTIAL/PLATELET
Eosinophils Absolute: 0.2 10*3/uL (ref 0.0–0.5)
LYMPH%: 22.2 % (ref 14.0–49.0)
MCH: 27.4 pg (ref 27.2–33.4)
MCV: 84.6 fL (ref 79.3–98.0)
MONO%: 8.8 % (ref 0.0–14.0)
NEUT#: 4.2 10*3/uL (ref 1.5–6.5)
Platelets: 262 10*3/uL (ref 140–400)
RBC: 4.74 10*6/uL (ref 4.20–5.82)

## 2010-05-18 NOTE — Discharge Summary (Signed)
NAME:  Jesse Lambert, Jesse Lambert NO.:  [id number]   MEDICAL RECORD NO.:  [id number]          PATIENT TYPE:  INP   LOCATION:  1307                         FACILITY:  [facility]   PHYSICIAN:  Drue Second, MD       DATE OF BIRTH:  [birth year]   DATE OF ADMISSION:  06/16/2008  DATE OF DISCHARGE:  06/23/2008                               DISCHARGE SUMMARY   DISCHARGE DIAGNOSES:  1. 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  2. Spinal cord compression secondary to #1.  3. Atrial fibrillation, resolved.  4. Diabetes mellitus.  5. Hyperlipidemia.  6. Iron deficiency anemia.   CONDITION ON DISCHARGE:  Improved.   CONSULTATIONS:  1. Radene Gunning, MD, PhD, radiation oncology, consulted on June 16, 2008 to begin urgent radiation therapy to the spine.  2. Doylene Canning. Ladona Ridgel, MD, cardiology, consulted on June 18, 2008 for      atrial fibrillation.   PROCEDURES:  1. The patient had a Port-A-Cath insertion by interventional radiology      on June 18, 2008.  2. The patient received chemotherapy with Rituxan at 375 mg per miter      squared equals 800 mg IV on June 23, 2008.   HISTORY OF PRESENT ILLNESS:  Mr. Kulish is a 77 year old gentleman  who was recently diagnosed with high grade non-Hodgkin's lymphoma of the  right T10-T11 spine.  It was recommended that the patient begin  chemotherapy with CHOP and Rituxan beginning on June 17, 2008, however,  the patient called our office on June 16, 2008 reporting that he had  numbness to his feet and difficulty with ambulation.  He also had some  gait difficulties.  He was sent to the [facility] emergency  room for evaluation.  The patient had no other neurological symptoms  with the exception of the numbness in the bottom of the feet and gait  difficulties.  He had an MRI performed in the emergency room both of the  brain and thoracic spine.  The MRI of the brain showed possible mild  parenchymal enhancement but no  other abnormalities.  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  The  epidural tumor was present from T9 to T11.  The patient was admitted to  [facility] for management of his spinal cord compression.   HOSPITAL COURSE:  Mr. Abrahamsen was admitted to the inpatient oncology  unit and started on IV Dexamethasone and an urgent radiation oncology  consultation was called and performed.  He was also continued on all of  his home medications.  He was placed on sliding scale insulin as the  patient is a diabetic and was receiving high dose steroids.  The patient  was also given oxycodone as needed for his back pain.  The patient  started radiation therapy on June 17, 2008.  We also asked  interventional radiology to come see the patient for a  Port-A-Cath  placement which was performed on June 18, 2008.  During the Port-A-Cath  placement the patient became tachycardic.  An electrocardiogram was  performed at that time which showed that the patient was in atrial  fibrillation.  Following the Port-A-Cath placement, the patient was sent  to the step-down unit to be monitored on telemetry.  Cardiology  consulted on the patient and discontinued his Norvasc and gave him a  dose of Flecainide 150 mg at that time.  He was also started on Cardizem  10 mg IV per hour.  The patient rapidly converted back to normal sinus  rhythm and he was eventually transferred back to the regular oncology  unit.  The patient was maintained on radiation therapy during his  hospitalization and he tolerated this well.  His back pain significantly  improved and the numbness in his feet improved as well.  On June 23, 2008 the patient received his first dose of Rituxan for treatment of his  non-Hodgkin's lymphoma.  After completing his Rituxan the  patient was  deemed stable for discharge to home.  He was discharged to home with his  wife.   DISCHARGE LABORATORY DATA:  White blood cell count is 5.7, hemoglobin is  10.7, hematocrit 33.6, platelet count 211,000, ANC is 5.3.  Sodium 137,  potassium 4.0, glucose 254, BUN 22, creatinine 1.03.  Total bilirubin  0.5, alkaline phosphatase 52, AST 24, ALT 17, albumin 3.0, calcium 8.0.  LDH is normal at 136.  Ionized calcium is normal at 1.16.  His beta HCG  tumor marker was less than 0.5.   DISCHARGE MEDICATIONS:  1. Omeprazole 40 mg daily.  2. Glucophage XR 500 mg b.i.d.  3. Pravastatin 40 mg daily.  4. Oxycodone 5 mg p.o. p.r.n.  5. Allopurinol 100 mg p.o. daily.  6. Zofran 8 mg p.o. every 12 hours as needed for nausea.  7. Compazine 10 mg every 6 hours as needed for nausea.  8. Decadron 4 mg p.o. twice a day.   DISCHARGE INSTRUCTIONS:  1. The patient is to follow a low carbohydrate diet as he is a      diabetic.  2. He is to walk with assistance as he is still unsteady on his feet.  3. The patient will have a followup appointment at the [facility] on June 26, 2008 at 9:30 A.M.  The patient and his      wife were instructed to call the office, at [phone], if they have      any questions or problems      prior to that visit.  4. The patient will also continue on radiation therapy under the      direction of Dr. Dorothy Puffer.  The patient and his wife state      understanding of all the above instructions.      Myrtis Ser, NP      Drue Second, MD  Electronically Signed    [facility]/MEDQ  D:  06/27/2008  T:  06/27/2008  Job:  [id number]   cc:   Danae Orleans. Venetia Maxon, M.D.  Fax: [phone]   Stan Head. Cleta Alberts, M.D.  Fax: [phone]   Radene Gunning, MD, PhD  Fax: [phone]

## 2010-05-18 NOTE — H&P (Signed)
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   HISTORY OF PRESENT ILLNESS:  Jesse Lambert is a 77 year old gentleman  who was recently diagnosed with a high-grade non-Hodgkin's lymphoma of  the right T10-T11.  Jesse Lambert has been evaluated by Dr. Drue Second  in our office.  It was planned for him to have a Port-A-Cath placement  today as an outpatient and to begin chemotherapy in our office with [facility]  and Rituxan on June 17, 2008.  Jesse Lambert contacted our office this  morning with complaints of numbness to his feet and difficulty with  ambulation.  He was sent to the [facility] Emergency Room for  evaluation.  Today, Jesse Lambert reports a 1-2 day history of numbness  to the bottom of his feet and lower extremity weakness.  He reports that  he has been staggering around the house, but has not had any falls, but  he feels as though he might fall.  The patient still complains of pain  to his right mid back especially with lying down, but it is better when  he sits up.  The patient otherwise today has no fevers, chills or night  sweats.  No chest pain, shortness of breath, palpitations.  No abdominal  pain.  No nausea or vomiting.  No difficulty controlling his bowel or  bladder.  No headaches.  No visual disturbances.  No bleeding or easy  bruisability.  The remaining review of systems is negative.  Concern was  raised about a cord compression, therefore Jesse Lambert was sent to the  emergency room for further evaluation.   ALLERGIES:   NO KNOWN DRUG ALLERGIES.   MEDICATIONS:  1. Omeprazole 40 mg daily.  2. Glucophage XR 500 mg twice a day.  3. Pravastatin 40 mg daily.  4. Oxycodone 5 mg as needed for pain.  5. Allopurinol 100 mg daily.  6. Prednisone 100 mg daily, days 1-5 to begin with his chemotherapy      regimen on June 17, 2008.  7. Zofran 8 mg every 12 hours as needed for nausea.  8. Compazine 10 mg every 6 hours as needed for nausea.   PAST MEDICAL HISTORY:  1. Significant for hypertension.  2. Hyperlipidemia.  3. Non-insulin dependent diabetes mellitus.  4. Psoriasis.   PAST SURGICAL HISTORY:  None.   SOCIAL HISTORY:  The patient is retired.  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  He has a remote history of tobacco about 35  years ago.  No alcohol.   FAMILY HISTORY:  His father is deceased from complications of  Alzheimer's.  Mother deceased from  a CVA.  He had a 71 year old sister  who died of an MI and a 6 year old brother who died of an MI.   REVIEW OF SYSTEMS:  As noted in the HPI.   PHYSICAL EXAMINATION:  VITAL SIGNS:  Stable.  GENERAL:  The patient is awake and alert.  He is in no acute distress.  HEENT:  EOMI.  Oral mucosa is moist.  Posterior pharynx is clear.  LYMPHATICS:  There is no palpable cervical, supraclavicular, axillary,  inguinal lymphadenopathy.  LUNGS:  Clear to auscultation bilaterally.  No rales, wheezes or rhonchi  noted.  CARDIOVASCULAR:  Regular rate and rhythm.  No murmurs, gallops  or rubs.  ABDOMEN:  Soft, nontender, nondistended.  Bowel sounds are  present.  No hepatosplenomegaly.  EXTREMITIES:  No edema.  NEURO:  The patient is alert and oriented x3.  Cranial nerves II-XII are  grossly intact.  Strength is symmetrical in the upper and lower  extremities.  Sensation is intact.   LABORATORY DATA:  WBC is 6.1, hemoglobin 10.9, hematocrit 34.7,  platelets are 226,000, ANC is 4.7,  sodium 138, potassium 4.0, glucose  155, BUN 10, creatinine 1.35,  total bilirubin 0.5, alk phos 59, AST 25,  ALT 15, albumin 4.0, calcium 9.3, LDH is 168.   DIAGNOSTICS:  1. The patient had an MRI of the brain and thoracic spine with and      without contrast.  MRI of the brain shows possibly meningeal      enhancement.  Query recent lumbar puncture which could explain this      appearance, otherwise recommend attention on followup to exclude      early dural tumor involvement.  There is nonspecific      periventricular white matter signal hyperintensity most commonly      due to small vessel ischemia, otherwise no acute metastatic      intracranial abnormality.  2. MRI of the spine showed severe thoracic spinal cord compression      without definite cord edema related to the large T10 right      paraspinal tumor which infiltrates the epidural space by the right      T10 neural foramen.  The epidural dural tumor is present from T9-      T11.  There is involvement of the right tenth rib with tumor      tracking along the intercostal space.   IMPRESSION:  This is a 77 year old gentleman with high-grade non-  Hodgkin's lymphoma in the right thoracic spine.  The patient now  presents with numbness and weakness of the lower extremities with  evidence of cord compression on his MRI.   PLAN:  1. We will begin the patient on IV dexamethasone.  His first dose was      given in the emergency room.  We will begin to taper this during      his hospitalization.  2. The patient has a history of diabetes.  We will monitor his CBGs      and cover with sliding-scale insulin.  3. Radiation Oncology has been consulted to begin radiation therapy as      soon as possible for his cord compression.  4. We will consider adding rituximab with his radiation therapy.  The      patient will have a Port-A-Cath placed for chemotherapy      administration while he is hospitalized.  5. We will continue the patient's home medications.  6. The patient was started DVT  prophylaxis.  7. I have discussed the need for admission with the patient and his      wife who state understanding.  All their questions were answered to      their satisfaction.   Plan reviewed with Drue Second, MD.      Myrtis Ser, NP      Drue Second, MD  Electronically Signed    KRC/MEDQ  D:  06/17/2008  T:  06/17/2008  Job:  [id number]   cc:   Danae Orleans. Venetia Maxon, M.D.  Fax: [phone]   Stan Head. Cleta Alberts, M.D.  Fax: [phone]

## 2010-05-18 NOTE — Consult Note (Signed)
NAME:  Jesse Lambert, Jesse Lambert NO.:  [id number]   MEDICAL RECORD NO.:  [id number]          PATIENT TYPE:  INP   LOCATION:  1230                         FACILITY:  [facility]   PHYSICIAN:  Doylene Canning. Ladona Ridgel, MD    DATE OF BIRTH:  [birth year]   DATE OF CONSULTATION:  06/18/2008  DATE OF DISCHARGE:                                 CONSULTATION   REQUESTING PHYSICIAN:  Drue Second, MD   INDICATION FOR CONSULTATION:  Evaluation of new-onset atrial  fibrillation.   HISTORY OF PRESENT ILLNESS:  The patient is a 77 year old man who was  recently diagnosed with non-Hodgkin B-cell high-grade lymphoma of his  spine.  The patient underwent percutaneous biopsy which demonstrated  this.  He was then brought into the hospital for treatment.  He was on  radiation therapy and underwent insertion of a longstanding peripheral  central venous line and following the procedure, he was found to be in  atrial fibrillation with rapid ventricular response.  He has been  minimally symptomatic with this.  He denies chest pain.  He denies  shortness of breath.  He does feel very mild palpitations.  He has never  had any symptoms like this prior.  His additional past medical history  notable for diabetes.  He has a history of psoriasis.  He has a history  of microcytic anemia.  He has a history of hypertension.  He has a  history of dyslipidemia and a history of gastritis in the past.  He also  has a history of Alzheimer's dementia.   FAMILY HISTORY:  Strongly positive for coronary artery disease.   SOCIAL HISTORY:  He is a retired.  He is married.  He has remote tobacco  use.  He denies alcohol or drug use.   REVIEW OF SYSTEMS:  Except for the pain associated with his spinal  tumor, he has really been stable except for some mild skin changes from  his psoriasis.  He also has some arthritic complaints from gout.  These  are not active at present time, however.   PHYSICAL EXAMINATION:  GENERAL:  He is a  pleasant, well-appearing 77-  year-old man in no acute distress.  He looks somewhat younger than his  stated age.  VITAL SIGNS:  Blood pressure was 128/70, the pulse was 100  and irregularly irregular, respirations were 18, and temperature 90.  HEENT:  Normocephalic and atraumatic.  Pupils were equal and round.  Oropharynx was moist.  Sclerae were anicteric.  NECK:  No jugular venous distention.  There is no thyromegaly.  Trachea  midline.  Carotids are 2+ and symmetric.  LUNGS:  Clear bilaterally to auscultation.  No wheezes, rales, or  rhonchi are present.  There is no increased work of breathing.  CARDIAC:  Irregular rhythm with normal S1 and S2.  PMI was not enlarged  nor it was laterally displaced and there were no murmurs, rubs, or  gallops noted.  ABDOMEN:  Soft, nontender, and nondistended.  There is no organomegaly.  EXTREMITIES:  Demonstrate no cyanosis, clubbing, or edema.  Pulses are  2+ and symmetric.  NEUROLOGIC:  Alert  and oriented x3.  His cranial nerves are intact.  Strength is 5/5 and symmetric.   EKG demonstrates atrial fibrillation with nonspecific ST-T wave  abnormality.  A prior 2-D echo was done 2 days ago, the results of which  are pending at the present time.   IMPRESSION:  1. New-onset atrial fibrillation.  I suspect that this occurred in the      setting of the patient's J-wire tickling his right atrium as it was      inserted for his chronic central venous line.  My plan will be to      review his 2-D echo and if there is no severe left ventricular      dysfunction, plan on giving him oral flecainide and attempts to      chemically cardiovert the patient.  We will start him on some IV      Cardizem for rate control as needed.  2. Non-Hodgkin lymphoma.  At this point, the patient has been started      on XRT and will begin with chemotherapy in the immediate future.      Doylene Canning. Ladona Ridgel, MD  Electronically Signed     GWT/MEDQ  D:  06/18/2008  T:   06/19/2008  Job:  [id number]

## 2010-05-19 LAB — COMPREHENSIVE METABOLIC PANEL
Alkaline Phosphatase: 67 U/L (ref 39–117)
BUN: 20 mg/dL (ref 6–23)
Glucose, Bld: 213 mg/dL — ABNORMAL HIGH (ref 70–99)
Sodium: 143 mEq/L (ref 135–145)
Total Bilirubin: 0.4 mg/dL (ref 0.3–1.2)
Total Protein: 6.9 g/dL (ref 6.0–8.3)

## 2010-05-19 LAB — BETA 2 MICROGLOBULIN, SERUM: Beta-2 Microglobulin: 2.27 mg/L — ABNORMAL HIGH (ref 1.01–1.73)

## 2010-05-21 NOTE — Op Note (Signed)
NAME:  Jesse Lambert, Jesse Lambert NO.:  [id number]   MEDICAL RECORD NO.:  [id number]                   PATIENT TYPE:  AMB   LOCATION:  ENDO                                 FACILITY:  [facility]   PHYSICIAN:  Petra Kuba, M.D.                 DATE OF BIRTH:  11-Jul-1932   DATE OF PROCEDURE:  10/17/2001  DATE OF DISCHARGE:                                 OPERATIVE REPORT   PROCEDURE:  Colonoscopy with polypectomy.   INDICATIONS:  Patient with microcytic anemia, history of colon polyps,  overdue for colonic screening.  Consent was signed after risks, benefits,  methods, and options thoroughly discussed multiple times in the past.   MEDICATIONS:  Demerol 80 mg, Versed 8 mg.   DESCRIPTION OF PROCEDURE:  Rectal inspection was pertinent for external  hemorrhoids, small.  Digital exam was negative.  The regular colonoscope was  inserted, easily advanced around the colon to the cecum.  This did require  rolling him on his back and some abdominal pressure.  Other than an  occasional left-sided diverticulum, no other abnormality was seen on  insertion.  The cecum was identified by the appendiceal orifice and the  ileocecal valve.  The scope was slowly withdrawn.  The prep was adequate.  There was some liquid stool that required washing and suctioning.  On slow  withdrawal through the colon, the cecum, ascending, and transverse were  normal.  The occasional left-sided diverticula were confirmed.  At the  rectosigmoid junction a small polyp was seen and snared, electrocautery  applied, and the polyp was removed, suctioned through the scope, and  collected in the trap.  Another tiny to small rectal polyp was seen, snared.  Unfortunately, we cut through the polyp, which was suctioned through the  scope and collected in the trap.  No electrocautery was done.  The bleeding  from this stopped fairly readily, and we elected not to do anything further  based on removing seemingly  the polyp completely in its tiny nature.  The  scope was retroflexed, pertinent for some internal hemorrhoids.  The scope  was straightened and readvanced a short way up the left side of the colon,  air was suctioned, and the scope removed.  The patient tolerated the  procedure well.  There was no obvious immediate complication.   ENDOSCOPIC DIAGNOSES:  1. Internal-external hemorrhoids.  2. Occasional left-sided diverticula.  3. Rectal small and rectosigmoid junction polyps, both snared.     Electrocautery only done at the rectosigmoid junction polyp.  4. Otherwise within normal limits to the cecum.    PLAN:  Await pathology.  Would probably recheck colon screening in five  years and continue workup with an EGD with further workup and plans.  Please  see that dictation.  Petra Kuba, M.D.    MEM/MEDQ  D:  10/17/2001  T:  10/18/2001  Job:  [id number]   cc:   Gabriel Earing, MD  [address]  [place]  [place] [address]  Fax: [phone]

## 2010-05-25 ENCOUNTER — Encounter: Payer: Medicare Other | Admitting: Oncology

## 2010-05-25 ENCOUNTER — Other Ambulatory Visit: Payer: Self-pay | Admitting: Oncology

## 2010-05-25 DIAGNOSIS — C8590 Non-Hodgkin lymphoma, unspecified, unspecified site: Secondary | ICD-10-CM

## 2010-05-28 ENCOUNTER — Encounter: Payer: Medicare Other | Admitting: *Deleted

## 2010-06-09 ENCOUNTER — Encounter (HOSPITAL_COMMUNITY)
Admission: RE | Admit: 2010-06-09 | Discharge: 2010-06-09 | Disposition: A | Discharge status: Home / Self Care | Payer: Medicare Other | Source: Ambulatory Visit | Attending: Oncology | Admitting: Oncology

## 2010-06-09 ENCOUNTER — Encounter (HOSPITAL_COMMUNITY): Payer: Self-pay

## 2010-06-09 ENCOUNTER — Encounter: Payer: Self-pay | Admitting: Physician Assistant

## 2010-06-09 DIAGNOSIS — I700 Atherosclerosis of aorta: Secondary | ICD-10-CM | POA: Insufficient documentation

## 2010-06-09 DIAGNOSIS — J984 Other disorders of lung: Secondary | ICD-10-CM | POA: Insufficient documentation

## 2010-06-09 DIAGNOSIS — C8590 Non-Hodgkin lymphoma, unspecified, unspecified site: Secondary | ICD-10-CM

## 2010-06-09 DIAGNOSIS — J8410 Pulmonary fibrosis, unspecified: Secondary | ICD-10-CM | POA: Insufficient documentation

## 2010-06-09 DIAGNOSIS — J90 Pleural effusion, not elsewhere classified: Secondary | ICD-10-CM | POA: Insufficient documentation

## 2010-06-09 DIAGNOSIS — C8589 Other specified types of non-Hodgkin lymphoma, extranodal and solid organ sites: Secondary | ICD-10-CM | POA: Insufficient documentation

## 2010-06-09 HISTORY — DX: Malignant (primary) neoplasm, unspecified: C80.1

## 2010-06-09 LAB — GLUCOSE, CAPILLARY: Glucose-Capillary: 164 mg/dL — ABNORMAL HIGH (ref 70–99)

## 2010-06-09 MED ORDER — FLUDEOXYGLUCOSE F - 18 (FDG) INJECTION
16.5000 | Freq: Once | INTRAVENOUS | Status: AC | PRN
  Administered 2010-06-09: 16.5 via INTRAVENOUS

## 2010-06-09 MED ORDER — IOHEXOL 300 MG/ML  SOLN
100.0000 mL | Freq: Once | INTRAMUSCULAR | Status: AC | PRN
  Administered 2010-06-09: 100 mL via INTRAVENOUS

## 2010-06-11 ENCOUNTER — Other Ambulatory Visit: Payer: Self-pay

## 2010-06-11 MED ORDER — METOPROLOL SUCCINATE ER 50 MG PO TB24: 50.0000 mg | ORAL_TABLET | Freq: Every day | ORAL | Status: DC

## 2010-06-16 ENCOUNTER — Telehealth: Payer: Self-pay | Admitting: *Deleted

## 2010-06-16 NOTE — Telephone Encounter (Signed)
Past due for CVRR appt. Letter mailed.Jesse Lambert

## 2010-06-17 ENCOUNTER — Encounter: Payer: Self-pay | Admitting: Internal Medicine

## 2010-06-17 ENCOUNTER — Other Ambulatory Visit: Payer: Self-pay | Admitting: *Deleted

## 2010-06-17 MED ORDER — MAGNESIUM OXIDE 400 MG PO TABS: 400.0000 mg | ORAL_TABLET | Freq: Two times a day (BID) | ORAL | Status: DC

## 2010-06-18 ENCOUNTER — Ambulatory Visit (INDEPENDENT_AMBULATORY_CARE_PROVIDER_SITE_OTHER): Payer: Medicare Other | Admitting: *Deleted

## 2010-06-18 ENCOUNTER — Ambulatory Visit (INDEPENDENT_AMBULATORY_CARE_PROVIDER_SITE_OTHER): Payer: Medicare Other | Admitting: Physician Assistant

## 2010-06-18 ENCOUNTER — Encounter: Payer: Self-pay | Admitting: Physician Assistant

## 2010-06-18 VITALS — BP 136/78 | HR 83 | Ht 69.0 in | Wt 200.0 lb

## 2010-06-18 DIAGNOSIS — I5032 Chronic diastolic (congestive) heart failure: Secondary | ICD-10-CM

## 2010-06-18 DIAGNOSIS — R9389 Abnormal findings on diagnostic imaging of other specified body structures: Secondary | ICD-10-CM

## 2010-06-18 DIAGNOSIS — I509 Heart failure, unspecified: Secondary | ICD-10-CM

## 2010-06-18 DIAGNOSIS — I4891 Unspecified atrial fibrillation: Secondary | ICD-10-CM

## 2010-06-18 DIAGNOSIS — C8589 Other specified types of non-Hodgkin lymphoma, extranodal and solid organ sites: Secondary | ICD-10-CM

## 2010-06-18 DIAGNOSIS — Z7901 Long term (current) use of anticoagulants: Secondary | ICD-10-CM

## 2010-06-18 LAB — POCT INR: INR: 1.9

## 2010-06-18 MED ORDER — FUROSEMIDE 40 MG PO TABS: 60.0000 mg | ORAL_TABLET | Freq: Every day | ORAL | Status: DC

## 2010-06-18 NOTE — Assessment & Plan Note (Signed)
Rate control.  He remains on Coumadin.  His Coumadin was checked today by the Coumadin clinic and they made some adjustments.  Follow up with Dr. Johney Frame in 6-8 weeks.

## 2010-06-18 NOTE — Assessment & Plan Note (Signed)
I spoke with the nurse for his oncologist.  I had the physician assistant review the scans and his chart.  They contacted our office before the patient left to arrange a follow up with Dr. Park Breed early next week.

## 2010-06-18 NOTE — Progress Notes (Signed)
History of Present Illness: Primary Electrophysiologist:  Dr. Hillis Range  Jesse Lambert is a 77 y.o. male, Previously followed by Dr. Juanda Chance, who has a history of persistent now permanent atrial fibrillation and diastolic heart failure.  He had been initially treated with amiodarone and cardioversion.  However, he reverted back to atrial fibrillation now he is treated with rate control strategy.  Echocardiogram 3/11: EF 55-60%, mild aortic stenosis with a mean gradient of 15 and an aortic valve area of 1.7, mild mitral regurgitation, moderate LAE, mild RAE.  He was last seen by Dr. Juanda Chance in November 2011.  He was overall stable at that time from a cardiac perspective.  He was seen by Dr. Merla Riches recently for follow up.  He was noted to have rales in his bases on exam.  BNP was 465 and he was told to increase his Lasix.  He then went for follow up CT and PET scans for surveillance of his NHL.  He does not know the results.  He states that he feels the best he has in a "long while."  He denies dyspnea.  No chest pain.  No orthopnea, PND, weight gain or edema.  He denies syncope.  No palpitations.    Labs (06/09/10 at Dr. Netta Corrigan):  K 4, creatinine 1.19, ALT 19, Hgb 12.0, TSH 1.702, BNP 465, A1C 7.2.  Chest and abdominal CT 06/09/10:  Interval development of bilateral multifocal pulmonary consolidations. Differential includes lymphoma involvement or atypical infection.  Trace bilateral pleural effusions.  PET scan (06/09/10):  1. Bilateral pulmonary nodules/masses which are suspicious for lymphoma metastasis to the pulmonary parenchyma. Alternatively these may represent areas of atypical infection. Biopsy may be necessary to confirm.  2. Small bilateral pleural effusions.  3. Pulmonary fibrosis.   Past Medical History  Diagnosis Date  . Diabetes mellitus   . Non Hodgkin's lymphoma     B cell, high-grade  . Atrial fibrillation     Permanent, Coumadin therapy  . HTN (hypertension)   .  Chronic diastolic heart failure   . Anemia   . Dementia   . DM2 (diabetes mellitus, type 2)     Current Outpatient Prescriptions  Medication Sig Dispense Refill  . Cyanocobalamin (VITAMIN B-12 CR PO) Take by mouth daily.        . digoxin (LANOXIN) 0.125 MG tablet Take 125 mcg by mouth daily.        . ferrous sulfate (MEIJER FERROUS SULFATE) 325 (65 FE) MG tablet Take 325 mg by mouth daily with breakfast. OUT       . furosemide (LASIX) 40 MG tablet Take 1.5 tablets (60 mg total) by mouth daily.  135 tablet  3  . Multiple Vitamin (MULTIVITAMIN) tablet Take 1 tablet by mouth daily.        Marland Kitchen warfarin (COUMADIN) 5 MG tablet Take by mouth as directed.        Marland Kitchen DISCONTD: furosemide (LASIX) 40 MG tablet Take 60 mg by mouth daily.       . metoprolol (TOPROL-XL) 50 MG 24 hr tablet Take 1 tablet (50 mg total) by mouth daily.  90 tablet  3  . DISCONTD: magnesium oxide (MAG-OX 400) 400 MG tablet Take 1 tablet (400 mg total) by mouth 2 (two) times daily.  60 tablet  5    Allergies: No Known Allergies  Social Hx:  Married.  No smoking.  ROS:  No fevers, chills, cough, hemoptysis, melena, hematochezia, vomiting or diarrhea.  All other ROS reviewed and  negative.  Vital Signs: BP 136/78  Pulse 83  Ht 5\' 9"  (1.753 m)  Wt 200 lb (90.719 kg)  BMI 29.53 kg/m2  PHYSICAL EXAM: Well nourished, well developed, in no acute distress HEENT: normal Neck: no JVD At 45 Endocrine: No thyromegaly Cardiac:  normal S1, S2; Irregularly irregular rhythm; 2/6 Systolic murmur Heard best on the left lower sternal border Lungs:  bibasilar crackles, right greater than left, No wheezing, no rhonchi Abd: soft, nontender, no hepatomegaly Ext: no edema Skin: warm and dry Neuro:  CNs 2-12 intact, no focal abnormalities noted Psych: Pleasant, normal affect  EKG:  Atrial fibrillation, heart rate 88, left axis deviation, nonspecific ST-T wave changes, no significant change since previous tracing  ASSESSMENT AND  PLAN:

## 2010-06-18 NOTE — Assessment & Plan Note (Signed)
His abnormal lung exam is likely explained by the consolidation/masses noted on CT and PET scan.  I suspect that his BNP is somewhat elevated from the associated pleural effusions as well as chronic atrial fibrillation.  I have asked him to take Lasix 60 mg a day.  He can follow up in a week with a basic metabolic panel.

## 2010-06-18 NOTE — Patient Instructions (Signed)
Your physician recommends that you schedule a follow-up appointment in: 6-8 weeks with Dr Johney Frame Your physician recommends that you schedule a follow-up appointment with Dr Welton Flakes on Mon 6/18 at 10:00  Your physician has recommended you make the following change in your medication: change Lasix to 60 mg (1 1/2) tab daily  Your physician recommends that you return for lab work in: 1 week

## 2010-06-18 NOTE — Assessment & Plan Note (Signed)
CT and PET scan concerning for lymphoma metastasis.  I suspect his abnormal lung exam is explained mainly by this.  I suspect that his effusions are associated with this and not diastolic heart failure.  I discussed the findings of his CT scan with the patient and his wife today.  I explained that this could represent recurrent lymphoma or some type of atypical infection.  He does not have any fevers or cough.  At this point, I do not think he has an infection.

## 2010-06-21 ENCOUNTER — Other Ambulatory Visit: Payer: Self-pay | Admitting: Oncology

## 2010-06-21 ENCOUNTER — Encounter (HOSPITAL_BASED_OUTPATIENT_CLINIC_OR_DEPARTMENT_OTHER): Payer: Medicare Other | Admitting: Oncology

## 2010-06-21 DIAGNOSIS — J181 Lobar pneumonia, unspecified organism: Secondary | ICD-10-CM

## 2010-06-21 DIAGNOSIS — C8589 Other specified types of non-Hodgkin lymphoma, extranodal and solid organ sites: Secondary | ICD-10-CM

## 2010-06-22 ENCOUNTER — Telehealth: Payer: Self-pay | Admitting: Internal Medicine

## 2010-06-22 MED ORDER — DIGOXIN 125 MCG PO TABS: 125.0000 ug | ORAL_TABLET | Freq: Every day | ORAL | Status: DC

## 2010-06-22 NOTE — Telephone Encounter (Signed)
Digoxin 0.125 mg. [facility] on [address] / [address].

## 2010-06-22 NOTE — Telephone Encounter (Signed)
Digoxin sent in for patient

## 2010-06-22 NOTE — Telephone Encounter (Signed)
Radiology wants to know if pt is aware that he needs to stop his coumadin before his procedure on 6/25.

## 2010-06-23 ENCOUNTER — Telehealth: Payer: Self-pay | Admitting: Internal Medicine

## 2010-06-23 NOTE — Telephone Encounter (Signed)
PT NEEDS HIS DIGOXIN TO BE CALL IN TO [facility] # [phone]

## 2010-06-23 NOTE — Telephone Encounter (Signed)
Need pres to [facility] [place] for Digoxin.  Pl call her [phone].   She said that someone has just called her.

## 2010-06-23 NOTE — Telephone Encounter (Signed)
Talk with pt and told him the medication is sent into the pharmacy

## 2010-06-28 ENCOUNTER — Other Ambulatory Visit: Payer: Self-pay | Admitting: Interventional Radiology

## 2010-06-28 ENCOUNTER — Ambulatory Visit (HOSPITAL_COMMUNITY)
Admission: RE | Admit: 2010-06-28 | Discharge: 2010-06-28 | Disposition: A | Discharge status: Home / Self Care | Payer: Medicare Other | Source: Ambulatory Visit | Attending: Interventional Radiology | Admitting: Interventional Radiology

## 2010-06-28 ENCOUNTER — Ambulatory Visit (HOSPITAL_COMMUNITY)
Admission: RE | Admit: 2010-06-28 | Discharge: 2010-06-28 | Disposition: A | Discharge status: Home / Self Care | Payer: Medicare Other | Source: Ambulatory Visit | Attending: Oncology | Admitting: Oncology

## 2010-06-28 DIAGNOSIS — Z01818 Encounter for other preprocedural examination: Secondary | ICD-10-CM | POA: Insufficient documentation

## 2010-06-28 DIAGNOSIS — Z01812 Encounter for preprocedural laboratory examination: Secondary | ICD-10-CM | POA: Insufficient documentation

## 2010-06-28 DIAGNOSIS — Z79899 Other long term (current) drug therapy: Secondary | ICD-10-CM | POA: Insufficient documentation

## 2010-06-28 DIAGNOSIS — J984 Other disorders of lung: Secondary | ICD-10-CM | POA: Insufficient documentation

## 2010-06-28 DIAGNOSIS — J181 Lobar pneumonia, unspecified organism: Secondary | ICD-10-CM

## 2010-06-28 LAB — BASIC METABOLIC PANEL
CO2: 26 mEq/L (ref 19–32)
Calcium: 8.8 mg/dL (ref 8.4–10.5)
Creatinine, Ser: 1.14 mg/dL (ref 0.50–1.35)
GFR calc non Af Amer: >60 mL/min (ref >60)
Glucose, Bld: 156 mg/dL — ABNORMAL HIGH (ref 70–99)
Sodium: 139 mEq/L (ref 135–145)

## 2010-06-28 LAB — CBC
MCH: 26.3 pg (ref 26.0–34.0)
MCV: 82.1 fL (ref 78.0–100.0)
Platelets: 240 10*3/uL (ref 150–400)
RBC: 4.53 MIL/uL (ref 4.22–5.81)
RDW: 15.2 % (ref 11.5–15.5)
WBC: 6 10*3/uL (ref 4.0–10.5)

## 2010-06-28 LAB — APTT: aPTT: 37 seconds (ref 24–37)

## 2010-06-28 LAB — PROTIME-INR: Prothrombin Time: 16.9 seconds — ABNORMAL HIGH (ref 11.6–15.2)

## 2010-06-29 ENCOUNTER — Other Ambulatory Visit (INDEPENDENT_AMBULATORY_CARE_PROVIDER_SITE_OTHER): Payer: Medicare Other | Admitting: *Deleted

## 2010-06-29 DIAGNOSIS — Z7901 Long term (current) use of anticoagulants: Secondary | ICD-10-CM

## 2010-06-29 DIAGNOSIS — I509 Heart failure, unspecified: Secondary | ICD-10-CM

## 2010-06-29 DIAGNOSIS — I4891 Unspecified atrial fibrillation: Secondary | ICD-10-CM

## 2010-06-29 DIAGNOSIS — I5032 Chronic diastolic (congestive) heart failure: Secondary | ICD-10-CM

## 2010-06-29 LAB — BASIC METABOLIC PANEL
CO2: 27 mEq/L (ref 19–32)
Chloride: 105 mEq/L (ref 96–112)
Creatinine, Ser: 1.5 mg/dL (ref 0.4–1.5)
Potassium: 4.3 mEq/L (ref 3.5–5.1)

## 2010-07-01 LAB — TISSUE CULTURE

## 2010-07-05 ENCOUNTER — Encounter (HOSPITAL_BASED_OUTPATIENT_CLINIC_OR_DEPARTMENT_OTHER): Payer: Medicare Other | Admitting: Oncology

## 2010-07-05 DIAGNOSIS — C8589 Other specified types of non-Hodgkin lymphoma, extranodal and solid organ sites: Secondary | ICD-10-CM

## 2010-07-09 ENCOUNTER — Ambulatory Visit (INDEPENDENT_AMBULATORY_CARE_PROVIDER_SITE_OTHER): Payer: Medicare Other | Admitting: *Deleted

## 2010-07-09 DIAGNOSIS — I4891 Unspecified atrial fibrillation: Secondary | ICD-10-CM

## 2010-07-09 LAB — POCT INR: INR: 1.6

## 2010-07-13 IMAGING — PT NM PET TUM IMG RESTAG (PS) SKULL BASE T - THIGH
6 series · 25 of 25 positions shown · non-contrast
Comparison: PET CT 06/05/2008.  Diagnostic CT chest abdomen pelvis
12/22/2008.

CLINICAL DATA: Subsequent treatment strategy for lymphoma.  Non-
Hodgkin's lymphoma originally diagnosed in May 2008, for which the
patient completed radiation therapy and chemotherapy in December 2008.  Restaging.

NUCLEAR MEDICINE PET CT RESTAGING (PS) SKULL BASE TO THIGH
05/22/2009:
TECHNIQUE: 15.4 mCi F-18 FDG was injected intravenously via the
left antecubital vein.  Full-ring PET imaging was performed from
the skull base through the mid-thighs 68  minutes after injection.
CT data was obtained and used for attenuation correction and
anatomic localization only.  (This was not acquired as a diagnostic
CT examination.)
Fasting Blood Glucose:  142

[Series 1: pet ac · axial · 3.3mm · 4.69mm/px · z∈[-870,+0]mm · 5 of 267 slices shown]
[im 1/267]
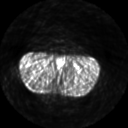
[im 67/267]
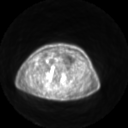
[im 134/267]
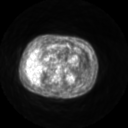
[im 200/267]
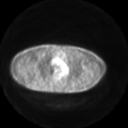
[im 267/267]
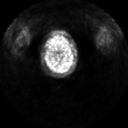

[Series 2: ct images · axial · 3.8mm · 0.98mm/px · z∈[-870,+0]mm · 5 of 267 slices shown]
[im 1/267]
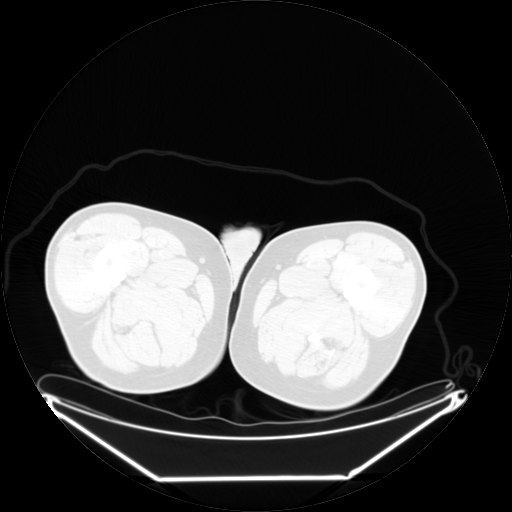
[im 67/267]
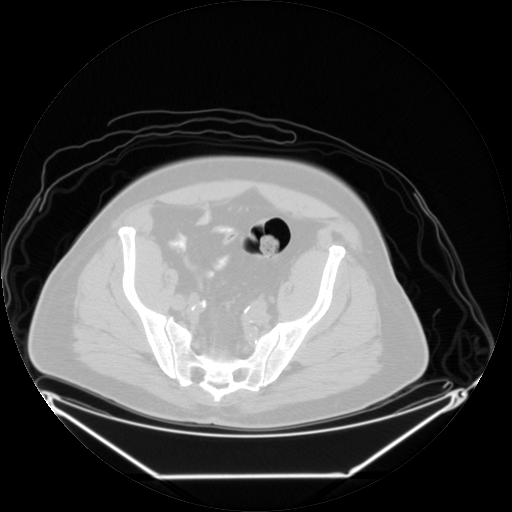
[im 134/267]
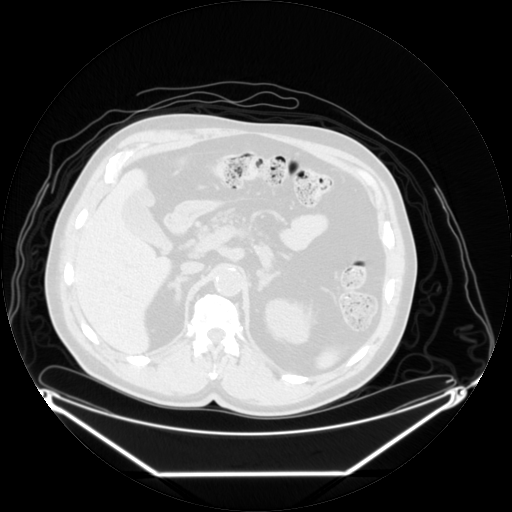
[im 200/267]
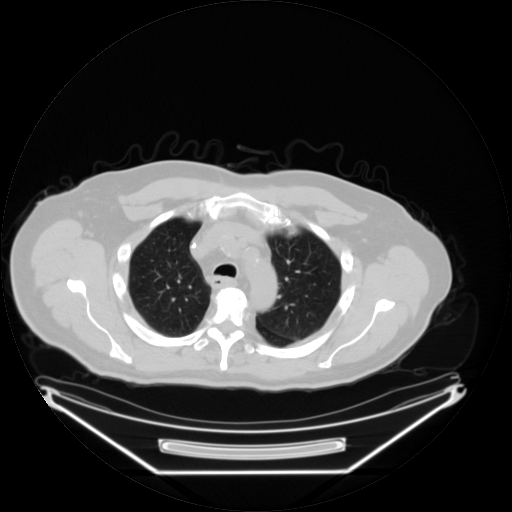
[im 267/267  brain]
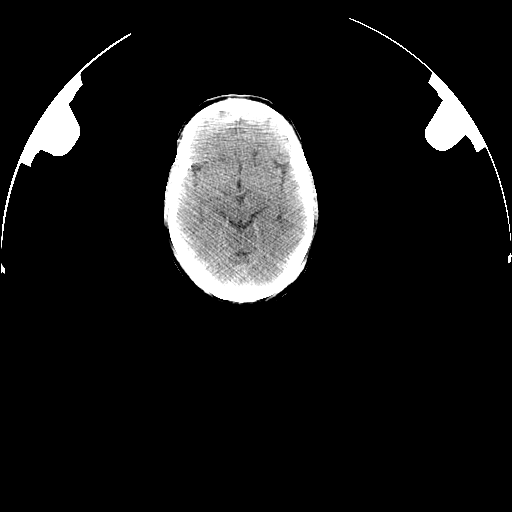

[Series 2: pet nac · axial · 3.3mm · 4.69mm/px · z∈[-870,+0]mm · 6 of 267 slices shown]
[im 1/267]
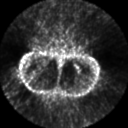
[im 54/267]
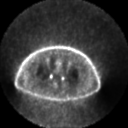
[im 107/267]
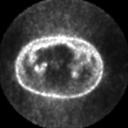
[im 160/267]
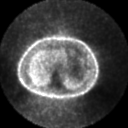
[im 213/267]
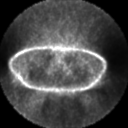
[im 267/267]
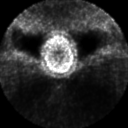

[Series 123: mip · coronal · 3.3mm · 4.69mm/px · 1 of 30 slices shown]
[im 1/30]
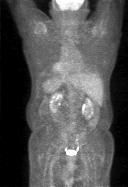

[Series 151: reformatted · axial · 3.3mm · 3.91mm/px · z∈[-870,+0]mm · 6 of 265 slices shown (1 of 2)]
[im 1/265]
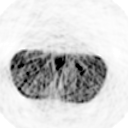
[im 53/265]
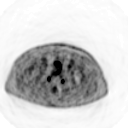
[im 106/265]
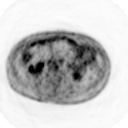
[im 159/265]
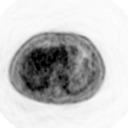
[im 212/265]
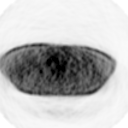
[im 265/265]
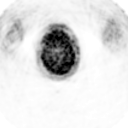

[Series 153: reformatted · coronal · 4.7mm · 6.98mm/px · 2 of 73 slices shown (2 of 2)]
[im 1/73]
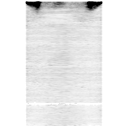
[im 73/73]
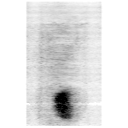

[25 of 25 positions shown; findings below may reference images not displayed]

FINDINGS: Interval complete metabolic response to therapy of the
paraspinous mass at the T10-11 area, with no residual activity
above baseline.  Interval complete response to therapy of the small
focus of hypermetabolic activity involving the right seventh rib
interspace.  No new abnormal hypermetabolic activity in the neck,
chest, abdomen or pelvis to suggest recurrent lymphoma.  No
abnormal osseous activity.

Unenhanced CT images demonstrate cardiomegaly, atelectasis and
probable post-radiation fibrosis in the right lower lobe, stable
diffuse pancreatic atrophy with fatty infiltration, stable
bilateral renal cysts, sigmoid colon diverticulosis, stable right
common iliac artery aneurysm, stable mild bladder wall thickening,
and stable mild prostate gland enlargement.
IMPRESSION: 1.  Interval complete metabolic response to therapy of the
paraspinous lymphoma at T10-11 and the small focus of presumed
lymphoma in the right seventh rib interspace.
2.  No evidence of hypermetabolic lymphoma currently in the neck,
chest, abdomen or pelvis.

## 2010-07-19 ENCOUNTER — Other Ambulatory Visit: Payer: Self-pay | Admitting: Internal Medicine

## 2010-07-19 NOTE — Telephone Encounter (Signed)
rx refill

## 2010-07-28 ENCOUNTER — Ambulatory Visit (INDEPENDENT_AMBULATORY_CARE_PROVIDER_SITE_OTHER): Payer: Medicare Other | Admitting: Internal Medicine

## 2010-07-28 ENCOUNTER — Encounter: Payer: Self-pay | Admitting: Internal Medicine

## 2010-07-28 ENCOUNTER — Ambulatory Visit (INDEPENDENT_AMBULATORY_CARE_PROVIDER_SITE_OTHER): Payer: Medicare Other | Admitting: *Deleted

## 2010-07-28 VITALS — BP 136/91 | HR 103 | Ht 69.0 in | Wt 200.0 lb

## 2010-07-28 DIAGNOSIS — I4891 Unspecified atrial fibrillation: Secondary | ICD-10-CM

## 2010-07-28 DIAGNOSIS — Z7901 Long term (current) use of anticoagulants: Secondary | ICD-10-CM

## 2010-07-28 DIAGNOSIS — I10 Essential (primary) hypertension: Secondary | ICD-10-CM

## 2010-07-28 DIAGNOSIS — I509 Heart failure, unspecified: Secondary | ICD-10-CM

## 2010-07-28 DIAGNOSIS — I5032 Chronic diastolic (congestive) heart failure: Secondary | ICD-10-CM

## 2010-07-28 LAB — POCT INR: INR: 2.1

## 2010-07-28 NOTE — Assessment & Plan Note (Signed)
Stable No change required today  

## 2010-07-28 NOTE — Patient Instructions (Signed)
Your physician recommends that you schedule a follow-up appointment in: 2 months with Dr Johney Frame  Your physician recommends that you return for lab work today  BMP

## 2010-07-28 NOTE — Assessment & Plan Note (Signed)
Permanent Asymptomatic Continue coumadin and rate control   

## 2010-07-28 NOTE — Progress Notes (Signed)
The patient presents today for routine electrophysiology followup.  Since last being seen in our clinic, the patient reports doing very well.   He remains active.  He is unaware of symptoms with afib.  Today, he denies symptoms of palpitations, chest pain, shortness of breath, orthopnea, PND, lower extremity edema, dizziness, presyncope, syncope, or neurologic sequela.  The patient feels that he is tolerating medications without difficulties and is otherwise without complaint today.   Past Medical History  Diagnosis Date  . Diabetes mellitus   . Non Hodgkin's lymphoma     B cell, high-grade  . Atrial fibrillation     Permanent, Coumadin therapy  . HTN (hypertension)   . Chronic diastolic heart failure   . Anemia   . Dementia   . DM2 (diabetes mellitus, type 2)    No past surgical history on file.  Current Outpatient Prescriptions  Medication Sig Dispense Refill  . Cyanocobalamin (VITAMIN B-12 CR PO) Take by mouth daily.        . digoxin (LANOXIN) 0.125 MG tablet Take 1 tablet (125 mcg total) by mouth daily.  30 tablet  11  . ferrous sulfate (MEIJER FERROUS SULFATE) 325 (65 FE) MG tablet Take 325 mg by mouth daily with breakfast. OUT       . furosemide (LASIX) 40 MG tablet Take 1.5 tablets (60 mg total) by mouth daily.  135 tablet  3  . metFORMIN (GLUCOPHAGE-XR) 500 MG 24 hr tablet 3 TIMES DAILY      . Multiple Vitamin (MULTIVITAMIN) tablet Take 1 tablet by mouth daily.        Marland Kitchen warfarin (COUMADIN) 5 MG tablet TAKE AS DIRECTED BY COUMADIN CLINIC  45 tablet  2    No Known Allergies  History   Social History  . Marital Status: Married    Spouse Name: N/A    Number of Children: N/A  . Years of Education: N/A   Occupational History  . Not on file.   Social History Main Topics  . Smoking status: Former Smoker    Types: Cigarettes    Quit date: 07/28/1970  . Smokeless tobacco: Not on file  . Alcohol Use: No  . Drug Use: No  . Sexually Active: Not on file   Other Topics  Concern  . Not on file   Social History Narrative   Married, retired from [employer], gets regular exercise.     Family History  Problem Relation Age of Onset  . Heart attack Brother     and sister  . Diabetes Other     famiyl hx   Physical Exam: Filed Vitals:   07/28/10 1455  BP: 136/91  Pulse: 103  Height: 5\' 9"  (1.753 m)  Weight: 200 lb (90.719 kg)    GEN- The patient is well appearing, alert and oriented x 3 today.   Head- normocephalic, atraumatic Eyes-  Sclera clear, conjunctiva pink Ears- hearing intact Oropharynx- clear Neck- supple, no JVP Lymph- no cervical lymphadenopathy Lungs- dry coarse rales bilaterally , normal work of breathing Heart- irregular rate and rhythm, no murmurs, rubs or gallops, PMI not laterally displaced GI- soft, NT, ND, + BS Extremities- no clubbing, cyanosis, or edema MS- no significant deformity or atrophy Skin- no rash or lesion Psych- euthymic mood, full affect Neuro- strength and sensation are intact  ekg today reveals afib, V rate 81, LVH, nonspecific St/T changes  Assessment and Plan:

## 2010-07-28 NOTE — Assessment & Plan Note (Signed)
euvolemic today Check BMET today

## 2010-07-29 LAB — BASIC METABOLIC PANEL
BUN: 20 mg/dL (ref 6–23)
Calcium: 9.1 mg/dL (ref 8.4–10.5)
Creatinine, Ser: 1.4 mg/dL (ref 0.4–1.5)
GFR: 61.25 mL/min (ref >60.00)

## 2010-08-10 LAB — AFB CULTURE WITH SMEAR (NOT AT ARMC): Acid Fast Smear: NONE SEEN

## 2010-08-25 ENCOUNTER — Ambulatory Visit (INDEPENDENT_AMBULATORY_CARE_PROVIDER_SITE_OTHER): Payer: Medicare Other | Admitting: *Deleted

## 2010-08-25 DIAGNOSIS — I4891 Unspecified atrial fibrillation: Secondary | ICD-10-CM

## 2010-08-25 DIAGNOSIS — Z7901 Long term (current) use of anticoagulants: Secondary | ICD-10-CM

## 2010-08-25 LAB — POCT INR: INR: 2.8

## 2010-09-27 ENCOUNTER — Ambulatory Visit (INDEPENDENT_AMBULATORY_CARE_PROVIDER_SITE_OTHER): Payer: Medicare Other | Admitting: Internal Medicine

## 2010-09-27 ENCOUNTER — Encounter: Payer: Self-pay | Admitting: Internal Medicine

## 2010-09-27 ENCOUNTER — Ambulatory Visit (INDEPENDENT_AMBULATORY_CARE_PROVIDER_SITE_OTHER): Payer: Medicare Other | Admitting: *Deleted

## 2010-09-27 VITALS — BP 134/83 | HR 57 | Ht 69.0 in | Wt 206.0 lb

## 2010-09-27 DIAGNOSIS — I4891 Unspecified atrial fibrillation: Secondary | ICD-10-CM

## 2010-09-27 DIAGNOSIS — I5032 Chronic diastolic (congestive) heart failure: Secondary | ICD-10-CM

## 2010-09-27 DIAGNOSIS — I5033 Acute on chronic diastolic (congestive) heart failure: Secondary | ICD-10-CM

## 2010-09-27 DIAGNOSIS — I509 Heart failure, unspecified: Secondary | ICD-10-CM

## 2010-09-27 DIAGNOSIS — I10 Essential (primary) hypertension: Secondary | ICD-10-CM

## 2010-09-27 DIAGNOSIS — Z7901 Long term (current) use of anticoagulants: Secondary | ICD-10-CM

## 2010-09-27 LAB — POCT INR: INR: 2

## 2010-09-27 MED ORDER — FUROSEMIDE 40 MG PO TABS: ORAL_TABLET | ORAL | Status: DC

## 2010-09-27 NOTE — Progress Notes (Signed)
The patient presents today for routine electrophysiology followup.  Since last being seen in our clinic, the patient reports doing very well.   He remains active.  He is unaware of symptoms with afib.  His SOB is better, though he has gained 6 lbs since last visit.  Today, he denies symptoms of palpitations, chest pain, orthopnea, PND, lower extremity edema, dizziness, presyncope, syncope, or neurologic sequela.  The patient feels that he is tolerating medications without difficulties and is otherwise without complaint today.   Past Medical History  Diagnosis Date  . Diabetes mellitus   . Non Hodgkin's lymphoma     B cell, high-grade  . Atrial fibrillation     Permanent, Coumadin therapy  . HTN (hypertension)   . Chronic diastolic heart failure   . Anemia   . Dementia   . DM2 (diabetes mellitus, type 2)    No past surgical history on file.  Current Outpatient Prescriptions  Medication Sig Dispense Refill  . Cyanocobalamin (VITAMIN B-12 CR PO) Take by mouth daily.        . digoxin (LANOXIN) 0.125 MG tablet Take 1 tablet (125 mcg total) by mouth daily.  30 tablet  11  . ferrous sulfate (MEIJER FERROUS SULFATE) 325 (65 FE) MG tablet Take 325 mg by mouth daily with breakfast.       . furosemide (LASIX) 40 MG tablet Take 2 tablets every morning  180 tablet  3  . metFORMIN (GLUCOPHAGE-XR) 500 MG 24 hr tablet 3 TIMES DAILY      . Multiple Vitamin (MULTIVITAMIN) tablet Take 1 tablet by mouth daily.        Marland Kitchen warfarin (COUMADIN) 5 MG tablet TAKE AS DIRECTED BY COUMADIN CLINIC  45 tablet  2    No Known Allergies  History   Social History  . Marital Status: Married    Spouse Name: N/A    Number of Children: N/A  . Years of Education: N/A   Occupational History  . Not on file.   Social History Main Topics  . Smoking status: Former Smoker    Types: Cigarettes    Quit date: 07/28/1970  . Smokeless tobacco: Not on file  . Alcohol Use: No  . Drug Use: No  . Sexually Active: Not on file    Other Topics Concern  . Not on file   Social History Narrative   Married, retired from [employer], gets regular exercise.     Family History  Problem Relation Age of Onset  . Heart attack Brother     and sister  . Diabetes Other     famiyl hx   Physical Exam: Filed Vitals:   09/27/10 1455  BP: 134/83  Pulse: 57  Height: 5\' 9"  (1.753 m)  Weight: 206 lb (93.441 kg)    GEN- The patient is well appearing, alert and oriented x 3 today.   Head- normocephalic, atraumatic Eyes-  Sclera clear, conjunctiva pink Ears- hearing intact Oropharynx- clear Neck- supple, JVP 9cm Lymph- no cervical lymphadenopathy Lungs- dry coarse rales bilaterally , normal work of breathing Heart- irregular rate and rhythm, no murmurs, rubs or gallops, PMI not laterally displaced GI- soft, NT, ND, + BS Extremities- no clubbing, cyanosis, 2+ edema MS- no significant deformity or atrophy Skin- no rash or lesion Psych- euthymic mood, full affect Neuro- strength and sensation are intact  Assessment and Plan:

## 2010-09-27 NOTE — Patient Instructions (Signed)
Your physician recommends that you schedule a follow-up appointment in: 6 weeks with Dr Johney Frame  2 gram Sodium diet  Your physician recommends that you return for lab work today--BMP  Your physician has recommended you make the following change in your medication: increase Furosemide to 80mg  daily-- all in the morning

## 2010-09-27 NOTE — Assessment & Plan Note (Signed)
Volume overloaded with 6 lb weight gain Check BMET today Increase lasix to 80mg  daily  2 gram sodium restriction advised

## 2010-09-27 NOTE — Assessment & Plan Note (Signed)
Stable No change required today  

## 2010-09-27 NOTE — Assessment & Plan Note (Signed)
Permanent Asymptomatic Continue coumadin and rate control

## 2010-09-28 LAB — BASIC METABOLIC PANEL
Calcium: 9.3 mg/dL (ref 8.4–10.5)
GFR: 62.73 mL/min (ref >60.00)
Glucose, Bld: 159 mg/dL — ABNORMAL HIGH (ref 70–99)
Potassium: 3.6 mEq/L (ref 3.5–5.1)
Sodium: 144 mEq/L (ref 135–145)

## 2010-10-25 ENCOUNTER — Encounter: Payer: Medicare Other | Admitting: *Deleted

## 2010-11-29 ENCOUNTER — Other Ambulatory Visit: Payer: Self-pay | Admitting: Internal Medicine

## 2010-12-01 ENCOUNTER — Ambulatory Visit (INDEPENDENT_AMBULATORY_CARE_PROVIDER_SITE_OTHER): Payer: Medicare Other | Admitting: *Deleted

## 2010-12-01 DIAGNOSIS — Z7901 Long term (current) use of anticoagulants: Secondary | ICD-10-CM

## 2010-12-01 DIAGNOSIS — I4891 Unspecified atrial fibrillation: Secondary | ICD-10-CM

## 2010-12-01 MED ORDER — WARFARIN SODIUM 5 MG PO TABS: 5.0000 mg | ORAL_TABLET | ORAL | Status: DC

## 2010-12-13 ENCOUNTER — Telehealth: Payer: Self-pay | Admitting: Oncology

## 2010-12-13 ENCOUNTER — Other Ambulatory Visit: Payer: Self-pay | Admitting: Oncology

## 2010-12-13 DIAGNOSIS — C8590 Non-Hodgkin lymphoma, unspecified, unspecified site: Secondary | ICD-10-CM

## 2010-12-13 NOTE — Telephone Encounter (Signed)
per pof 07/02 called pts home lmovm with appts for jan2013. scheduled pt for ct and pet scan on 01/24/2011 @ 9:30am @ [facility].

## 2011-01-12 ENCOUNTER — Encounter: Payer: Medicare Other | Admitting: *Deleted

## 2011-01-24 ENCOUNTER — Other Ambulatory Visit: Payer: Medicare Other | Admitting: Lab

## 2011-01-24 ENCOUNTER — Encounter (HOSPITAL_COMMUNITY)
Admission: RE | Admit: 2011-01-24 | Discharge: 2011-01-24 | Disposition: A | Discharge status: Home / Self Care | Payer: Medicare Other | Source: Ambulatory Visit | Attending: Oncology | Admitting: Oncology

## 2011-01-24 ENCOUNTER — Encounter (HOSPITAL_COMMUNITY): Payer: Self-pay

## 2011-01-24 DIAGNOSIS — C8590 Non-Hodgkin lymphoma, unspecified, unspecified site: Secondary | ICD-10-CM

## 2011-01-24 DIAGNOSIS — I2510 Atherosclerotic heart disease of native coronary artery without angina pectoris: Secondary | ICD-10-CM | POA: Insufficient documentation

## 2011-01-24 DIAGNOSIS — K5730 Diverticulosis of large intestine without perforation or abscess without bleeding: Secondary | ICD-10-CM | POA: Insufficient documentation

## 2011-01-24 DIAGNOSIS — Z9221 Personal history of antineoplastic chemotherapy: Secondary | ICD-10-CM | POA: Insufficient documentation

## 2011-01-24 DIAGNOSIS — J479 Bronchiectasis, uncomplicated: Secondary | ICD-10-CM | POA: Insufficient documentation

## 2011-01-24 DIAGNOSIS — J8410 Pulmonary fibrosis, unspecified: Secondary | ICD-10-CM | POA: Insufficient documentation

## 2011-01-24 DIAGNOSIS — Z923 Personal history of irradiation: Secondary | ICD-10-CM | POA: Insufficient documentation

## 2011-01-24 DIAGNOSIS — I359 Nonrheumatic aortic valve disorder, unspecified: Secondary | ICD-10-CM | POA: Insufficient documentation

## 2011-01-24 DIAGNOSIS — J984 Other disorders of lung: Secondary | ICD-10-CM | POA: Insufficient documentation

## 2011-01-24 DIAGNOSIS — I723 Aneurysm of iliac artery: Secondary | ICD-10-CM | POA: Insufficient documentation

## 2011-01-24 DIAGNOSIS — N289 Disorder of kidney and ureter, unspecified: Secondary | ICD-10-CM | POA: Insufficient documentation

## 2011-01-24 DIAGNOSIS — C8589 Other specified types of non-Hodgkin lymphoma, extranodal and solid organ sites: Secondary | ICD-10-CM | POA: Insufficient documentation

## 2011-01-24 LAB — CBC WITH DIFFERENTIAL/PLATELET
BASO%: 0.4 % (ref 0.0–2.0)
Basophils Absolute: 0 10*3/uL (ref 0.0–0.1)
EOS%: 1.8 % (ref 0.0–7.0)
Eosinophils Absolute: 0.1 10*3/uL (ref 0.0–0.5)
HCT: 44.2 % (ref 38.4–49.9)
HGB: 14.7 g/dL (ref 13.0–17.1)
LYMPH%: 18.3 % (ref 14.0–49.0)
MCH: 28.8 pg (ref 27.2–33.4)
MCHC: 33.1 g/dL (ref 32.0–36.0)
MCV: 87 fL (ref 79.3–98.0)
MONO#: 0.5 10*3/uL (ref 0.1–0.9)
MONO%: 7.8 % (ref 0.0–14.0)
NEUT#: 4.9 10*3/uL (ref 1.5–6.5)
NEUT%: 71.7 % (ref 39.0–75.0)
Platelets: 236 10*3/uL (ref 140–400)
RBC: 5.09 10*6/uL (ref 4.20–5.82)
RDW: 15.8 % — ABNORMAL HIGH (ref 11.0–14.6)
WBC: 6.8 10*3/uL (ref 4.0–10.3)
lymph#: 1.2 10*3/uL (ref 0.9–3.3)

## 2011-01-24 LAB — CMP (CANCER CENTER ONLY)
Albumin: 3.3 g/dL (ref 3.3–5.5)
Alkaline Phosphatase: 74 U/L (ref 26–84)
BUN, Bld: 19 mg/dL (ref 7–22)
Calcium: 9.5 mg/dL (ref 8.0–10.3)
Creat: 1.4 mg/dl — ABNORMAL HIGH (ref 0.6–1.2)
Glucose, Bld: 168 mg/dL — ABNORMAL HIGH (ref 73–118)
Potassium: 4.2 mEq/L (ref 3.3–4.7)

## 2011-01-24 LAB — GLUCOSE, CAPILLARY: Glucose-Capillary: 140 mg/dL — ABNORMAL HIGH (ref 70–99)

## 2011-01-24 MED ORDER — IOHEXOL 300 MG/ML  SOLN
100.0000 mL | Freq: Once | INTRAMUSCULAR | Status: AC | PRN
  Administered 2011-01-24: 100 mL via INTRAVENOUS

## 2011-01-24 MED ORDER — FLUDEOXYGLUCOSE F - 18 (FDG) INJECTION
18.9000 | Freq: Once | INTRAVENOUS | Status: AC | PRN
  Administered 2011-01-24: 18.9 via INTRAVENOUS

## 2011-01-31 ENCOUNTER — Ambulatory Visit: Payer: Medicare Other | Admitting: Oncology

## 2011-02-07 ENCOUNTER — Ambulatory Visit (INDEPENDENT_AMBULATORY_CARE_PROVIDER_SITE_OTHER): Payer: Medicare Other

## 2011-02-07 ENCOUNTER — Encounter: Payer: Self-pay | Admitting: Internal Medicine

## 2011-02-07 ENCOUNTER — Ambulatory Visit (INDEPENDENT_AMBULATORY_CARE_PROVIDER_SITE_OTHER): Payer: Medicare Other | Admitting: Internal Medicine

## 2011-02-07 DIAGNOSIS — I4891 Unspecified atrial fibrillation: Secondary | ICD-10-CM

## 2011-02-07 DIAGNOSIS — I509 Heart failure, unspecified: Secondary | ICD-10-CM

## 2011-02-07 DIAGNOSIS — I5032 Chronic diastolic (congestive) heart failure: Secondary | ICD-10-CM

## 2011-02-07 DIAGNOSIS — Z7901 Long term (current) use of anticoagulants: Secondary | ICD-10-CM

## 2011-02-07 LAB — POCT INR: INR: 2.8

## 2011-02-07 NOTE — Assessment & Plan Note (Signed)
Appears euvolemic 2 gram sodium restriction advised  Recent BMET 1/13 reveals stable creatinine No changes today  Return in 6 months

## 2011-02-07 NOTE — Progress Notes (Signed)
The patient presents today for routine electrophysiology followup.  Since last being seen in our clinic, the patient reports doing very well.   He remains active.  He is unaware of symptoms with afib.  His SOB is better and his weight has returned to baseline.  He is concerned with worsening hearing and feels that his ears need to be "cleaned out".  Today, he denies symptoms of palpitations, chest pain, orthopnea, PND, lower extremity edema, dizziness, presyncope, syncope, or neurologic sequela.  The patient feels that he is tolerating medications without difficulties and is otherwise without complaint today.   Past Medical History  Diagnosis Date  . Diabetes mellitus   . Non Hodgkin's lymphoma     B cell, high-grade  . Atrial fibrillation     Permanent, Coumadin therapy  . HTN (hypertension)   . Chronic diastolic heart failure   . Anemia   . Dementia   . DM2 (diabetes mellitus, type 2)   . Cancer    No past surgical history on file.  Current Outpatient Prescriptions  Medication Sig Dispense Refill  . Cyanocobalamin (VITAMIN B-12 CR PO) Take by mouth daily.        . digoxin (LANOXIN) 0.125 MG tablet Take 1 tablet (125 mcg total) by mouth daily.  30 tablet  11  . ferrous sulfate (MEIJER FERROUS SULFATE) 325 (65 FE) MG tablet Take 325 mg by mouth daily with breakfast.       . furosemide (LASIX) 40 MG tablet Take 2 tablets every morning  180 tablet  3  . metFORMIN (GLUCOPHAGE-XR) 500 MG 24 hr tablet 3 TIMES DAILY      . Multiple Vitamin (MULTIVITAMIN) tablet Take 1 tablet by mouth daily.        Marland Kitchen warfarin (COUMADIN) 5 MG tablet Take 1 tablet (5 mg total) by mouth as directed.  45 tablet  3    No Known Allergies  History   Social History  . Marital Status: Married    Spouse Name: N/A    Number of Children: N/A  . Years of Education: N/A   Occupational History  . Not on file.   Social History Main Topics  . Smoking status: Former Smoker    Types: Cigarettes    Quit date:  07/28/1970  . Smokeless tobacco: Not on file  . Alcohol Use: No  . Drug Use: No  . Sexually Active: Not on file   Other Topics Concern  . Not on file   Social History Narrative   Married, retired from [employer], gets regular exercise.     Family History  Problem Relation Age of Onset  . Heart attack Brother     and sister  . Diabetes Other     famiyl hx   Physical Exam: Filed Vitals:   02/07/11 1010  BP: 132/80  Pulse: 89  Resp: 20  Height: 5\' 9"  (1.753 m)  Weight: 201 lb 12.8 oz (91.536 kg)    GEN- The patient is well appearing, alert and oriented x 3 today.   Head- normocephalic, atraumatic Eyes-  Sclera clear, conjunctiva pink Ears- hearing grossly decreased Oropharynx- clear Neck- supple, JVP 9cm Lymph- no cervical lymphadenopathy Lungs- CTAB , normal work of breathing Heart- irregular rate and rhythm, no murmurs, rubs or gallops, PMI not laterally displaced GI- soft, NT, ND, + BS Extremities- no clubbing, cyanosis, 2+ edema MS- no significant deformity or atrophy Skin- no rash or lesion Psych- euthymic mood, full affect Neuro- strength and sensation are intact  Assessment and Plan:

## 2011-02-07 NOTE — Patient Instructions (Signed)
Your physician wants you to follow-up in: 6 months with Dr. Johney Frame. You will receive a reminder letter in the mail two months in advance. If you don't receive a letter, please call our office to schedule the follow-up appointment.  [facility]  You will have your coumadin checked today.  2 Gram Low Sodium Diet A 2 gram sodium diet restricts the amount of sodium in the diet to no more than 2 g or 2000 mg daily. Limiting the amount of sodium is often used to help lower blood pressure. It is important if you have heart, liver, or kidney problems. Many foods contain sodium for flavor and sometimes as a preservative. When the amount of sodium in a diet needs to be low, it is important to know what to look for when choosing foods and drinks. The following includes some information and guidelines to help make it easier for you to adapt to a low sodium diet. QUICK TIPS  Do not add salt to food.   Avoid convenience items and fast food.   Choose unsalted snack foods.   Buy lower sodium products, often labeled as "lower sodium" or "no salt added."   Check food labels to learn how much sodium is in 1 serving.   When eating at a restaurant, ask that your food be prepared with less salt or none, if possible.  READING FOOD LABELS FOR SODIUM INFORMATION The nutrition facts label is a good place to find how much sodium is in foods. Look for products with no more than 500 to 600 mg of sodium per meal and no more than 150 mg per serving. Remember that 2 g = 2000 mg. The food label may also list foods as:  Sodium-free: Less than 5 mg in a serving.   Very low sodium: 35 mg or less in a serving.   Low-sodium: 140 mg or less in a serving.   Light in sodium: 50% less sodium in a serving. For example, if a food that usually has 300 mg of sodium is changed to become light in sodium, it will have 150 mg of sodium.   Reduced sodium: 25% less sodium in a serving. For  example, if a food that usually has 400 mg of sodium is changed to reduced sodium, it will have 300 mg of sodium.  CHOOSING FOODS Grains  Avoid: Salted crackers and snack items. Some cereals, including instant hot cereals. Bread stuffing and biscuit mixes. Seasoned rice or pasta mixes.   Choose: Unsalted snack items. Low-sodium cereals, oats, puffed wheat and rice, shredded wheat. English muffins and bread. Pasta.  Meats  Avoid: Salted, canned, smoked, spiced, pickled meats, including fish and poultry. Bacon, ham, sausage, cold cuts, hot dogs, anchovies.   Choose: Low-sodium canned tuna and salmon. Fresh or frozen meat, poultry, and fish.  Dairy  Avoid: Processed cheese and spreads. Cottage cheese. Buttermilk and condensed milk. Regular cheese.   Choose: Milk. Low-sodium cottage cheese. Yogurt. Sour cream. Low-sodium cheese.  Fruits and Vegetables  Avoid: Regular canned vegetables. Regular canned tomato sauce and paste. Frozen vegetables in sauces. Olives. Rosita Fire. Relishes. Sauerkraut.   Choose: Low-sodium canned vegetables. Low-sodium tomato sauce and paste. Frozen or fresh vegetables. Fresh and frozen fruit.  Condiments  Avoid: Canned and packaged gravies. Worcestershire sauce. Tartar sauce. Barbecue sauce. Soy sauce. Steak sauce. Ketchup. Onion, garlic, and table salt. Meat flavorings and tenderizers.   Choose: Fresh and dried herbs and spices. Low-sodium varieties of mustard  and ketchup. Lemon juice. Tabasco sauce. Horseradish.  SAMPLE 2 GRAM SODIUM MEAL PLAN Breakfast / Sodium (mg)  1 cup low-fat milk / 143 mg   2 slices whole-wheat toast / 270 mg   1 tbs heart-healthy margarine / 153 mg   1 hard-boiled egg / 139 mg   1 small orange / 0 mg  Lunch / Sodium (mg)  1 cup raw carrots / 76 mg    cup hummus / 298 mg   1 cup low-fat milk / 143 mg    cup red grapes / 2 mg   1 whole-wheat pita bread / 356 mg  Dinner / Sodium (mg)  1 cup whole-wheat pasta / 2 mg   1  cup low-sodium tomato sauce / 73 mg   3 oz lean ground beef / 57 mg   1 small side salad (1 cup raw spinach leaves,  cup cucumber,  cup yellow bell pepper) with 1 tsp olive oil and 1 tsp red wine vinegar / 25 mg  Snack / Sodium (mg)  1 container low-fat vanilla yogurt / 107 mg   3 graham cracker squares / 127 mg  Nutrient Analysis  Calories: 2033   Protein: 77 g   Carbohydrate: 282 g   Fat: 72 g   Sodium: 1971 mg  Document Released: 12/20/2004 Document Revised: 09/01/2010 Document Reviewed: 03/23/2009 [facility] Patient Information 2012 [place], [place].

## 2011-02-07 NOTE — Assessment & Plan Note (Signed)
Anticoagulated and rate controlled permanent afib He has not seen our coumadin clinic recently.  He will have his INR checked today

## 2011-02-28 ENCOUNTER — Other Ambulatory Visit: Payer: Self-pay | Admitting: Internal Medicine

## 2011-03-04 ENCOUNTER — Other Ambulatory Visit: Payer: Self-pay | Admitting: Internal Medicine

## 2011-03-16 ENCOUNTER — Encounter: Payer: Self-pay | Admitting: Oncology

## 2011-03-16 ENCOUNTER — Telehealth: Payer: Self-pay | Admitting: *Deleted

## 2011-03-16 ENCOUNTER — Ambulatory Visit (HOSPITAL_BASED_OUTPATIENT_CLINIC_OR_DEPARTMENT_OTHER): Payer: Medicare Other | Admitting: Oncology

## 2011-03-16 VITALS — BP 159/78 | HR 105 | Temp 98.4°F | Ht 69.0 in | Wt 201.2 lb

## 2011-03-16 DIAGNOSIS — C8589 Other specified types of non-Hodgkin lymphoma, extranodal and solid organ sites: Secondary | ICD-10-CM

## 2011-03-16 NOTE — Telephone Encounter (Signed)
called ir department scheduled patient port a cath to be removed on 03-22-2011 at 7:30am gave patient for 09-2011 printed out calendar and gave to the patient

## 2011-03-16 NOTE — Patient Instructions (Signed)
1. You are doing well. Your CT scans showed no cancer.  2. I have scheduled you to get your port removed. IR will cal you   3. You will see me back in 6 months which will be august.

## 2011-03-16 NOTE — Progress Notes (Signed)
OFFICE PROGRESS NOTE  CC  DOOLITTLE, Harrel Lemon, MD, MD [address] [place] [address] Dr. Hillis Range Thomasenia Bottoms PA  DIAGNOSIS: 78 year old male high grade non Hodgkin Lymphoma diagnosed in June 2010  PRIOR THERAPY:  1. Radiation to the thoracic spine and adjacent areas of involvement from 06/17/08 - 07/15/08  2. CHOP_R x 6 cycles 08/07/08 - 11/12/08  CURRENT THERAPY:observation  INTERVAL HISTORY: Jesse Lambert 78 y.o. male returns for follow up visit. Overall he is doing well. He is receiving coumadin because of an irregular heartbeat. He has not any fevers or chills or night sweats, no pain in the back , no difficulty walking. Bottom of his feet is still numb, it comes and goes. He has had a little bit of loss of hearing.No bleeding or bruising.Remainder of the 10 point  review of systems is negative.  MEDICAL HISTORY: Past Medical History  Diagnosis Date  . Diabetes mellitus   . Non Hodgkin's lymphoma     B cell, high-grade  . Atrial fibrillation     Permanent, Coumadin therapy  . HTN (hypertension)   . Chronic diastolic heart failure   . Anemia   . Dementia   . DM2 (diabetes mellitus, type 2)   . Cancer     ALLERGIES:   has no known allergies.  MEDICATIONS:  Current Outpatient Prescriptions  Medication Sig Dispense Refill  . Cyanocobalamin (VITAMIN B-12 CR PO) Take by mouth daily.        . digoxin (LANOXIN) 0.125 MG tablet Take 1 tablet (125 mcg total) by mouth daily.  30 tablet  11  . ferrous sulfate (MEIJER FERROUS SULFATE) 325 (65 FE) MG tablet Take 325 mg by mouth daily with breakfast.       . furosemide (LASIX) 40 MG tablet Take 2 tablets every morning  180 tablet  3  . metFORMIN (GLUCOPHAGE-XR) 500 MG 24 hr tablet TAKE 2 TABLETS BY MOUTH EVERY MORNING AND 1 EVERY EVENING  90 tablet  0  . Multiple Vitamin (MULTIVITAMIN) tablet Take 1 tablet by mouth daily.        Marland Kitchen warfarin (COUMADIN) 5 MG tablet Take 1 tablet (5 mg total) by mouth as directed.   45 tablet  3    SURGICAL HISTORY: No past surgical history on file.  REVIEW OF SYSTEMS:  Pertinent items are noted in HPI.   PHYSICAL EXAMINATION: Head: Normocephalic, without obvious abnormality, atraumatic Neck: no adenopathy, no carotid bruit, no JVD, supple, symmetrical, trachea midline and thyroid not enlarged, symmetric, no tenderness/mass/nodules Lymph nodes: Cervical, supraclavicular, and axillary nodes normal. Resp: clear to auscultation bilaterally and normal percussion bilaterally Back: symmetric, no curvature. ROM normal. No CVA tenderness. Cardio: irregularly irregular rhythm GI: soft, non-tender; bowel sounds normal; no masses,  no organomegaly Extremities: extremities normal, atraumatic, no cyanosis or edema Neurologic: Alert and oriented X 3, normal strength and tone. Normal symmetric reflexes. Normal coordination and gait  ECOG PERFORMANCE STATUS: 1 - Symptomatic but completely ambulatory  Blood pressure 159/78, pulse 105, temperature 98.4 F (36.9 C), temperature source Oral, height 5\' 9"  (1.753 m), weight 201 lb 3.2 oz (91.264 kg).  LABORATORY DATA: Lab Results  Component Value Date   WBC 6.8 01/24/2011   HGB 14.7 01/24/2011   HCT 44.2 01/24/2011   MCV 87.0 01/24/2011   PLT 236 01/24/2011      Chemistry      Component Value Date/Time   NA 147* 01/24/2011 0830   NA 144 09/28/2010 1037  K 4.2 01/24/2011 0830   K 3.6 09/28/2010 1037   CL 105 01/24/2011 0830   CL 108 09/28/2010 1037   CO2 30 01/24/2011 0830   CO2 27 09/28/2010 1037   BUN 19 01/24/2011 0830   BUN 15 09/28/2010 1037   CREATININE 1.4* 01/24/2011 0830   CREATININE 1.4 09/28/2010 1037      Component Value Date/Time   CALCIUM 9.5 01/24/2011 0830   CALCIUM 9.3 09/28/2010 1037   ALKPHOS 74 01/24/2011 0830   ALKPHOS 67 05/18/2010 1436   ALKPHOS 67 05/18/2010 1436   AST 22 01/24/2011 0830   AST 17 05/18/2010 1436   AST 17 05/18/2010 1436   ALT 13 05/18/2010 1436   ALT 13 05/18/2010 1436   BILITOT 1.00 01/24/2011  0830   BILITOT 0.4 05/18/2010 1436   BILITOT 0.4 05/18/2010 1436       RADIOGRAPHIC STUDIES:  RADIOLOGY REPORT*  Clinical Data: Non-Hodgkins lymphoma diagnosed in May 2010 status  post chemotherapy and radiation therapy now complete. No current  chest abdomen or pelvic complaints.  CT CHEST WITH CONTRAST,CT ABDOMEN AND PELVIS WITH CONTRAST  Technique: Multidetector CT imaging of the chest was performed  following the standard protocol during bolus administration of  intravenous contrast.,Technique: Multidetector CT imaging of the  abdomen and pelvis was performed following the standard protoc  Contrast: OMNIPAQUE IOHEXOL 300 MG/ML IV SOLN  Comparison: Multiple prior studies, most recently CT of chest  abdomen and pelvis 06/09/2010.  Findings:  Mediastinum: Heart size is mildly enlarged. No pericardial fluid,  thickening or calcification. No acute abnormality of the thoracic  aorta or other great vessels of the mediastinum. There is  atherosclerosis of the thoracic aorta, the great vessels of the  mediastinum and the coronary arteries, including calcified  atherosclerotic plaque in the left main, LAD, left circumflex and  right coronary arteries. Calcifications of the aortic valve. No  pathologically enlarged mediastinal or hilar lymph nodes. The  esophagus is normal in appearance. Right internal jugular single  lumen Port-A-Cath with tip terminating at the superior cavoatrial  junction.  Lungs/Pleura: Previously noted nodular and masslike opacities have  essentially completely resolved compared to the prior examination.  There is a new area of ground-glass attenuation surrounded by a  thin rim of soft tissue in the lateral aspect of the right upper  lobe that has an appearance suggestive of an "atoll sign", which  could suggest a resolving region of organizing pneumonia (this may  alternatively simply reflect post infectious scarring). Extensive  chronic volume loss,  thickening of the peribronchovascular  interstitium, architectural distortion and traction bronchiectasis  is again noted in the basal segments of the right lower lobe, and  in the medial basal segment left lower lobe, consistent with  fibrosis. No acute airspace consolidation. No pleural effusions.  Abdomen/Pelvis: Infused appearance of the liver, gallbladder,  pancreas, spleen and bilateral adrenal glands is unremarkable. Two  small soft tissue attenuation nodules with similar characteristics  to that of the spleen are again noted at the splenic hilum,  consistent with small splenules. Multiple bilateral low  attenuation renal lesions are similar to prior studies, largest of  which measures 3 cm in diameter in the medial aspect of the  interpolar region of the right kidney, all consistent with cysts.  No ascites or pneumoperitoneum. No pathologic distension of bowel.  A few colonic diverticula are noted, without surrounding  inflammatory changes to suggest acute diverticulitis at this time.  Normal appendix. 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 No aneurysm or dissection noted.  Musculoskeletal: No aggressive appearing lytic or blastic lesions  are noted in the visualized portions of the skeleton.  IMPRESSION:  1. No findings to suggest recurrent disease in the chest, abdomen  or pelvis.  2. Previously noted nodular and mass-like opacities have  essentially completely resolved in the lungs bilaterally. There is  a new area in the right lung peripherally that it appears  suspicious for resolving region of organizing pneumonia. This may  alternatively represent an area of post infectious scarring.  3. Fibrotic changes in the basal segments of the right lower lobe  and in the medial basal segment left lower lobe again  noted.  4. Atherosclerosis, including left main and three-vessel coronary  artery disease. Please note that although the presence of coronary  artery calcium documents the presence of coronary artery disease,  the severity of this disease and any potential stenosis cannot be  assessed on this non-gated CT examination. Assessment for  potential risk factor modification, dietary therapy or  pharmacologic therapy may be warranted, if clinically indicated.  5. There is diverticulosis of the colon, without surrounding  inflammatory changes at this time to suggest acute diverticulitis.  6. There are calcifications of the aortic valve. Echocardiographic  correlation for evaluation of potential valvular dysfunction may be  warranted if clinically indicated.    ASSESSMENT: 78 year old male with :  1. Non Hodgkin Lymphoma near the thoracic spine patient recived radiation followed by chemotherapy CHOP-R NED  2. Restaging scans no evidence of recurrent disease   PLAN:   1. Follow up in 6 months with labs  2. Port a cath removal   All questions were answered. The patient knows to call the clinic with any problems, questions or concerns. We can certainly see the patient much sooner if necessary.  I spent 25 minutes counseling the patient face to face. The total time spent in the appointment was 30 minutes.    Drue Second, MD Medical/Oncology [facility] [phone] (beeper) [phone] (Office)  03/16/2011, 11:12 AM

## 2011-03-17 ENCOUNTER — Other Ambulatory Visit (HOSPITAL_COMMUNITY): Payer: Self-pay | Admitting: Diagnostic Radiology

## 2011-03-17 ENCOUNTER — Telehealth: Payer: Self-pay | Admitting: Internal Medicine

## 2011-03-17 NOTE — Telephone Encounter (Signed)
New problem:  Per Inetta Fermo, patient  need to come off coumadin today . Due to upcoming - porta cath removal .

## 2011-03-17 NOTE — Telephone Encounter (Signed)
Ok for patient to hold coumadin 4 days prior but needs to resume right after per phone conversation with Dr Johney Frame.  Advised Inetta Fermo

## 2011-03-17 NOTE — Telephone Encounter (Signed)
Called and left message on Dr Allred's cell and will forward to him as well

## 2011-03-22 ENCOUNTER — Other Ambulatory Visit: Payer: Self-pay | Admitting: Radiology

## 2011-03-22 ENCOUNTER — Telehealth (HOSPITAL_COMMUNITY): Payer: Self-pay | Admitting: Oncology

## 2011-03-22 ENCOUNTER — Other Ambulatory Visit (HOSPITAL_COMMUNITY): Payer: Medicare Other

## 2011-03-23 ENCOUNTER — Ambulatory Visit (HOSPITAL_COMMUNITY)
Admission: RE | Admit: 2011-03-23 | Discharge: 2011-03-23 | Disposition: A | Discharge status: Home / Self Care | Payer: Medicare Other | Source: Ambulatory Visit | Attending: Oncology | Admitting: Oncology

## 2011-03-23 ENCOUNTER — Encounter (HOSPITAL_COMMUNITY): Payer: Self-pay

## 2011-03-23 DIAGNOSIS — I509 Heart failure, unspecified: Secondary | ICD-10-CM | POA: Insufficient documentation

## 2011-03-23 DIAGNOSIS — I4891 Unspecified atrial fibrillation: Secondary | ICD-10-CM | POA: Insufficient documentation

## 2011-03-23 DIAGNOSIS — F0390 Unspecified dementia without behavioral disturbance: Secondary | ICD-10-CM | POA: Insufficient documentation

## 2011-03-23 DIAGNOSIS — Z7901 Long term (current) use of anticoagulants: Secondary | ICD-10-CM | POA: Insufficient documentation

## 2011-03-23 DIAGNOSIS — C8589 Other specified types of non-Hodgkin lymphoma, extranodal and solid organ sites: Secondary | ICD-10-CM | POA: Insufficient documentation

## 2011-03-23 DIAGNOSIS — I10 Essential (primary) hypertension: Secondary | ICD-10-CM | POA: Insufficient documentation

## 2011-03-23 DIAGNOSIS — Z79899 Other long term (current) drug therapy: Secondary | ICD-10-CM | POA: Insufficient documentation

## 2011-03-23 DIAGNOSIS — I5032 Chronic diastolic (congestive) heart failure: Secondary | ICD-10-CM | POA: Insufficient documentation

## 2011-03-23 DIAGNOSIS — Z452 Encounter for adjustment and management of vascular access device: Secondary | ICD-10-CM | POA: Insufficient documentation

## 2011-03-23 DIAGNOSIS — E119 Type 2 diabetes mellitus without complications: Secondary | ICD-10-CM | POA: Insufficient documentation

## 2011-03-23 LAB — BASIC METABOLIC PANEL
BUN: 15 mg/dL (ref 6–23)
Chloride: 104 mEq/L (ref 96–112)
Creatinine, Ser: 1.3 mg/dL (ref 0.50–1.35)
GFR calc Af Amer: 59 mL/min — ABNORMAL LOW (ref >90)

## 2011-03-23 LAB — CBC
HCT: 41.9 % (ref 39.0–52.0)
MCHC: 32.7 g/dL (ref 30.0–36.0)
RDW: 15.3 % (ref 11.5–15.5)

## 2011-03-23 LAB — PROTIME-INR: INR: 1.31 (ref 0.00–1.49)

## 2011-03-23 MED ORDER — MIDAZOLAM HCL 2 MG/2ML IJ SOLN
INTRAMUSCULAR | Status: AC
  Filled 2011-03-23: qty 6

## 2011-03-23 MED ORDER — SODIUM CHLORIDE 0.9 % IV SOLN
Freq: Once | INTRAVENOUS | Status: AC
  Administered 2011-03-23: 14:00:00 via INTRAVENOUS

## 2011-03-23 MED ORDER — CEFAZOLIN SODIUM-DEXTROSE 2-3 GM-% IV SOLR
2.0000 g | Freq: Once | INTRAVENOUS | Status: AC
  Administered 2011-03-23: 2 g via INTRAVENOUS

## 2011-03-23 MED ORDER — FENTANYL CITRATE 0.05 MG/ML IJ SOLN
INTRAMUSCULAR | Status: AC
  Filled 2011-03-23: qty 6

## 2011-03-23 MED ORDER — MIDAZOLAM HCL 5 MG/5ML IJ SOLN
INTRAMUSCULAR | Status: DC | PRN
  Administered 2011-03-23: 1 mg via INTRAVENOUS

## 2011-03-23 MED ORDER — FENTANYL CITRATE 0.05 MG/ML IJ SOLN
INTRAMUSCULAR | Status: DC | PRN
  Administered 2011-03-23: 100 ug via INTRAVENOUS

## 2011-03-23 MED ORDER — CEFAZOLIN SODIUM-DEXTROSE 2-3 GM-% IV SOLR
INTRAVENOUS | Status: AC
  Filled 2011-03-23: qty 50

## 2011-03-23 NOTE — H&P (Signed)
Jesse Lambert is an 78 y.o. male.   Chief Complaint: Hx of Lymphoma HPI: Pt with hx of Non-Hodgkins lymphoma who has completed his treatment and no longer needs his portacath for access. Reffered to IR for removal. No other c/o or issues. Is normally on Coumadin but has held past 4 days.  Past Medical History  Diagnosis Date  . Diabetes mellitus   . Non Hodgkin's lymphoma     B cell, high-grade  . Atrial fibrillation     Permanent, Coumadin therapy  . HTN (hypertension)   . Chronic diastolic heart failure   . Anemia   . Dementia   . DM2 (diabetes mellitus, type 2)   . Cancer     No past surgical history on file.  Family History  Problem Relation Age of Onset  . Heart attack Brother     and sister  . Diabetes Other     famiyl hx   Social History:  reports that he quit smoking about 40 years ago. His smoking use included Cigarettes. He does not have any smokeless tobacco history on file. He reports that he does not drink alcohol or use illicit drugs.  Allergies: No Known Allergies  Medications Prior to Admission  Medication Sig Dispense Refill  . Cyanocobalamin (VITAMIN B-12 CR PO) Take by mouth daily.        . digoxin (LANOXIN) 0.125 MG tablet Take 1 tablet (125 mcg total) by mouth daily.  30 tablet  11  . ferrous sulfate (MEIJER FERROUS SULFATE) 325 (65 FE) MG tablet Take 325 mg by mouth daily with breakfast.       . furosemide (LASIX) 40 MG tablet Take 2 tablets every morning  180 tablet  3  . metFORMIN (GLUCOPHAGE-XR) 500 MG 24 hr tablet TAKE 2 TABLETS BY MOUTH EVERY MORNING AND 1 EVERY EVENING  90 tablet  0  . Multiple Vitamin (MULTIVITAMIN) tablet Take 1 tablet by mouth daily.        Marland Kitchen warfarin (COUMADIN) 5 MG tablet Take 1 tablet (5 mg total) by mouth as directed.  45 tablet  3   Medications Prior to Admission  Medication Dose Route Frequency Provider Last Rate Last Dose  . 0.9 %  sodium chloride infusion   Intravenous Once Brayton El, PA      . ceFAZolin  (ANCEF) 2-3 GM-% IVPB SOLR           . ceFAZolin (ANCEF) IVPB 2 g/50 mL premix  2 g Intravenous Once Brayton El, PA        Results for orders placed during the hospital encounter of 03/23/11 (from the past 48 hour(s))  APTT     Status: Abnormal   Collection Time   03/23/11  1:25 PM      Component Value Range Comment   aPTT 45 (*) 24 - 37 (seconds)   CBC     Status: Normal   Collection Time   03/23/11  1:25 PM      Component Value Range Comment   WBC 6.7  4.0 - 10.5 (K/uL)    RBC 4.76  4.22 - 5.81 (MIL/uL)    Hemoglobin 13.7  13.0 - 17.0 (g/dL)    HCT 16.1  09.6 - 04.5 (%)    MCV 88.0  78.0 - 100.0 (fL)    MCH 28.8  26.0 - 34.0 (pg)    MCHC 32.7  30.0 - 36.0 (g/dL)    RDW 40.9  81.1 - 91.4 (%)  Platelets 203  150 - 400 (K/uL)   PROTIME-INR     Status: Abnormal   Collection Time   03/23/11  1:25 PM      Component Value Range Comment   Prothrombin Time 16.5 (*) 11.6 - 15.2 (seconds)    INR 1.31  0.00 - 1.49     No results found.  Review of Systems  Constitutional: Negative for fever and chills.  Eyes: Negative for blurred vision.  Respiratory: Negative for cough and shortness of breath.   Cardiovascular: Negative for chest pain and palpitations.  Gastrointestinal: Negative for heartburn, nausea and vomiting.  Genitourinary: Negative for dysuria.  Musculoskeletal: Negative for myalgias.  Neurological: Negative for dizziness and headaches.    Blood pressure 145/94, pulse 95, temperature 98.8 F (37.1 C), temperature source Oral, resp. rate 14, height 5\' 9"  (1.753 m), weight 201 lb (91.173 kg), SpO2 97.00%. Physical Exam  Constitutional: He appears well-developed and well-nourished. No distress.  HENT:  Head: Normocephalic and atraumatic.  Neck: Normal range of motion.  Cardiovascular: Normal rate and regular rhythm.   No murmur heard. Respiratory: Effort normal and breath sounds normal. He has no wheezes.       Palpable port hub (R)upper chest wall  GI: Soft. Bowel  sounds are normal. He exhibits no distension.     Assessment/Plan Hx Lymphoma Needs port removal INR sub-therapeutic After consideration of risks, benefits and other options for treatment, the patient has consented to the above procedure.   Brayton El PA-C 03/23/2011, 2:21 PM

## 2011-03-23 NOTE — Procedures (Signed)
Successful removal of right anterior chest wall port-a-cath. No immediate post procedural complications.  

## 2011-03-23 NOTE — Discharge Instructions (Signed)
Moderate Sedation, Adult Moderate sedation is given to help you relax or even sleep through a procedure. You may remain sleepy, be clumsy, or have poor balance for several hours following this procedure. Arrange for a responsible adult, family member, or friend to take you home. A responsible adult should stay with you for at least 24 hours or until the medicines have worn off.  Do not participate in any activities where you could become injured for the next 24 hours, or until you feel normal again. Do not:   Drive.   Swim.   Ride a bicycle.   Operate heavy machinery.   Cook.   Use power tools.   Climb ladders.   Work at heights.   Do not make important decisions or sign legal documents until you are improved.   Vomiting may occur if you eat too soon. When you can drink without vomiting, try water, juice, or soup. Try solid foods if you feel little or no nausea.   Only take over-the-counter or prescription medications for pain, discomfort, or fever as directed by your caregiver.If pain medications have been prescribed for you, ask your caregiver how soon it is safe to take them.   Make sure you and your family fully understands everything about the medication given to you. Make sure you understand what side effects may occur.   You should not drink alcohol, take sleeping pills, or medications that cause drowsiness for at least 24 hours.   If you smoke, do not smoke alone.   If you are feeling better, you may resume normal activities 24 hours after receiving sedation.   Keep all appointments as scheduled. Follow all instructions.   Ask questions if you do not understand.  SEEK MEDICAL CARE IF:   Your skin is pale or bluish in color.   You continue to feel sick to your stomach (nauseous) or throw up (vomit).   Your pain is getting worse and not helped by medication.   You have bleeding or swelling.   You are still sleepy or feeling clumsy after 24 hours.  SEEK IMMEDIATE  MEDICAL CARE IF:   You develop a rash.   You have difficulty breathing.   You develop any type of allergic problem.   You have a fever.  Document Released: 09/14/2000 Document Revised: 12/09/2010 Document Reviewed: 02/05/2007 [employer] Patient Information 2012 [employer]. 

## 2011-04-13 ENCOUNTER — Telehealth: Payer: Self-pay

## 2011-04-13 NOTE — Telephone Encounter (Signed)
Pt is out of metformin - going out of town tomorrow for five days.  Needs OV  [phone]

## 2011-04-13 NOTE — Telephone Encounter (Signed)
Jesse Lambert & Jesse Lambert

## 2011-04-14 NOTE — Telephone Encounter (Signed)
LMOM to CB. Let pt know that we realize that he knows he is overdue for OV, may be able to get him a couple of weeks worth of Metformin until he can get in to see provider. Would we need to call it in OOT?

## 2011-04-16 NOTE — Telephone Encounter (Signed)
ADVISED PT WIFE THAT PT NEEDS OFFICE VISIT.  WIFE STATES HE WILL BE IN ON MONDAY

## 2011-04-18 ENCOUNTER — Ambulatory Visit (INDEPENDENT_AMBULATORY_CARE_PROVIDER_SITE_OTHER): Payer: Medicare Other | Admitting: Family Medicine

## 2011-04-18 VITALS — BP 148/94 | HR 80 | Temp 98.4°F | Resp 18 | Ht 68.5 in | Wt 200.0 lb

## 2011-04-18 DIAGNOSIS — I509 Heart failure, unspecified: Secondary | ICD-10-CM

## 2011-04-18 DIAGNOSIS — K089 Disorder of teeth and supporting structures, unspecified: Secondary | ICD-10-CM

## 2011-04-18 DIAGNOSIS — E119 Type 2 diabetes mellitus without complications: Secondary | ICD-10-CM

## 2011-04-18 DIAGNOSIS — I10 Essential (primary) hypertension: Secondary | ICD-10-CM

## 2011-04-18 DIAGNOSIS — K0889 Other specified disorders of teeth and supporting structures: Secondary | ICD-10-CM

## 2011-04-18 MED ORDER — METFORMIN HCL ER 500 MG PO TB24: ORAL_TABLET | ORAL | Status: DC

## 2011-04-18 MED ORDER — PENICILLIN V POTASSIUM 500 MG PO TABS: 500.0000 mg | ORAL_TABLET | Freq: Three times a day (TID) | ORAL | Status: AC

## 2011-04-18 NOTE — Progress Notes (Signed)
  Subjective:    Patient ID: Jesse Lambert, male    DOB: 09-10-1932, 78 y.o.   MRN: [id number]  HPI 78 yo male with multiple medical problems including Non-Hodgkins lymphoma, Afib (on coumadin, last INR subtherapeutic), CHF with chronic SOB, and diabetes here for refill on his diabetes medications.  HbA1C in June 7.2%.  On Metformin 1 in the AM and 2 at night (500 mg tabs).  Cr 1.30 in March - stable.   Also has toothache today.  Does not have a dentist.    Cardiologist is Dr. Johney Frame at [place].  Review of Systems Negative except as per HPI     Objective:   Physical Exam  Constitutional: He appears well-developed and well-nourished.       Hard of hearing  Short of breath with exertion but not at rest.   Cardiovascular: Normal rate, normal heart sounds and intact distal pulses.  An irregularly irregular rhythm present.  No murmur heard. Pulmonary/Chest: Effort normal and breath sounds normal.  Neurological: He is alert.  Skin: Skin is warm and dry.   Bottom, right incisor with redness, swelling, and tenderness at the gumline. Many missing teeth and caries.   Results for orders placed in visit on 04/18/11  GLUCOSE, POCT (MANUAL RESULT ENTRY)      Component Value Range   POC Glucose 110    POCT GLYCOSYLATED HEMOGLOBIN (HGB A1C)      Component Value Range   Hemoglobin A1C 6.8          Assessment & Plan:  DM - Good control.  Continue metformin 500 1 in the AM and 2 in the PM.   Dental pain - start PCN for early abscess  HTN, CHF - followed by cardiology.  Appears stable. May need to add antihypertensive if BPs stay up when pain of dental infection goes away.

## 2011-06-17 ENCOUNTER — Ambulatory Visit (INDEPENDENT_AMBULATORY_CARE_PROVIDER_SITE_OTHER): Payer: Medicare Other | Admitting: *Deleted

## 2011-06-17 DIAGNOSIS — Z7901 Long term (current) use of anticoagulants: Secondary | ICD-10-CM

## 2011-06-17 DIAGNOSIS — I4891 Unspecified atrial fibrillation: Secondary | ICD-10-CM

## 2011-06-17 MED ORDER — WARFARIN SODIUM 5 MG PO TABS: 5.0000 mg | ORAL_TABLET | ORAL | Status: DC

## 2011-06-24 ENCOUNTER — Ambulatory Visit (INDEPENDENT_AMBULATORY_CARE_PROVIDER_SITE_OTHER): Payer: Medicare Other | Admitting: Pharmacist

## 2011-06-24 DIAGNOSIS — I4891 Unspecified atrial fibrillation: Secondary | ICD-10-CM

## 2011-06-24 DIAGNOSIS — Z7901 Long term (current) use of anticoagulants: Secondary | ICD-10-CM

## 2011-06-24 LAB — POCT INR: INR: 1.3

## 2011-07-29 ENCOUNTER — Ambulatory Visit (INDEPENDENT_AMBULATORY_CARE_PROVIDER_SITE_OTHER): Payer: Medicare Other | Admitting: *Deleted

## 2011-07-29 DIAGNOSIS — I4891 Unspecified atrial fibrillation: Secondary | ICD-10-CM

## 2011-07-29 DIAGNOSIS — Z7901 Long term (current) use of anticoagulants: Secondary | ICD-10-CM

## 2011-07-29 LAB — POCT INR: INR: 1.5

## 2011-08-05 ENCOUNTER — Ambulatory Visit (INDEPENDENT_AMBULATORY_CARE_PROVIDER_SITE_OTHER): Payer: Medicare Other | Admitting: *Deleted

## 2011-08-05 DIAGNOSIS — I4891 Unspecified atrial fibrillation: Secondary | ICD-10-CM

## 2011-08-05 DIAGNOSIS — Z7901 Long term (current) use of anticoagulants: Secondary | ICD-10-CM

## 2011-08-05 LAB — POCT INR: INR: 3.3

## 2011-08-08 ENCOUNTER — Other Ambulatory Visit: Payer: Self-pay | Admitting: Cardiology

## 2011-08-12 ENCOUNTER — Ambulatory Visit (INDEPENDENT_AMBULATORY_CARE_PROVIDER_SITE_OTHER): Payer: Medicare Other | Admitting: *Deleted

## 2011-08-12 DIAGNOSIS — Z7901 Long term (current) use of anticoagulants: Secondary | ICD-10-CM

## 2011-08-12 DIAGNOSIS — I4891 Unspecified atrial fibrillation: Secondary | ICD-10-CM

## 2011-08-12 LAB — POCT INR: INR: 2.7

## 2011-08-17 ENCOUNTER — Encounter (HOSPITAL_COMMUNITY): Payer: Self-pay | Admitting: Emergency Medicine

## 2011-08-17 ENCOUNTER — Inpatient Hospital Stay (HOSPITAL_COMMUNITY)
Admission: EM | Admit: 2011-08-17 | Discharge: 2011-08-20 | DRG: 378 | Disposition: A | Discharge status: Home / Self Care | Payer: Medicare Other | Attending: Internal Medicine | Admitting: Internal Medicine

## 2011-08-17 DIAGNOSIS — K5730 Diverticulosis of large intestine without perforation or abscess without bleeding: Secondary | ICD-10-CM | POA: Diagnosis present

## 2011-08-17 DIAGNOSIS — I5032 Chronic diastolic (congestive) heart failure: Secondary | ICD-10-CM | POA: Diagnosis present

## 2011-08-17 DIAGNOSIS — R9389 Abnormal findings on diagnostic imaging of other specified body structures: Secondary | ICD-10-CM

## 2011-08-17 DIAGNOSIS — C8589 Other specified types of non-Hodgkin lymphoma, extranodal and solid organ sites: Secondary | ICD-10-CM | POA: Diagnosis present

## 2011-08-17 DIAGNOSIS — Z7982 Long term (current) use of aspirin: Secondary | ICD-10-CM

## 2011-08-17 DIAGNOSIS — I2510 Atherosclerotic heart disease of native coronary artery without angina pectoris: Secondary | ICD-10-CM | POA: Diagnosis present

## 2011-08-17 DIAGNOSIS — I10 Essential (primary) hypertension: Secondary | ICD-10-CM | POA: Diagnosis present

## 2011-08-17 DIAGNOSIS — I5041 Acute combined systolic (congestive) and diastolic (congestive) heart failure: Secondary | ICD-10-CM

## 2011-08-17 DIAGNOSIS — Z79899 Other long term (current) drug therapy: Secondary | ICD-10-CM

## 2011-08-17 DIAGNOSIS — K648 Other hemorrhoids: Secondary | ICD-10-CM | POA: Diagnosis present

## 2011-08-17 DIAGNOSIS — I4891 Unspecified atrial fibrillation: Secondary | ICD-10-CM | POA: Diagnosis present

## 2011-08-17 DIAGNOSIS — N179 Acute kidney failure, unspecified: Secondary | ICD-10-CM | POA: Diagnosis present

## 2011-08-17 DIAGNOSIS — I509 Heart failure, unspecified: Secondary | ICD-10-CM | POA: Diagnosis present

## 2011-08-17 DIAGNOSIS — E119 Type 2 diabetes mellitus without complications: Secondary | ICD-10-CM | POA: Diagnosis present

## 2011-08-17 DIAGNOSIS — F068 Other specified mental disorders due to known physiological condition: Secondary | ICD-10-CM | POA: Diagnosis present

## 2011-08-17 DIAGNOSIS — Z7901 Long term (current) use of anticoagulants: Secondary | ICD-10-CM

## 2011-08-17 DIAGNOSIS — D62 Acute posthemorrhagic anemia: Secondary | ICD-10-CM | POA: Diagnosis present

## 2011-08-17 DIAGNOSIS — I119 Hypertensive heart disease without heart failure: Secondary | ICD-10-CM

## 2011-08-17 DIAGNOSIS — K922 Gastrointestinal hemorrhage, unspecified: Principal | ICD-10-CM | POA: Diagnosis present

## 2011-08-17 LAB — GLUCOSE, CAPILLARY: Glucose-Capillary: 102 mg/dL — ABNORMAL HIGH (ref 70–99)

## 2011-08-17 LAB — CBC WITH DIFFERENTIAL/PLATELET
HCT: 24.5 % — ABNORMAL LOW (ref 39.0–52.0)
Hemoglobin: 7.6 g/dL — ABNORMAL LOW (ref 13.0–17.0)
Lymphocytes Relative: 19 % (ref 12–46)
Lymphs Abs: 1.6 10*3/uL (ref 0.7–4.0)
MCHC: 31 g/dL (ref 30.0–36.0)
Monocytes Absolute: 0.6 10*3/uL (ref 0.1–1.0)
Monocytes Relative: 7 % (ref 3–12)
Neutro Abs: 6.1 10*3/uL (ref 1.7–7.7)
Neutrophils Relative %: 72 % (ref 43–77)
RBC: 2.66 MIL/uL — ABNORMAL LOW (ref 4.22–5.81)
WBC: 8.5 10*3/uL (ref 4.0–10.5)

## 2011-08-17 LAB — COMPREHENSIVE METABOLIC PANEL
Alkaline Phosphatase: 92 U/L (ref 39–117)
BUN: 41 mg/dL — ABNORMAL HIGH (ref 6–23)
CO2: 28 mEq/L (ref 19–32)
Chloride: 99 mEq/L (ref 96–112)
Creatinine, Ser: 2.03 mg/dL — ABNORMAL HIGH (ref 0.50–1.35)
GFR calc non Af Amer: 30 mL/min — ABNORMAL LOW (ref >90)
Glucose, Bld: 144 mg/dL — ABNORMAL HIGH (ref 70–99)
Potassium: 3.8 mEq/L (ref 3.5–5.1)
Total Bilirubin: 0.3 mg/dL (ref 0.3–1.2)

## 2011-08-17 LAB — PREPARE RBC (CROSSMATCH)

## 2011-08-17 LAB — PROTIME-INR
INR: 2.95 — ABNORMAL HIGH (ref 0.00–1.49)
Prothrombin Time: 31.2 seconds — ABNORMAL HIGH (ref 11.6–15.2)

## 2011-08-17 MED ORDER — DEXTROSE 5 % IV SOLN
10.0000 mg | Freq: Once | INTRAVENOUS | Status: DC
  Filled 2011-08-17: qty 1

## 2011-08-17 MED ORDER — SODIUM CHLORIDE 0.9 % IV SOLN
INTRAVENOUS | Status: DC
  Administered 2011-08-17: 20:00:00 via INTRAVENOUS

## 2011-08-17 MED ORDER — FUROSEMIDE 10 MG/ML IJ SOLN
20.0000 mg | Freq: Once | INTRAMUSCULAR | Status: AC
  Administered 2011-08-18: 20 mg via INTRAVENOUS
  Filled 2011-08-17: qty 2

## 2011-08-17 MED ORDER — PANTOPRAZOLE SODIUM 40 MG IV SOLR
40.0000 mg | Freq: Once | INTRAVENOUS | Status: AC
  Administered 2011-08-17: 40 mg via INTRAVENOUS
  Filled 2011-08-17: qty 40

## 2011-08-17 NOTE — H&P (Signed)
[facility] History and Physical  Jesse Lambert:[id number] DOB: [birth year] DOA: 08/17/2011  Referring physician: ER PCP: Tonye Pearson, MD   Chief Complaint: dizziness/dark stools  HPI:  This is a 78 year old African American male whose chief complaint tonight dizziness. He states he's been lightheaded since yesterday. 3 days ago patient noticed he's had dark stools. He was on iron and he stopped taking it. Patient denies any bright red blood in the stools or abdominal pain. Patient states he did vomit times one but it was the food he had just eaten.  Patient had a stent placed in July of 2013 and was placed on Plavix. Patient is also on Coumadin for A. Fib.  In the ER patient was found to have a low hemoglobin and increased creatinine. Hospital service consulted to admit for GI bleed.  No fevers no chills no current chest pain  Review of Systems:  All systems reviewed, negative unless stated above   Past Medical History  Diagnosis Date  . Diabetes mellitus   . Non Hodgkin's lymphoma     B cell, high-grade  . Atrial fibrillation     Permanent, Coumadin therapy  . HTN (hypertension)   . Chronic diastolic heart failure   . Anemia   . Dementia   . DM2 (diabetes mellitus, type 2)   . Cancer    Past Surgical History  Procedure Date  . No past surgeries    Social History:  reports that he quit smoking about 41 years ago. His smoking use included Cigarettes. He has never used smokeless tobacco. He reports that he does not drink alcohol or use illicit drugs. From home   No Known Allergies  Family History  Problem Relation Age of Onset  . Heart attack Brother     and sister  . Diabetes Other     famiyl hx     Prior to Admission medications   Medication Sig Start Date End Date Taking? Authorizing Provider  aspirin 81 MG chewable tablet Chew 81 mg by mouth daily. 07/24/11  Yes Historical Provider, MD  atorvastatin (LIPITOR) 20 MG tablet Take 10 mg by  mouth daily.   Yes Historical Provider, MD  bumetanide (BUMEX) 1 MG tablet Take 2 mg by mouth daily. Prescribed from [place] hospitalization 07/24/11  Yes Historical Provider, MD  clopidogrel (PLAVIX) 75 MG tablet Take 75 mg by mouth daily. [facility] 07/24/11  Yes Historical Provider, MD  Cyanocobalamin (VITAMIN B-12 CR PO) Take 1 tablet by mouth daily.    Yes Historical Provider, MD  diltiazem (CARDIZEM CD) 120 MG 24 hr capsule Take 120 mg by mouth daily.   Yes Historical Provider, MD  GLIPIZIDE PO Take 1 tablet by mouth 2 (two) times daily.   Yes Historical Provider, MD  lisinopril (PRINIVIL,ZESTRIL) 5 MG tablet Take 2.5 mg by mouth daily.   Yes Historical Provider, MD  metoprolol (TOPROL XL) 200 MG 24 hr tablet Take 200 mg by mouth daily. Prescribed at [facility] hopital[facility] 07/24/11  Yes Historical Provider, MD  Multiple Vitamin (MULTIVITAMIN WITH MINERALS) TABS Take 1 tablet by mouth daily.   Yes Historical Provider, MD  warfarin (COUMADIN) 5 MG tablet Take 5-7.5 mg by mouth daily. 1.5 tab on MWF, 1 tab daily otherwise.   Yes Historical Provider, MD   Physical Exam: Filed Vitals:   08/17/11 1745 08/17/11 1806 08/17/11 1815 08/17/11 1830  BP:  102/48 95/44 97/54   Pulse:      Temp: 98.4 F (36.9 C)  TempSrc:      Resp:   21 21  Height:      Weight:      SpO2:         General:  A+Ox3, wife at bedside  Eyes: WNL  ENT: WNL  Neck: no JVD  Cardiovascular: rrr  Respiratory: clear anterior  Abdomen: +Bs, soft, NT/ND  Skin: no rashes or lesions  Musculoskeletal: moves all 4 extremitities  Psychiatric: normal mood and affect  Neurologic: no focal deficits  Labs on Admission:  Basic Metabolic Panel:  Lab 08/17/11 1610  NA 138  K 3.8  CL 99  CO2 28  GLUCOSE 144*  BUN 41*  CREATININE 2.03*  CALCIUM 8.8  MG --  PHOS --   Liver Function Tests:  Lab 08/17/11 1800  AST 31  ALT 25  ALKPHOS 92  BILITOT 0.3  PROT 6.6  ALBUMIN 3.1*   No results  found for this basename: LIPASE:5,AMYLASE:5 in the last 168 hours No results found for this basename: AMMONIA:5 in the last 168 hours CBC:  Lab 08/17/11 1800  WBC 8.5  NEUTROABS 6.1  HGB 7.6*  HCT 24.5*  MCV 92.1  PLT 257   Cardiac Enzymes: No results found for this basename: CKTOTAL:5,CKMB:5,CKMBINDEX:5,TROPONINI:5 in the last 168 hours  BNP (last 3 results) No results found for this basename: PROBNP:3 in the last 8760 hours CBG:  Lab 08/17/11 1843  GLUCAP 102*    Radiological Exams on Admission: No results found.    Assessment/Plan Principal Problem:  *Acute blood loss anemia Active Problems:  NON-HODGKIN'S LYMPHOMA  Atrial fibrillation  Chronic diastolic congestive heart failure  AKI (acute kidney injury)  Diabetes mellitus   Plan to admit patient to step down unit for close monitoring. Patient will be transfused 2 units of packed red blood cells. Patient will also be given FFP. These will be given cautiously as patient has a history of diastolic congestive heart failure. Lasix will be given if patient shows any signs of volume overload. GI was called, Dr. Madilyn Fireman, will be happy to see the a.m. Patient will be made n.p.o. after midnight. Protonix drip will be started. Laboratory data will be obtained in the a.m. Sliding scale insulin will be started.  Blood pressure medicines will be held as blood pressure is low at this point and will need to be restarted once blood pressure improves.   In the a.m. cardiology should be consulted as patient is on Coumadin for A. fib as well as Plavix for a recent stent.   Code Status: full Family Communication: wife at bedside Disposition Plan: home when better  Time spent: 70 min  Marlin Canary [facility] Pager [phone]  If 7PM-7AM, please contact night-coverage [email or web address] Password [facility] 08/17/2011, 10:36 PM

## 2011-08-17 NOTE — ED Provider Notes (Signed)
History     CSN: [id number]  Arrival date & time 08/17/11  1715   First MD Initiated Contact with Patient 08/17/11 1931      Chief Complaint  Patient presents with  . Dizziness    (Consider location/radiation/quality/duration/timing/severity/associated sxs/prior treatment) The history is provided by the patient.   Patient here complaining of dizziness and lightheadedness since yesterday. Has a black stools he is on Coumadin for A. fib. Denies any bloody stools. Symptoms are worse when he stands up. Denies any chest pain chest pressure does note some dyspnea. No prior history of same. No medications used prior to arrival. Patient denies abdominal pain Past Medical History  Diagnosis Date  . Diabetes mellitus   . Non Hodgkin's lymphoma     B cell, high-grade  . Atrial fibrillation     Permanent, Coumadin therapy  . HTN (hypertension)   . Chronic diastolic heart failure   . Anemia   . Dementia   . DM2 (diabetes mellitus, type 2)   . Cancer     History reviewed. No pertinent past surgical history.  Family History  Problem Relation Age of Onset  . Heart attack Brother     and sister  . Diabetes Other     famiyl hx    History  Substance Use Topics  . Smoking status: Former Smoker    Types: Cigarettes    Quit date: 07/28/1970  . Smokeless tobacco: Not on file  . Alcohol Use: No      Review of Systems  All other systems reviewed and are negative.    Allergies  Review of patient's allergies indicates no known allergies.  Home Medications   Current Outpatient Rx  Name Route Sig Dispense Refill  . ASPIRIN 81 MG PO CHEW Oral Chew 81 mg by mouth daily.    . ATORVASTATIN CALCIUM 20 MG PO TABS Oral Take 10 mg by mouth daily.    . BUMETANIDE 1 MG PO TABS Oral Take 2 mg by mouth daily. Prescribed from [place] hospitalization    . CLOPIDOGREL BISULFATE 75 MG PO TABS Oral Take 75 mg by mouth daily. Prescribed by [facility] hospitalization    . VITAMIN B-12 CR PO Oral Take 1  tablet by mouth daily.     Marland Kitchen DILTIAZEM HCL ER COATED BEADS 120 MG PO CP24 Oral Take 120 mg by mouth daily.    Marland Kitchen GLIPIZIDE PO Oral Take 1 tablet by mouth 2 (two) times daily.    Marland Kitchen LISINOPRIL 5 MG PO TABS Oral Take 2.5 mg by mouth daily.    Marland Kitchen METOPROLOL SUCCINATE ER 200 MG PO TB24 Oral Take 200 mg by mouth daily. Prescribed at [facility] hopitalization    . ADULT MULTIVITAMIN W/MINERALS CH Oral Take 1 tablet by mouth daily.    . WARFARIN SODIUM 5 MG PO TABS Oral Take 5-7.5 mg by mouth daily. 1.5 tab on MWF, 1 tab daily otherwise.      BP 97/54  Pulse 25  Temp 98.4 F (36.9 C) (Oral)  Resp 21  Ht 5\' 9"  (1.753 m)  Wt 195 lb (88.451 kg)  BMI 28.80 kg/m2  SpO2 81%  Physical Exam  Nursing note and vitals reviewed. Constitutional: He is oriented to person, place, and time. He appears well-developed and well-nourished.  Non-toxic appearance. No distress.  HENT:  Head: Normocephalic and atraumatic.  Eyes: Conjunctivae, EOM and lids are normal. Pupils are equal, round, and reactive to light.  Neck: Normal range of motion. Neck supple. No tracheal  deviation present. No mass present.  Cardiovascular: Normal rate, regular rhythm and normal heart sounds.  Exam reveals no gallop.   No murmur heard. Pulmonary/Chest: Effort normal and breath sounds normal. No stridor. No respiratory distress. He has no decreased breath sounds. He has no wheezes. He has no rhonchi. He has no rales.  Abdominal: Soft. Normal appearance and bowel sounds are normal. He exhibits no distension. There is no tenderness. There is no rigidity, no rebound, no guarding and no CVA tenderness.  Genitourinary: Guaiac positive stool.  Musculoskeletal: Normal range of motion. He exhibits no edema and no tenderness.  Neurological: He is alert and oriented to person, place, and time. He has normal strength. No cranial nerve deficit or sensory deficit. GCS eye subscore is 4. GCS verbal subscore is 5. GCS motor subscore is 6.  Skin: Skin is warm  and dry. No abrasion and no rash noted.  Psychiatric: He has a normal mood and affect. His speech is normal and behavior is normal.    ED Course  Procedures (including critical care time)  Labs Reviewed  GLUCOSE, CAPILLARY - Abnormal; Notable for the following:    Glucose-Capillary 102 (*)     All other components within normal limits  CBC WITH DIFFERENTIAL  COMPREHENSIVE METABOLIC PANEL  TYPE AND SCREEN  PROTIME-INR   No results found.   No diagnosis found.    MDM  Patient given IV fluids for his hypotension and has responded well. Post transfusion ordered with packed red blood cells and patient was also given transfusion of fresh frozen plasma along with vitamin K. Blood pressure stabilized and he will be admitted to the hospitalist service   CRITICAL CARE Performed by: Toy Baker   Total critical care time: 75  Critical care time was exclusive of separately billable procedures and treating other patients.  Critical care was necessary to treat or prevent imminent or life-threatening deterioration.  Critical care was time spent personally by me on the following activities: development of treatment plan with patient and/or surrogate as well as nursing, discussions with consultants, evaluation of patient's response to treatment, examination of patient, obtaining history from patient or surrogate, ordering and performing treatments and interventions, ordering and review of laboratory studies, ordering and review of radiographic studies, pulse oximetry and re-evaluation of patient's condition.         Toy Baker, MD 08/17/11 2157

## 2011-08-17 NOTE — ED Notes (Signed)
Pt and wife state pt has had nausea,dizziness and blurred vision since yesterday at 7am. Pt states he felt "a little better" yesterday afternoon but back same way again.

## 2011-08-18 ENCOUNTER — Telehealth: Payer: Self-pay | Admitting: *Deleted

## 2011-08-18 ENCOUNTER — Encounter (HOSPITAL_COMMUNITY): Payer: Self-pay

## 2011-08-18 ENCOUNTER — Encounter (HOSPITAL_COMMUNITY)
Admission: EM | Disposition: A | Discharge status: Home / Self Care | Payer: Self-pay | Source: Home / Self Care | Attending: Internal Medicine

## 2011-08-18 ENCOUNTER — Ambulatory Visit: Payer: Medicare Other | Admitting: Oncology

## 2011-08-18 ENCOUNTER — Other Ambulatory Visit: Payer: Self-pay | Admitting: Medical Oncology

## 2011-08-18 ENCOUNTER — Other Ambulatory Visit: Payer: Medicare Other | Admitting: Lab

## 2011-08-18 DIAGNOSIS — K922 Gastrointestinal hemorrhage, unspecified: Secondary | ICD-10-CM | POA: Diagnosis present

## 2011-08-18 HISTORY — PX: ESOPHAGOGASTRODUODENOSCOPY: SHX5428

## 2011-08-18 LAB — PROTIME-INR
INR: 2.15 — ABNORMAL HIGH (ref 0.00–1.49)
Prothrombin Time: 23.4 seconds — ABNORMAL HIGH (ref 11.6–15.2)
Prothrombin Time: 24.4 seconds — ABNORMAL HIGH (ref 11.6–15.2)

## 2011-08-18 LAB — CBC
HCT: 24.8 % — ABNORMAL LOW (ref 39.0–52.0)
MCV: 88.6 fL (ref 78.0–100.0)
Platelets: 170 10*3/uL (ref 150–400)
RBC: 2.8 MIL/uL — ABNORMAL LOW (ref 4.22–5.81)
RDW: 17.7 % — ABNORMAL HIGH (ref 11.5–15.5)
RDW: 17.8 % — ABNORMAL HIGH (ref 11.5–15.5)
WBC: 5.4 10*3/uL (ref 4.0–10.5)
WBC: 4.5 10*3/uL (ref 4.0–10.5)

## 2011-08-18 LAB — GLUCOSE, CAPILLARY
Glucose-Capillary: 209 mg/dL — ABNORMAL HIGH (ref 70–99)
Glucose-Capillary: 115 mg/dL — ABNORMAL HIGH (ref 70–99)
Glucose-Capillary: 100 mg/dL — ABNORMAL HIGH (ref 70–99)

## 2011-08-18 LAB — BASIC METABOLIC PANEL
CO2: 27 mEq/L (ref 19–32)
Chloride: 104 mEq/L (ref 96–112)
Creatinine, Ser: 1.58 mg/dL — ABNORMAL HIGH (ref 0.50–1.35)
Glucose, Bld: 132 mg/dL — ABNORMAL HIGH (ref 70–99)

## 2011-08-18 LAB — MRSA PCR SCREENING: MRSA by PCR: NEGATIVE

## 2011-08-18 LAB — HEMOGLOBIN A1C: Mean Plasma Glucose: 154 mg/dL — ABNORMAL HIGH (ref <117)

## 2011-08-18 SURGERY — EGD (ESOPHAGOGASTRODUODENOSCOPY)
Anesthesia: Moderate Sedation

## 2011-08-18 MED ORDER — INSULIN ASPART 100 UNIT/ML ~~LOC~~ SOLN
0.0000 [IU] | Freq: Three times a day (TID) | SUBCUTANEOUS | Status: DC
  Administered 2011-08-19: 1 [IU] via SUBCUTANEOUS
  Administered 2011-08-20: 7 [IU] via SUBCUTANEOUS

## 2011-08-18 MED ORDER — SODIUM CHLORIDE 0.9 % IV SOLN
80.0000 mg | Freq: Once | INTRAVENOUS | Status: AC
  Administered 2011-08-18: 80 mg via INTRAVENOUS
  Filled 2011-08-18: qty 80

## 2011-08-18 MED ORDER — SODIUM CHLORIDE 0.9 % IV SOLN
INTRAVENOUS | Status: AC
  Administered 2011-08-18: 100 mL/h via INTRAVENOUS

## 2011-08-18 MED ORDER — SODIUM CHLORIDE 0.9 % IJ SOLN
3.0000 mL | Freq: Two times a day (BID) | INTRAMUSCULAR | Status: DC
  Administered 2011-08-18 – 2011-08-19 (×5): 3 mL via INTRAVENOUS

## 2011-08-18 MED ORDER — PANTOPRAZOLE SODIUM 40 MG IV SOLR
40.0000 mg | Freq: Two times a day (BID) | INTRAVENOUS | Status: DC
  Administered 2011-08-18 – 2011-08-20 (×4): 40 mg via INTRAVENOUS
  Filled 2011-08-18 (×8): qty 40

## 2011-08-18 MED ORDER — ONDANSETRON HCL 4 MG PO TABS: 4.0000 mg | ORAL_TABLET | Freq: Four times a day (QID) | ORAL | Status: DC | PRN

## 2011-08-18 MED ORDER — FENTANYL CITRATE 0.05 MG/ML IJ SOLN
INTRAMUSCULAR | Status: AC
  Filled 2011-08-18: qty 2

## 2011-08-18 MED ORDER — SODIUM CHLORIDE 0.9 % IJ SOLN: 3.0000 mL | INTRAMUSCULAR | Status: DC | PRN

## 2011-08-18 MED ORDER — MIDAZOLAM HCL 10 MG/2ML IJ SOLN
INTRAMUSCULAR | Status: AC
  Filled 2011-08-18: qty 2

## 2011-08-18 MED ORDER — MIDAZOLAM HCL 10 MG/2ML IJ SOLN
INTRAMUSCULAR | Status: DC | PRN
  Administered 2011-08-18: 2 mg via INTRAVENOUS
  Administered 2011-08-18: 1 mg via INTRAVENOUS

## 2011-08-18 MED ORDER — INSULIN ASPART 100 UNIT/ML ~~LOC~~ SOLN
0.0000 [IU] | Freq: Every day | SUBCUTANEOUS | Status: DC
  Administered 2011-08-18: 2 [IU] via SUBCUTANEOUS

## 2011-08-18 MED ORDER — SODIUM CHLORIDE 0.9 % IV SOLN
INTRAVENOUS | Status: DC
  Administered 2011-08-18: 1000 mL via INTRAVENOUS

## 2011-08-18 MED ORDER — FENTANYL CITRATE 0.05 MG/ML IJ SOLN
INTRAMUSCULAR | Status: DC | PRN
  Administered 2011-08-18 (×2): 25 ug via INTRAVENOUS

## 2011-08-18 MED ORDER — BUTAMBEN-TETRACAINE-BENZOCAINE 2-2-14 % EX AERO
INHALATION_SPRAY | CUTANEOUS | Status: DC | PRN
  Administered 2011-08-18: 2 via TOPICAL

## 2011-08-18 MED ORDER — SODIUM CHLORIDE 0.9 % IJ SOLN: 3.0000 mL | Freq: Two times a day (BID) | INTRAMUSCULAR | Status: DC

## 2011-08-18 MED ORDER — ONDANSETRON HCL 4 MG/2ML IJ SOLN: 4.0000 mg | Freq: Four times a day (QID) | INTRAMUSCULAR | Status: DC | PRN

## 2011-08-18 MED ORDER — SODIUM CHLORIDE 0.9 % IV SOLN
8.0000 mg/h | INTRAVENOUS | Status: DC
  Administered 2011-08-18: 8 mg/h via INTRAVENOUS
  Filled 2011-08-18 (×3): qty 80

## 2011-08-18 MED ORDER — SODIUM CHLORIDE 0.9 % IV SOLN: 250.0000 mL | INTRAVENOUS | Status: DC | PRN

## 2011-08-18 NOTE — H&P (View-Only) (Signed)
[facility] PROGRESS NOTE  Jesse Lambert ZOX:[id number] DOB: [birth year] DOA: 08/17/2011 PCP: Tonye Pearson, MD  Assessment/Plan: Principal Problem:  *Acute blood loss anemia Active Problems:  NON-HODGKIN'S LYMPHOMA  Atrial fibrillation  Chronic diastolic congestive heart failure  AKI (acute kidney injury)  Diabetes mellitus  1. GI bleeding EGD today, follow CBC, status post transfusion of 2 units of packed red blood cells and FFP monitor INR 2. Atrial fibrillation on anticoagulation holding Coumadin for now 3. Coronary artery disease bare-metal stent in the [facility] last month, on Plavix on hold right now  Code Status: full Family Communication: family updated about patient's clinical progress Disposition Plan:  As above    Brief narrative: 78 year old African American male whose chief complaint tonight dizziness. He states he's been lightheaded since yesterday. 3 days ago patient noticed he's had dark stools   Consultants:  Gastroenterology  Procedures:  Endoscopy  Antibiotics:  None  HPI/Subjective: No Bleeding overnight  Objective: Filed Vitals:   08/18/11 0815 08/18/11 0830 08/18/11 0915 08/18/11 0941  BP: 126/70 126/70 130/69 122/66  Pulse: 83 81 81   Temp: 98.3 F (36.8 C) 98.3 F (36.8 C) 98 F (36.7 C) 98.3 F (36.8 C)  TempSrc:      Resp: 20 19 19    Height:      Weight:      SpO2:  100%      Intake/Output Summary (Last 24 hours) at 08/18/11 1058 Last data filed at 08/18/11 0941  Gross per 24 hour  Intake 2352.66 ml  Output   1100 ml  Net 1252.66 ml    Exam:  HENT:  Head: Atraumatic.  Nose: Nose normal.  Mouth/Throat: Oropharynx is clear and moist.  Eyes: Conjunctivae are normal. Pupils are equal, round, and reactive to light. No scleral icterus.  Neck: Neck supple. No tracheal deviation present.  Cardiovascular: Normal rate, regular rhythm, normal heart sounds and intact distal pulses.  Pulmonary/Chest: Effort  normal and breath sounds normal. No respiratory distress.  Abdominal: Soft. Normal appearance and bowel sounds are normal. She exhibits no distension. There is no tenderness.  Musculoskeletal: She exhibits no edema and no tenderness.  Neurological: She is alert. No cranial nerve deficit.    Data Reviewed: Basic Metabolic Panel:  Lab 08/18/11 8119 08/17/11 1800  NA 140 138  K 3.5 3.8  CL 104 99  CO2 27 28  GLUCOSE 132* 144*  BUN 32* 41*  CREATININE 1.58* 2.03*  CALCIUM 8.3* 8.8  MG -- --  PHOS -- --    Liver Function Tests:  Lab 08/17/11 1800  AST 31  ALT 25  ALKPHOS 92  BILITOT 0.3  PROT 6.6  ALBUMIN 3.1*   No results found for this basename: LIPASE:5,AMYLASE:5 in the last 168 hours No results found for this basename: AMMONIA:5 in the last 168 hours  CBC:  Lab 08/18/11 1010 08/17/11 1800  WBC 5.4 8.5  NEUTROABS -- 6.1  HGB 8.1* 7.6*  HCT 24.8* 24.5*  MCV 88.6 92.1  PLT 171 257    Cardiac Enzymes: No results found for this basename: CKTOTAL:5,CKMB:5,CKMBINDEX:5,TROPONINI:5 in the last 168 hours BNP (last 3 results)  Basename 08/18/11 1010  PROBNP 2199.0*     CBG:  Lab 08/18/11 0146 08/17/11 1843  GLUCAP 209* 102*    Recent Results (from the past 240 hour(s))  MRSA PCR SCREENING     Status: Normal   Collection Time   08/18/11 12:40 AM      Component Value Range Status  Comment   MRSA by PCR NEGATIVE  NEGATIVE Final      Studies: No results found.  Scheduled Meds:   . sodium chloride   Intravenous STAT  . furosemide  20 mg Intravenous Once  . insulin aspart  0-5 Units Subcutaneous QHS  . insulin aspart  0-9 Units Subcutaneous TID WC  . pantoprazole (PROTONIX) IV  80 mg Intravenous Once  . pantoprazole  40 mg Intravenous Once  . sodium chloride  3 mL Intravenous Q12H  . DISCONTD: phytonadione (VITAMIN K) IV  10 mg Intravenous Once  . DISCONTD: sodium chloride  3 mL Intravenous Q12H   Continuous Infusions:   . pantoprozole (PROTONIX)  infusion 8 mg/hr (08/18/11 0507)  . DISCONTD: sodium chloride 200 mL/hr at 08/17/11 2005    Principal Problem:  *Acute blood loss anemia Active Problems:  NON-HODGKIN'S LYMPHOMA  Atrial fibrillation  Chronic diastolic congestive heart failure  AKI (acute kidney injury)  Diabetes mellitus    Time spent: 40 minutes   [facility]  [facility] Pager [phone]. If 8PM-8AM, please contact night-coverage at [email or web address], password [facility] 08/18/2011, 10:58 AM  LOS: 1 day

## 2011-08-18 NOTE — Progress Notes (Signed)
[facility] PROGRESS NOTE  Jesse Lambert MRN:[id number] DOB: 06/03/1932 DOA: 08/17/2011 PCP: DOOLITTLE, ROBERT P, MD  Assessment/Plan: Principal Problem:  *Acute blood loss anemia Active Problems:  NON-HODGKIN'S LYMPHOMA  Atrial fibrillation  Chronic diastolic congestive heart failure  AKI (acute kidney injury)  Diabetes mellitus  1. GI bleeding EGD today, follow CBC, status post transfusion of 2 units of packed red blood cells and FFP monitor INR 2. Atrial fibrillation on anticoagulation holding Coumadin for now 3. [facility] artery disease [facility], on Plavix on hold right now  Code Status: full Family Communication: family updated about patient's clinical progress Disposition Plan:  As above    Brief narrative: 79-year-old African American male whose chief complaint tonight dizziness. He states he's been lightheaded since yesterday. 3 days ago patient noticed he's had dark stools   Consultants:  Gastroenterology  Procedures:  Endoscopy  Antibiotics:  None  HPI/Subjective: No Bleeding overnight  Objective: Filed Vitals:   08/18/11 0815 08/18/11 0830 08/18/11 0915 08/18/11 0941  BP: 126/70 126/70 130/69 122/66  Pulse: 83 81 81   Temp: 98.3 F (36.8 C) 98.3 F (36.8 C) 98 F (36.7 C) 98.3 F (36.8 C)  TempSrc:      Resp: 20 19 19   Height:      Weight:      SpO2:  100%      Intake/Output Summary (Last 24 hours) at 08/18/11 1058 Last data filed at 08/18/11 0941  Gross per 24 hour  Intake 2352.66 ml  Output   1100 ml  Net 1252.66 ml    Exam:  HENT:  Head: Atraumatic.  Nose: Nose normal.  Mouth/Throat: Oropharynx is clear and moist.  Eyes: Conjunctivae are normal. Pupils are equal, round, and reactive to light. No scleral icterus.  Neck: Neck supple. No tracheal deviation present.  Cardiovascular: Normal rate, regular rhythm, normal heart sounds and intact distal pulses.  Pulmonary/Chest: Effort  normal and breath sounds normal. No respiratory distress.  Abdominal: Soft. Normal appearance and bowel sounds are normal. She exhibits no distension. There is no tenderness.  Musculoskeletal: She exhibits no edema and no tenderness.  Neurological: She is alert. No cranial nerve deficit.    Data Reviewed: Basic Metabolic Panel:  Lab 08/18/11 1010 08/17/11 1800  NA 140 138  K 3.5 3.8  CL 104 99  CO2 27 28  GLUCOSE 132* 144*  BUN 32* 41*  CREATININE 1.58* 2.03*  CALCIUM 8.3* 8.8  MG -- --  PHOS -- --    Liver Function Tests:  Lab 08/17/11 1800  AST 31  ALT 25  ALKPHOS 92  BILITOT 0.3  PROT 6.6  ALBUMIN 3.1*   No results found for this basename: LIPASE:5,AMYLASE:5 in the last 168 hours No results found for this basename: AMMONIA:5 in the last 168 hours  CBC:  Lab 08/18/11 1010 08/17/11 1800  WBC 5.4 8.5  NEUTROABS -- 6.1  HGB 8.1* 7.6*  HCT 24.8* 24.5*  MCV 88.6 92.1  PLT 171 257    Cardiac Enzymes: No results found for this basename: CKTOTAL:5,CKMB:5,CKMBINDEX:5,TROPONINI:5 in the last 168 hours BNP (last 3 results)  Basename 08/18/11 1010  PROBNP 2199.0*     CBG:  Lab 08/18/11 0146 08/17/11 1843  GLUCAP 209* 102*    Recent Results (from the past 240 hour(s))  MRSA PCR SCREENING     Status: Normal   Collection Time   08/18/11 12:40 AM      Component Value Range Status   Comment   MRSA by PCR NEGATIVE  NEGATIVE Final      Studies: No results found.  Scheduled Meds:   . sodium chloride   Intravenous STAT  . furosemide  20 mg Intravenous Once  . insulin aspart  0-5 Units Subcutaneous QHS  . insulin aspart  0-9 Units Subcutaneous TID WC  . pantoprazole (PROTONIX) IV  80 mg Intravenous Once  . pantoprazole  40 mg Intravenous Once  . sodium chloride  3 mL Intravenous Q12H  . DISCONTD: phytonadione (VITAMIN K) IV  10 mg Intravenous Once  . DISCONTD: sodium chloride  3 mL Intravenous Q12H   Continuous Infusions:   . pantoprozole (PROTONIX)  infusion 8 mg/hr (08/18/11 0507)  . DISCONTD: sodium chloride 200 mL/hr at 08/17/11 2005    Principal Problem:  *Acute blood loss anemia Active Problems:  NON-HODGKIN'S LYMPHOMA  Atrial fibrillation  Chronic diastolic congestive heart failure  AKI (acute kidney injury)  Diabetes mellitus    Time spent: 40 minutes   Daesha Insco  [facility] Pager [phone]. If 8PM-8AM, please contact night-coverage at [email or web address], password [email or web address] 08/18/2011, 10:58 AM  LOS: 1 day          

## 2011-08-18 NOTE — Consult Note (Signed)
Subjective:   HPI  The patient is a 78 year old African American male who began to feel weak a few days ago and started getting lightheaded. He began to pass black stools a couple of days ago. He came to the emergency room where he was evaluated and found to be anemic with heme-positive stool. He does have a history of atrial fibrillation and is on Coumadin. In July of this year he had a coronary stent placed and was put on Plavix. He had one episode of vomiting without hematemesis. He does take a baby aspirin a day. He states that years ago he had a peptic ulcer.  Review of Systems Denies chest pain or shortness of breath  Past Medical History  Diagnosis Date  . Diabetes mellitus   . Non Hodgkin's lymphoma     B cell, high-grade  . Atrial fibrillation     Permanent, Coumadin therapy  . HTN (hypertension)   . Chronic diastolic heart failure   . Anemia   . Dementia   . DM2 (diabetes mellitus, type 2)   . Cancer    Past Surgical History  Procedure Date  . No past surgeries    History   Social History  . Marital Status: Married    Spouse Name: N/A    Number of Children: N/A  . Years of Education: N/A   Occupational History  . Not on file.   Social History Main Topics  . Smoking status: Former Smoker    Types: Cigarettes    Quit date: 07/28/1970  . Smokeless tobacco: Never Used  . Alcohol Use: No  . Drug Use: No  . Sexually Active: Not on file   Other Topics Concern  . Not on file   Social History Narrative   Married, retired from [employer], gets regular exercise.    family history includes Diabetes in his other and Heart attack in his brother. Current facility-administered medications:0.9 %  sodium chloride infusion, , Intravenous, STAT, Toy Baker, MD, Last Rate: 100 mL/hr at 08/18/11 0258, 100 mL/hr at 08/18/11 0258;  0.9 %  sodium chloride infusion, 250 mL, Intravenous, PRN, Joseph Art, DO;  furosemide (LASIX) injection 20 mg, 20 mg, Intravenous, Once,  Jessica U Vann, DO, 20 mg at 08/18/11 0155 insulin aspart (novoLOG) injection 0-5 Units, 0-5 Units, Subcutaneous, QHS, Joseph Art, DO, 2 Units at 08/18/11 0157;  insulin aspart (novoLOG) injection 0-9 Units, 0-9 Units, Subcutaneous, TID WC, Jessica U Vann, DO;  ondansetron (ZOFRAN) injection 4 mg, 4 mg, Intravenous, Q6H PRN, Jessica U Vann, DO;  ondansetron (ZOFRAN) tablet 4 mg, 4 mg, Oral, Q6H PRN, Jessica U Vann, DO pantoprazole (PROTONIX) 80 mg in sodium chloride 0.9 % 100 mL IVPB, 80 mg, Intravenous, Once, Jessica U Vann, DO, 80 mg at 08/18/11 0259;  pantoprazole (PROTONIX) 80 mg in sodium chloride 0.9 % 250 mL infusion, 8 mg/hr, Intravenous, Continuous, Jessica U Vann, DO, Last Rate: 25 mL/hr at 08/18/11 0507, 8 mg/hr at 08/18/11 0507;  pantoprazole (PROTONIX) injection 40 mg, 40 mg, Intravenous, Once, Toy Baker, MD, 40 mg at 08/17/11 2305 sodium chloride 0.9 % injection 3 mL, 3 mL, Intravenous, Q12H, Jessica U Vann, DO, 3 mL at 08/18/11 0155;  sodium chloride 0.9 % injection 3 mL, 3 mL, Intravenous, PRN, Joseph Art, DO;  DISCONTD: 0.9 %  sodium chloride infusion, , Intravenous, Continuous, Toy Baker, MD, Last Rate: 200 mL/hr at 08/17/11 2005;  DISCONTD: phytonadione (VITAMIN K) 10 mg in dextrose 5 %  50 mL IVPB, 10 mg, Intravenous, Once, Toy Baker, MD DISCONTD: sodium chloride 0.9 % injection 3 mL, 3 mL, Intravenous, Q12H, Jessica U Vann, DO No Known Allergies   Objective:     BP 122/66  Pulse 81  Temp 98.3 F (36.8 C) (Oral)  Resp 19  Ht 5\' 9"  (1.753 m)  Wt 85.2 kg (187 lb 13.3 oz)  BMI 27.74 kg/m2  SpO2 100%  He is in no distress  Heart regular with grade 2/6 systolic murmur heard  Lungs clear  Abdomen: Bowel sounds normal, soft, nontender, no hepatosplenomegaly  Laboratory No components found with this basename: d1      Assessment:     Melena most likely secondary to upper GI bleed. Patient appears clinically stable at this time. He has received 2  units of packed red cells, and is currently receiving his second unit of fresh frozen plasma. Repeat labs for CBC, pro time, INR will be done shortly. He is on a Protonix drip.      Plan:     Review repeat labs. Schedule for EGD. Lab Results  Component Value Date   HGB 7.6* 08/17/2011   HGB 13.7 03/23/2011   HGB 14.7 01/24/2011   HGB 11.9* 06/28/2010   HGB 13.0 05/18/2010   HGB 15.6 12/08/2009   HGB 11.7* 05/20/2009   HGB 9.5* 12/24/2008   HGB 8.2* 11/26/2008   HCT 24.5* 08/17/2011   HCT 41.9 03/23/2011   HCT 44.2 01/24/2011   HCT 37.2* 06/28/2010   HCT 40.1 05/18/2010   HCT 47.0 12/08/2009   HCT 34.6* 05/20/2009   HCT 29.2* 12/24/2008   HCT 24.7* 11/26/2008   ALKPHOS 92 08/17/2011   ALKPHOS 74 01/24/2011   ALKPHOS 67 05/18/2010   ALKPHOS 67 05/18/2010   ALKPHOS 97 12/01/2009   ALKPHOS 76 05/20/2009   ALKPHOS 62 12/24/2008   AST 31 08/17/2011   AST 22 01/24/2011   AST 17 05/18/2010   AST 17 05/18/2010   AST 18 12/01/2009   AST 29 05/20/2009   AST 25 12/24/2008   ALT 25 08/17/2011   ALT 13 05/18/2010   ALT 13 05/18/2010   ALT 17 12/01/2009

## 2011-08-18 NOTE — Brief Op Note (Signed)
See endopro note for details. No bleeding seen on EGD. Start Clear liquid diet. Follow H/Hs.

## 2011-08-18 NOTE — Telephone Encounter (Signed)
Patient wife confirmed over the phone the new date and time for 08-2011 starting at 10:30am with labs

## 2011-08-18 NOTE — Progress Notes (Signed)
CARE MANAGEMENT NOTE 08/18/2011  Patient:  KOLLIN, UDELL   Account Number:  [id number]  Date Initiated:  08/18/2011  Documentation initiated by:  DAVIS,RHONDA  Subjective/Objective Assessment:   patient history of plavix pres, asa qd, now with vomting of bld, hgb 7.6 on admitt,     Action/Plan:   home   Anticipated DC Date:  08/19/2011   Anticipated DC Plan:  HOME/SELF CARE  In-house referral  NA      DC Planning Services  NA      PAC Choice  NA   Choice offered to / List presented to:  NA   DME arranged  NA      DME agency  NA     HH arranged  NA      HH agency  NA   Status of service:  In process, will continue to follow Medicare Important Message given?  NA - LOS <3 / Initial given by admissions (If response is "NO", the following Medicare IM given date fields will be blank) Date Medicare IM given:   Date Additional Medicare IM given:    Discharge Disposition:    Per UR Regulation:  Reviewed for med. necessity/level of care/duration of stay  If discussed at Long Length of Stay Meetings, dates discussed:    Comments:  [id number] Earlene Plater, RN, BSN, CCM: CHART REVIEWED AND UPDATED. NO DISCHARGE NEEDS PRESENT AT THIS TIME. CASE MANAGEMENT [id number]

## 2011-08-18 NOTE — Interval H&P Note (Signed)
History and Physical Interval Note:  08/18/2011 1:33 PM  [name]  has presented today for surgery, with the diagnosis of gib  The various methods of treatment have been discussed with the patient and family. After consideration of risks, benefits and other options for treatment, the patient has consented to  Procedure(s) (LRB): ESOPHAGOGASTRODUODENOSCOPY (EGD) (N/A) as a surgical intervention .  The patient's history has been reviewed, patient examined, no change in status, stable for surgery.  I have reviewed the patient's chart and labs.  Questions were answered to the patient's satisfaction.     Jesse Dina C.

## 2011-08-18 NOTE — Op Note (Signed)
[facility] [address] [place], [place]  [address]  ENDOSCOPY PROCEDURE REPORT  PATIENT:  Jesse Lambert, Jesse Lambert  MR#:  [id number] BIRTHDATE:  08/07/1932, 79 yrs. old  GENDER:  male  ENDOSCOPIST:  Charlott Rakes, MD Referred by:  hospital team  PROCEDURE DATE:  08/18/2011 PROCEDURE:  EGD with biopsy, 432[address] ASA CLASS:  Class III INDICATIONS:  melena  MEDICATIONS:   Fentanyl 50 mcg IV, Versed 3 mg IV, Cetacaine spray x 2  TOPICAL ANESTHETIC: see above  DESCRIPTION OF PROCEDURE:   After the risks benefits and alternatives of the procedure were thoroughly explained, informed consent was obtained.  The  endoscope was introduced through the mouth and advanced to the second portion of the duodenum, without limitations.  The instrument was slowly withdrawn as the mucosa was fully examined. <<PROCEDUREIMAGES>>  FINDINGS:  The endoscope was inserted into the oropharynx and esophagus was intubated.  The esophagus and gastroesophageal junction were normal in appearance. Endoscope was advanced into the stomach, which revealed minimal segmental erythema in the distal stomach consistent with antral gastritis. The pre-pyloric channel revealed flattening of the mucosa (consistent with scarring) and changes consistent with a past history of peptic ulcer disease. No ulcers noted in the stomach.  The endoscope was advanced to the duodenal bulb, which revealed diffuse edema with normal overlying mucosa. Duodenal biopsy taken of this edematous mucosa. The subsequent bleeding from the biopsy site stopped spontaneously. The second portion of duodenum was unremarkable. The endoscope was withdrawn back into the stomach and retroflexion revealed a normal proximal stomach. No bleeding or blood products seen on insertion of the endoscope.  COMPLICATIONS:  None  ENDOSCOPIC IMPRESSION:  1. Edematous duodenal bulb - s/p biopsy 2. Scarring of prepyloric channel due to previous  ulcer disease 3. No active bleeding or blood products seen (except for minimal bleeding from the duodenal biopsy)  RECOMMENDATIONS:    1. Clear liquids 2. Change to PPI IV Q 12 hours 3. Consider inpt colonoscopy if bleeding recurs or anemia persists  REPEAT EXAM:  N/A  ______________________________ Charlott Rakes, MD  CC:  n. eSIGNEDCharlott Rakes at 08/18/2011 02:17 PM  Merrie Roof, [id number]

## 2011-08-19 ENCOUNTER — Encounter (HOSPITAL_COMMUNITY): Payer: Self-pay | Admitting: Gastroenterology

## 2011-08-19 LAB — TYPE AND SCREEN
ABO/RH(D): O NEG
Antibody Screen: NEGATIVE
Unit division: 0

## 2011-08-19 LAB — CBC
MCV: 90.1 fL (ref 78.0–100.0)
Platelets: 167 10*3/uL (ref 150–400)
RDW: 17.5 % — ABNORMAL HIGH (ref 11.5–15.5)
WBC: 5.6 10*3/uL (ref 4.0–10.5)

## 2011-08-19 LAB — PROTIME-INR: INR: 2.38 — ABNORMAL HIGH (ref 0.00–1.49)

## 2011-08-19 LAB — PREPARE FRESH FROZEN PLASMA: Unit division: 0

## 2011-08-19 LAB — GLUCOSE, CAPILLARY
Glucose-Capillary: 78 mg/dL (ref 70–99)
Glucose-Capillary: 83 mg/dL (ref 70–99)

## 2011-08-19 LAB — BASIC METABOLIC PANEL
BUN: 21 mg/dL (ref 6–23)
GFR calc non Af Amer: 51 mL/min — ABNORMAL LOW (ref >90)
Glucose, Bld: 114 mg/dL — ABNORMAL HIGH (ref 70–99)
Potassium: 4 mEq/L (ref 3.5–5.1)

## 2011-08-19 MED ORDER — SODIUM CHLORIDE 0.9 % IV SOLN: INTRAVENOUS | Status: DC

## 2011-08-19 MED ORDER — FUROSEMIDE 40 MG PO TABS
40.0000 mg | ORAL_TABLET | Freq: Every day | ORAL | Status: DC
  Administered 2011-08-19 – 2011-08-20 (×2): 40 mg via ORAL
  Filled 2011-08-19 (×2): qty 1

## 2011-08-19 MED ORDER — PEG 3350-KCL-NA BICARB-NACL 420 G PO SOLR
4000.0000 mL | Freq: Once | ORAL | Status: AC
  Administered 2011-08-19: 4000 mL via ORAL
  Filled 2011-08-19: qty 4000

## 2011-08-19 NOTE — Progress Notes (Signed)
No obvious source for GI bleeding on EGD. Discussed colonoscopy and we will set up for tomorrow.

## 2011-08-19 NOTE — Progress Notes (Signed)
[facility] PROGRESS NOTE  Jesse Lambert ZOX:[id number] DOB: 1932/06/06 DOA: 08/17/2011 PCP: Tonye Pearson, MD  Assessment/Plan: Principal Problem:  *Acute blood loss anemia Active Problems:  NON-HODGKIN'S LYMPHOMA  Atrial fibrillation  Chronic diastolic congestive heart failure  AKI (acute kidney injury)  Diabetes mellitus  GI bleed  [facility]  PROGRESS NOTE  Jesse Lambert WJX:[id number] DOB: 10-05-1932 DOA: 08/17/2011  PCP: Tonye Pearson, MD  Assessment/Plan:  Principal Problem:  *Acute blood loss anemia  Active Problems:  NON-HODGKIN'S LYMPHOMA  Atrial fibrillation  Chronic diastolic congestive heart failure  AKI (acute kidney injury)  Diabetes mellitus   1. GI bleeding EGD today, follow CBC, status post transfusion of 2 units of packed red blood cells and FFP monitor INR, colonoscopy tomorrow, gastroenterology to clarify when to resume Plavix and Coumadin   2. Atrial fibrillation on anticoagulation holding Coumadin for now 3. Coronary artery disease bare-metal stent in the [facility] last month, on Plavix on hold right now  Code Status: full  Family Communication: family updated about patient's clinical progress  Disposition Plan: As above  Brief narrative:  78 year old African American male whose chief complaint tonight dizziness. He states he's been lightheaded since yesterday. 3 days ago patient noticed he's had dark stools  Consultants:  Gastroenterology Procedures:  Endoscopy Antibiotics:  None     Objective: Filed Vitals:   08/18/11 2000 08/19/11 0000 08/19/11 0355 08/19/11 0800  BP:  99/54 132/62 120/76  Pulse: 78  92 94  Temp: 98.7 F (37.1 C) 99 F (37.2 C) 98.4 F (36.9 C) 98.3 F (36.8 C)  TempSrc: Oral Oral Oral Oral  Resp: 22  25 19   Height:      Weight:      SpO2: 90%  97% 98%    Intake/Output Summary (Last 24 hours) at 08/19/11 1403 Last data filed at 08/19/11 1100  Gross per 24 hour  Intake   1545  ml  Output   1500 ml  Net     45 ml    Exam:  HENT:  Head: Atraumatic.  Nose: Nose normal.  Mouth/Throat: Oropharynx is clear and moist.  Eyes: Conjunctivae are normal. Pupils are equal, round, and reactive to light. No scleral icterus.  Neck: Neck supple. No tracheal deviation present.  Cardiovascular: Normal rate, regular rhythm, normal heart sounds and intact distal pulses.  Pulmonary/Chest: Effort normal and breath sounds normal. No respiratory distress.  Abdominal: Soft. Normal appearance and bowel sounds are normal. She exhibits no distension. There is no tenderness.  Musculoskeletal: She exhibits no edema and no tenderness.  Neurological: She is alert. No cranial nerve deficit.    Data Reviewed: Basic Metabolic Panel:  Lab 08/19/11 2130 08/18/11 1010 08/17/11 1800  NA 142 140 138  K 4.0 3.5 3.8  CL 108 104 99  CO2 24 27 28   GLUCOSE 114* 132* 144*  BUN 21 32* 41*  CREATININE 1.31 1.58* 2.03*  CALCIUM 8.1* 8.3* 8.8  MG -- -- --  PHOS -- -- --    Liver Function Tests:  Lab 08/17/11 1800  AST 31  ALT 25  ALKPHOS 92  BILITOT 0.3  PROT 6.6  ALBUMIN 3.1*   No results found for this basename: LIPASE:5,AMYLASE:5 in the last 168 hours No results found for this basename: AMMONIA:5 in the last 168 hours  CBC:  Lab 08/19/11 0330 08/18/11 1758 08/18/11 1010 08/17/11 1800  WBC 5.6 4.5 5.4 8.5  NEUTROABS -- -- -- 6.1  HGB 8.6* 8.4* 8.1*  7.6*  HCT 27.3* 26.2* 24.8* 24.5*  MCV 90.1 89.4 88.6 92.1  PLT 167 170 171 257    Cardiac Enzymes: No results found for this basename: CKTOTAL:5,CKMB:5,CKMBINDEX:5,TROPONINI:5 in the last 168 hours BNP (last 3 results)  Basename 08/18/11 1010  PROBNP 2199.0*     CBG:  Lab 08/19/11 1243 08/19/11 0740 08/18/11 2121 08/18/11 1616 08/18/11 1120  GLUCAP 145* 117* 120* 100* 115*    Recent Results (from the past 240 hour(s))  MRSA PCR SCREENING     Status: Normal   Collection Time   08/18/11 12:40 AM      Component Value  Range Status Comment   MRSA by PCR NEGATIVE  NEGATIVE Final      Studies: No results found.  Scheduled Meds:   . sodium chloride   Intravenous STAT  . insulin aspart  0-5 Units Subcutaneous QHS  . insulin aspart  0-9 Units Subcutaneous TID WC  . pantoprazole (PROTONIX) IV  40 mg Intravenous Q12H  . polyethylene glycol-electrolytes  4,000 mL Oral Once  . sodium chloride  3 mL Intravenous Q12H   Continuous Infusions:   . DISCONTD: sodium chloride 1,000 mL (08/18/11 1509)  . DISCONTD: pantoprozole (PROTONIX) infusion 8 mg/hr (08/18/11 0507)    Principal Problem:  *Acute blood loss anemia Active Problems:  NON-HODGKIN'S LYMPHOMA  Atrial fibrillation  Chronic diastolic congestive heart failure  AKI (acute kidney injury)  Diabetes mellitus  GI bleed    Time spent: 40 minutes   [facility] Pager [phone]. If 8PM-8AM, please contact night-coverage at [email or web address], password [facility] 08/19/2011, 2:03 PM  LOS: 2 days

## 2011-08-20 ENCOUNTER — Encounter (HOSPITAL_COMMUNITY)
Admission: EM | Disposition: A | Discharge status: Home / Self Care | Payer: Self-pay | Source: Home / Self Care | Attending: Internal Medicine

## 2011-08-20 ENCOUNTER — Encounter (HOSPITAL_COMMUNITY): Payer: Self-pay | Admitting: *Deleted

## 2011-08-20 HISTORY — PX: COLONOSCOPY: SHX5424

## 2011-08-20 LAB — PROTIME-INR: Prothrombin Time: 28.1 seconds — ABNORMAL HIGH (ref 11.6–15.2)

## 2011-08-20 LAB — CBC
Hemoglobin: 9 g/dL — ABNORMAL LOW (ref 13.0–17.0)
MCH: 29.1 pg (ref 26.0–34.0)
MCHC: 32.8 g/dL (ref 30.0–36.0)
MCV: 88.7 fL (ref 78.0–100.0)

## 2011-08-20 SURGERY — COLONOSCOPY
Anesthesia: Moderate Sedation

## 2011-08-20 MED ORDER — BUMETANIDE 2 MG PO TABS
2.0000 mg | ORAL_TABLET | Freq: Two times a day (BID) | ORAL | Status: DC
  Filled 2011-08-20 (×2): qty 1

## 2011-08-20 MED ORDER — WARFARIN SODIUM 4 MG PO TABS
4.0000 mg | ORAL_TABLET | Freq: Once | ORAL | Status: DC
  Filled 2011-08-20: qty 1

## 2011-08-20 MED ORDER — MIDAZOLAM HCL 5 MG/5ML IJ SOLN
INTRAMUSCULAR | Status: DC | PRN
  Administered 2011-08-20 (×2): 2 mg via INTRAVENOUS

## 2011-08-20 MED ORDER — FENTANYL CITRATE 0.05 MG/ML IJ SOLN
INTRAMUSCULAR | Status: AC
  Filled 2011-08-20: qty 2

## 2011-08-20 MED ORDER — MIDAZOLAM HCL 10 MG/2ML IJ SOLN
INTRAMUSCULAR | Status: AC
  Filled 2011-08-20: qty 2

## 2011-08-20 MED ORDER — PANTOPRAZOLE SODIUM 40 MG PO TBEC: 40.0000 mg | DELAYED_RELEASE_TABLET | Freq: Two times a day (BID) | ORAL | Status: DC

## 2011-08-20 MED ORDER — DIPHENHYDRAMINE HCL 50 MG/ML IJ SOLN
INTRAMUSCULAR | Status: AC
Start: 2011-08-20 — End: 2011-08-20
  Filled 2011-08-20: qty 1

## 2011-08-20 MED ORDER — WARFARIN - PHARMACIST DOSING INPATIENT: Freq: Every day | Status: DC

## 2011-08-20 MED ORDER — CLOPIDOGREL BISULFATE 75 MG PO TABS
75.0000 mg | ORAL_TABLET | Freq: Every day | ORAL | Status: DC
  Filled 2011-08-20: qty 1

## 2011-08-20 MED ORDER — FENTANYL CITRATE 0.05 MG/ML IJ SOLN
INTRAMUSCULAR | Status: DC | PRN
  Administered 2011-08-20 (×2): 25 ug via INTRAVENOUS

## 2011-08-20 NOTE — Op Note (Signed)
Colonoscopy  Indication: Gastrointestinal bleeding  Premedications: Fentanyl 50 mcg IV, Versed 4 mg IV  Procedure: With the patient in the left lateral decubitus position a rectal exam was performed and no masses were felt. The colonoscope was inserted into the rectum and advanced around the colon to the cecum. Cecal landmarks were identified. There was no blood seen in the colon. The cecum and descending colon looked normal. The transverse colon looked normal. The descending colon and sigmoid revealed diverticulosis. The rectum was normal. There were some small internal hemorrhoids noted on retroflexion in the rectum. He tolerated the procedure well without complications.  Impression: Diverticulosis, internal hemorrhoids. There is no evidence of active bleeding.  Plan: At this time it appears okay to resume anticoagulation and antiplatelet therapy for his cardiac issues.  We will sign off at this point. Call us back if needed.

## 2011-08-20 NOTE — Progress Notes (Signed)
ANTICOAGULATION CONSULT NOTE - Initial Consult  Pharmacy Consult for Coumadin, resume  Indication: atrial fibrillation  No Known Allergies  Patient Measurements: Height: 5\' 9"  (175.3 cm) Weight: 187 lb 13.3 oz (85.2 kg) IBW/kg (Calculated) : 70.7    Vital Signs: Temp: 98.4 F (36.9 C) (08/17 0443) Temp src: Oral (08/17 0443) BP: 114/83 mmHg (08/17 0922) Pulse Rate: 99  (08/17 0443)  Labs:  Basename 08/20/11 0508 08/19/11 0330 08/18/11 1758 08/18/11 1757 08/18/11 1010 08/17/11 1800  HGB -- 8.6* 8.4* -- -- --  HCT -- 27.3* 26.2* -- 24.8* --  PLT -- 167 170 -- 171 --  APTT -- -- -- -- -- --  LABPROT 28.1* 26.4* -- 24.4* -- --  INR 2.58* 2.38* -- 2.15* -- --  HEPARINUNFRC -- -- -- -- -- --  CREATININE -- 1.31 -- -- 1.58* 2.03*  CKTOTAL -- -- -- -- -- --  CKMB -- -- -- -- -- --  TROPONINI -- -- -- -- -- --    Estimated Creatinine Clearance: 51.1 ml/min (by C-G formula based on Cr of 1.31).   Medical History: Past Medical History  Diagnosis Date  . Diabetes mellitus   . Atrial fibrillation     Permanent, Coumadin therapy  . HTN (hypertension)   . Chronic diastolic heart failure   . Anemia   . DM2 (diabetes mellitus, type 2)   . Dementia   . Non Hodgkin's lymphoma     B cell, high-grade  . Cancer     Medications:  Scheduled:    . clopidogrel  75 mg Oral Q breakfast  . furosemide  40 mg Oral Daily  . insulin aspart  0-5 Units Subcutaneous QHS  . insulin aspart  0-9 Units Subcutaneous TID WC  . pantoprazole (PROTONIX) IV  40 mg Intravenous Q12H  . polyethylene glycol-electrolytes  4,000 mL Oral Once  . sodium chloride  3 mL Intravenous Q12H   Infusions:    . DISCONTD: sodium chloride     PRN: sodium chloride, ondansetron (ZOFRAN) IV, ondansetron, sodium chloride, DISCONTD: fentaNYL, DISCONTD: midazolam  Assessment:  78 yo AA M on warfarin PTA for A.fib.  Last dose PTA was 8/13.  Coumadin has been on hold this admission for possible GI bleeding.  Per GI  patient has diverticulosis and internal hemorrhoids with no evidence of active bleeding and it is okay to resume anticoagulation at this time    Coumadin dose PTA: 7.5 mg on MWF and 5 mg on TTSS.   Despite last dose of coumadin on 8/13, INR has remained therapeutic on admission - possible secondary to NPO and nausea.  Regular diet ordered 8/17  Will order low dose of coumadin tonight as patient transitions from NPO to eating again and assess INR in AM.    Goal of Therapy:  INR 2-3 Monitor platelets by anticoagulation protocol: Yes   Plan:  1.) Coumadin 4 mg po x 1 tonight at 1800 2.) Daily PT/INR  3.) Monitor for s/sx bleeding and PO intake.   Theodore Virgin, Loma Messing PharmD 10:11 AM 08/20/2011

## 2011-08-20 NOTE — Progress Notes (Signed)
Patient discharged home with  Family, discharge instructions given and explained to patient and wife and they verbalized understanding. Patient denies any pain/distress. Accompanied home by wife.

## 2011-08-20 NOTE — Progress Notes (Signed)
Following the Golytely prep, the patient is having near clear stools. The stools are liquid with "pellet-sized"  formed stools . Will continue to check the consistency of the stools in anticipation of a colonoscopy this am.

## 2011-08-20 NOTE — Progress Notes (Signed)
PAtient back from Endo, alert and oriented, denies any pain/distress. Eat breakfast and tolerated it well, in bed resting. Will continue to assess patient.

## 2011-08-20 NOTE — Discharge Summary (Signed)
Physician Discharge Summary  Jesse Lambert MRN: [id number] DOB/AGE: 1932/06/09 78 y.o.  PCP: Tonye Pearson, MD   Admit date: 08/17/2011 Discharge date: 08/20/2011  Discharge Diagnoses:     *Acute blood loss anemia    NON-HODGKIN'S LYMPHOMA  Atrial fibrillation  Chronic diastolic congestive heart failure  AKI (acute kidney injury)  Diabetes mellitus  GI bleed   Medication List  As of 08/20/2011 11:28 AM   STOP taking these medications         aspirin 81 MG chewable tablet         TAKE these medications         atorvastatin 20 MG tablet   Commonly known as: LIPITOR   Take 10 mg by mouth daily.      bumetanide 1 MG tablet   Commonly known as: BUMEX   Take 2 mg by mouth daily. Prescribed from [place] hospitalization      clopidogrel 75 MG tablet   Commonly known as: PLAVIX   Take 75 mg by mouth daily. Prescribed by [facility] hospitalization      diltiazem 120 MG 24 hr capsule   Commonly known as: CARDIZEM CD   Take 120 mg by mouth daily.      GLIPIZIDE PO   Take 1 tablet by mouth 2 (two) times daily.      lisinopril 5 MG tablet   Commonly known as: PRINIVIL,ZESTRIL   Take 2.5 mg by mouth daily.      multivitamin with minerals Tabs   Take 1 tablet by mouth daily.      pantoprazole 40 MG tablet   Commonly known as: PROTONIX   Take 1 tablet (40 mg total) by mouth 2 (two) times daily.      TOPROL XL 200 MG 24 hr tablet   Generic drug: metoprolol   Take 200 mg by mouth daily. Prescribed at [facility] hopitalization      VITAMIN B-12 CR PO   Take 1 tablet by mouth daily.      warfarin 5 MG tablet   Commonly known as: COUMADIN   Take 5-7.5 mg by mouth daily. 1.5 tab on MWF, 1 tab daily otherwise.            Discharge Condition: Stable   Disposition:    Consults: Gastroenterology      Microbiology: Recent Results (from the past 240 hour(s))  MRSA PCR SCREENING     Status: Normal   Collection Time   08/18/11 12:40 AM      Component Value  Range Status Comment   MRSA by PCR NEGATIVE  NEGATIVE Final      Labs: Results for orders placed during the hospital encounter of 08/17/11 (from the past 48 hour(s))  GLUCOSE, CAPILLARY     Status: Abnormal   Collection Time   08/18/11  4:16 PM      Component Value Range Comment   Glucose-Capillary 100 (*) 70 - 99 mg/dL   PROTIME-INR     Status: Abnormal   Collection Time   08/18/11  5:57 PM      Component Value Range Comment   Prothrombin Time 24.4 (*) 11.6 - 15.2 seconds    INR 2.15 (*) 0.00 - 1.49   CBC     Status: Abnormal   Collection Time   08/18/11  5:58 PM      Component Value Range Comment   WBC 4.5  4.0 - 10.5 K/uL    RBC 2.93 (*) 4.22 - 5.81  MIL/uL    Hemoglobin 8.4 (*) 13.0 - 17.0 g/dL    HCT 96.0 (*) 45.4 - 52.0 %    MCV 89.4  78.0 - 100.0 fL    MCH 28.7  26.0 - 34.0 pg    MCHC 32.1  30.0 - 36.0 g/dL    RDW 09.8 (*) 11.9 - 15.5 %    Platelets 170  150 - 400 K/uL   GLUCOSE, CAPILLARY     Status: Abnormal   Collection Time   08/18/11  9:21 PM      Component Value Range Comment   Glucose-Capillary 120 (*) 70 - 99 mg/dL   CBC     Status: Abnormal   Collection Time   08/19/11  3:30 AM      Component Value Range Comment   WBC 5.6  4.0 - 10.5 K/uL    RBC 3.03 (*) 4.22 - 5.81 MIL/uL    Hemoglobin 8.6 (*) 13.0 - 17.0 g/dL    HCT 14.7 (*) 82.9 - 52.0 %    MCV 90.1  78.0 - 100.0 fL    MCH 28.4  26.0 - 34.0 pg    MCHC 31.5  30.0 - 36.0 g/dL    RDW 56.2 (*) 13.0 - 15.5 %    Platelets 167  150 - 400 K/uL   PROTIME-INR     Status: Abnormal   Collection Time   08/19/11  3:30 AM      Component Value Range Comment   Prothrombin Time 26.4 (*) 11.6 - 15.2 seconds    INR 2.38 (*) 0.00 - 1.49   BASIC METABOLIC PANEL     Status: Abnormal   Collection Time   08/19/11  3:30 AM      Component Value Range Comment   Sodium 142  135 - 145 mEq/L    Potassium 4.0  3.5 - 5.1 mEq/L    Chloride 108  96 - 112 mEq/L    CO2 24  19 - 32 mEq/L    Glucose, Bld 114 (*) 70 - 99 mg/dL     BUN 21  6 - 23 mg/dL    Creatinine, Ser 8.65  0.50 - 1.35 mg/dL    Calcium 8.1 (*) 8.4 - 10.5 mg/dL    GFR calc non Af Amer 51 (*) >90 mL/min    GFR calc Af Amer 59 (*) >90 mL/min   GLUCOSE, CAPILLARY     Status: Abnormal   Collection Time   08/19/11  7:40 AM      Component Value Range Comment   Glucose-Capillary 117 (*) 70 - 99 mg/dL   GLUCOSE, CAPILLARY     Status: Abnormal   Collection Time   08/19/11 12:43 PM      Component Value Range Comment   Glucose-Capillary 145 (*) 70 - 99 mg/dL    Comment 1 Documented in Chart      Comment 2 Notify RN     GLUCOSE, CAPILLARY     Status: Normal   Collection Time   08/19/11  5:17 PM      Component Value Range Comment   Glucose-Capillary 78  70 - 99 mg/dL   GLUCOSE, CAPILLARY     Status: Normal   Collection Time   08/19/11  9:13 PM      Component Value Range Comment   Glucose-Capillary 83  70 - 99 mg/dL   PROTIME-INR     Status: Abnormal   Collection Time   08/20/11  5:08 AM  Component Value Range Comment   Prothrombin Time 28.1 (*) 11.6 - 15.2 seconds    INR 2.58 (*) 0.00 - 1.49   GLUCOSE, CAPILLARY     Status: Abnormal   Collection Time   08/20/11  9:43 AM      Component Value Range Comment   Glucose-Capillary 110 (*) 70 - 99 mg/dL   CBC     Status: Abnormal   Collection Time   08/20/11 10:45 AM      Component Value Range Comment   WBC 5.4  4.0 - 10.5 K/uL    RBC 3.09 (*) 4.22 - 5.81 MIL/uL    Hemoglobin 9.0 (*) 13.0 - 17.0 g/dL    HCT 16.1 (*) 09.6 - 52.0 %    MCV 88.7  78.0 - 100.0 fL    MCH 29.1  26.0 - 34.0 pg    MCHC 32.8  30.0 - 36.0 g/dL    RDW 04.5 (*) 40.9 - 15.5 %    Platelets 150  150 - 400 K/uL      HPI :78 year old [place] American male presented with a chief complaint of dizziness. He states he's been lightheaded. 3 days ago patient noticed he's had dark stools. He was on iron and he stopped taking it. Patient denies any bright red blood in the stools or abdominal pain. Patient states he did vomit times one  but it was the food he had just eaten. Patient had a stent placed in July of 2013 and was placed on Plavix. Patient is also on Coumadin for A. Fib.  In the ER patient was found to have a low hemoglobin and increased creatinine. Hospital service consulted to admit for GI bleed.      HOSPITAL COURSE:  GI bleeding EGD today, follow CBC, status post transfusion of 2 units of packed red blood cells and [facility] gastroenterology was consulted. He had an endoscopy that showed scarring of the prepyloric channel due to previous ulcer disease. No active bleeding was seen. He was recommended to continue with PPI twice a day. Colonoscopy was done. It showed Diverticulosis, internal hemorrhoids. There is no evidence of active bleeding. Followup CBC was stable. Per gastroenterology it is okay to resume anticoagulation and Plavix now. I will be advised the patient to have a weekly CBC. If he experiences any recurrence of bleeding then he will need a capsule endoscopy.  Atrial fibrillation on anticoagulation resume Coumadin.   Coronary artery disease bare-metal stent in the [facility] last month, on Plavix , okay to resume for GI   Discharge Exam:  Blood pressure 114/83, pulse 99, temperature 98.4 F (36.9 C), temperature source Oral, resp. rate 16, height 5\' 9"  (1.753 m), weight 85.2 kg (187 lb 13.3 oz), SpO2 98.00%.  Nose: Nose normal.  Mouth/Throat: Oropharynx is clear and moist.  Eyes: Conjunctivae are normal. Pupils are equal, round, and reactive to light. No scleral icterus.  Neck: Neck supple. No tracheal deviation present.  Cardiovascular: Normal rate, regular rhythm, normal heart sounds and intact distal pulses.  Pulmonary/Chest: Effort normal and breath sounds normal. No respiratory distress.  Abdominal: Soft. Normal appearance and bowel sounds are normal. She exhibits no distension. There is no tenderness.  Musculoskeletal: She exhibits no edema and no tenderness.  Neurological: She is alert. No  cranial nerve deficit.       Discharge Orders    Future Appointments: Provider: Department: Dept Phone: Center:   08/26/2011 9:00 AM [facility] [phone] None   09/01/2011 10:30  AM Krista Blue [facility] [phone] None   09/01/2011 11:00 AM Victorino December, MD [facility] [phone] None     Future Orders Please Complete By Expires   Diet - low sodium heart healthy      Increase activity slowly         Follow-up Information    Schedule an appointment as soon as possible for a visit with DOOLITTLE, Harrel Lemon, MD. (CBC in one week and then biweekly)    Contact information:   [address] [place] [address] [phone]          Signed: Richarda Overlie 08/20/2011, 11:28 AM

## 2011-08-22 ENCOUNTER — Encounter (HOSPITAL_COMMUNITY): Payer: Self-pay | Admitting: Gastroenterology

## 2011-08-22 LAB — GLUCOSE, CAPILLARY

## 2011-08-26 ENCOUNTER — Ambulatory Visit (INDEPENDENT_AMBULATORY_CARE_PROVIDER_SITE_OTHER): Payer: Medicare Other | Admitting: *Deleted

## 2011-08-26 DIAGNOSIS — I4891 Unspecified atrial fibrillation: Secondary | ICD-10-CM

## 2011-08-26 DIAGNOSIS — Z7901 Long term (current) use of anticoagulants: Secondary | ICD-10-CM

## 2011-08-26 LAB — POCT INR: INR: 2.7

## 2011-08-31 ENCOUNTER — Encounter (HOSPITAL_COMMUNITY): Payer: Self-pay | Admitting: Emergency Medicine

## 2011-08-31 ENCOUNTER — Inpatient Hospital Stay (HOSPITAL_COMMUNITY)
Admission: EM | Admit: 2011-08-31 | Discharge: 2011-09-04 | DRG: 811 | Disposition: A | Discharge status: Home / Self Care | Payer: Medicare Other | Attending: Internal Medicine | Admitting: Internal Medicine

## 2011-08-31 DIAGNOSIS — I959 Hypotension, unspecified: Secondary | ICD-10-CM | POA: Diagnosis present

## 2011-08-31 DIAGNOSIS — K922 Gastrointestinal hemorrhage, unspecified: Secondary | ICD-10-CM

## 2011-08-31 DIAGNOSIS — C8589 Other specified types of non-Hodgkin lymphoma, extranodal and solid organ sites: Secondary | ICD-10-CM | POA: Diagnosis present

## 2011-08-31 DIAGNOSIS — R791 Abnormal coagulation profile: Secondary | ICD-10-CM | POA: Diagnosis present

## 2011-08-31 DIAGNOSIS — D649 Anemia, unspecified: Secondary | ICD-10-CM

## 2011-08-31 DIAGNOSIS — I4891 Unspecified atrial fibrillation: Secondary | ICD-10-CM

## 2011-08-31 DIAGNOSIS — D6959 Other secondary thrombocytopenia: Secondary | ICD-10-CM | POA: Diagnosis present

## 2011-08-31 DIAGNOSIS — I5032 Chronic diastolic (congestive) heart failure: Secondary | ICD-10-CM

## 2011-08-31 DIAGNOSIS — D62 Acute posthemorrhagic anemia: Principal | ICD-10-CM

## 2011-08-31 DIAGNOSIS — N179 Acute kidney failure, unspecified: Secondary | ICD-10-CM

## 2011-08-31 DIAGNOSIS — Z9861 Coronary angioplasty status: Secondary | ICD-10-CM

## 2011-08-31 DIAGNOSIS — D696 Thrombocytopenia, unspecified: Secondary | ICD-10-CM | POA: Diagnosis present

## 2011-08-31 DIAGNOSIS — K5731 Diverticulosis of large intestine without perforation or abscess with bleeding: Secondary | ICD-10-CM | POA: Diagnosis present

## 2011-08-31 DIAGNOSIS — T45515A Adverse effect of anticoagulants, initial encounter: Secondary | ICD-10-CM | POA: Diagnosis present

## 2011-08-31 LAB — POCT I-STAT, CHEM 8
BUN: 43 mg/dL — ABNORMAL HIGH (ref 6–23)
Chloride: 107 mEq/L (ref 96–112)
Creatinine, Ser: 1.7 mg/dL — ABNORMAL HIGH (ref 0.50–1.35)
Glucose, Bld: 150 mg/dL — ABNORMAL HIGH (ref 70–99)
Hemoglobin: 7.8 g/dL — ABNORMAL LOW (ref 13.0–17.0)
Potassium: 4 mEq/L (ref 3.5–5.1)
Sodium: 143 mEq/L (ref 135–145)

## 2011-08-31 LAB — CBC WITH DIFFERENTIAL/PLATELET
Basophils Absolute: 0 10*3/uL (ref 0.0–0.1)
Basophils Relative: 0 % (ref 0–1)
Eosinophils Absolute: 0.2 10*3/uL (ref 0.0–0.7)
HCT: 23.1 % — ABNORMAL LOW (ref 39.0–52.0)
Hemoglobin: 7.3 g/dL — ABNORMAL LOW (ref 13.0–17.0)
MCH: 26.6 pg (ref 26.0–34.0)
MCHC: 31.6 g/dL (ref 30.0–36.0)
Monocytes Absolute: 0.8 10*3/uL (ref 0.1–1.0)
Monocytes Relative: 8 % (ref 3–12)
Neutro Abs: 5.6 10*3/uL (ref 1.7–7.7)
Neutrophils Relative %: 56 % (ref 43–77)
RDW: 16.9 % — ABNORMAL HIGH (ref 11.5–15.5)

## 2011-08-31 LAB — PREPARE RBC (CROSSMATCH)

## 2011-08-31 LAB — PROTIME-INR: Prothrombin Time: 34.4 seconds — ABNORMAL HIGH (ref 11.6–15.2)

## 2011-08-31 LAB — SAMPLE TO BLOOD BANK

## 2011-08-31 MED ORDER — VITAMIN K1 10 MG/ML IJ SOLN
5.0000 mg | Freq: Once | INTRAVENOUS | Status: AC
  Administered 2011-08-31: 5 mg via INTRAVENOUS
  Filled 2011-08-31: qty 0.5

## 2011-08-31 NOTE — H&P (Addendum)
[facility] History and Physical  ROD MAJERUS ZOX:[id number] DOB: 1932-10-25 DOA: 08/31/2011  Referring physician: ER physician PCP: Tonye Pearson, MD   Chief Complaint: rectal bleed  HPI:  78 year old male with non-hodgkin lymphoma (06/2008; status post radiation to the thoracic spine and adjacent areas of involvement from 06/17/08 - 07/15/08 and CHOP_R x 6 cycles 08/07/08 - 11/12/08), atrial fibrillation on coumadin, bleeding diverticulosis who presented with rectal bleed started today morning prior to the admission. Patient reported having a significant amount of rectal bleed filling whole toilet bowl. He also reports he is still bleeding per rectum. Patient reports feeling dizzy but no passing out. No complaints of abdominal pain, no nausea or vomiting, no constipation or diarrhea. No complaints of chest pain, no shortness of breath, no cough, no fever or chills. Patient reported having similar episodes in past. Patient did have colonoscopy and EGD 08/18/2011 with findings of diverticulosis but per GI recommendation it was ok to continue with anticoagulation.  Assessment and Plan:  Principal Problem:  *Acute blood loss anemia in the setting of supratherapeutic INR - likely related to diverticulosis in the setting of supratherapeutic INR due to coumadin - would give one dose of vitamin k due to active bleed - hemoglobin 7.8 on admission; hemodynamically stable - we will transfuse 2 units PRBCs - follow up post transfusion hemoglobin  Active Problems:  NON-HODGKIN'S LYMPHOMA - on observation per oncology note 03/2011   HYPERTENSION, UNSPECIFIED - continue metoprolol and Cardizem   Atrial fibrillation - rate controlled - continue Cardizem and metoprolol provided BP allows - stop coumadin   Chronic diastolic congestive heart failure - on previous admissions BNP around 2,000 - gentle IV hydration, perhaps d/c in am - continue metoprolol and Cardizem    AKI (acute  kidney injury) - likely due to acute blood loss anemia versus lisinopril - hold lisinopril - gentle IV fluid hydration - follow up BMP in am  DVT prophylaxis - SCD  Code Status: Full Family Communication: Pt at bedside Disposition Plan: Admit for further evaluation  Manson Passey, MD  [facility] Pager [phone]  If 7PM-7AM, please contact night-coverage [email or web address] Password [email or web address] 08/31/2011, 10:36 PM   Review of Systems:  Constitutional: Negative for fever, chills and malaise/fatigue. Negative for diaphoresis.  HENT: Negative for hearing loss, ear pain, nosebleeds, congestion, sore throat, neck pain, tinnitus and ear discharge.   Eyes: Negative for blurred vision, double vision, photophobia, pain, discharge and redness.  Respiratory: Negative for cough, hemoptysis, sputum production, shortness of breath, wheezing and stridor.   Cardiovascular: Negative for chest pain, palpitations, orthopnea, claudication and leg swelling.  Gastrointestinal: per hpi.  Genitourinary: Negative for dysuria, urgency, frequency, hematuria and flank pain.  Musculoskeletal: Negative for myalgias, back pain, joint pain and falls.  Skin: Negative for itching and rash.  Neurological: Negative for dizziness and weakness. Negative for tingling, tremors, sensory change, speech change, focal weakness, loss of consciousness and headaches.  Endo/Heme/Allergies: Negative for environmental allergies and polydipsia. Does not bruise/bleed easily.  Psychiatric/Behavioral: Negative for suicidal ideas. The patient is not nervous/anxious.      Past Medical History  Diagnosis Date  . Diabetes mellitus   . Atrial fibrillation     Permanent, Coumadin therapy  . HTN (hypertension)   . Chronic diastolic heart failure   . Anemia   . DM2 (diabetes mellitus, type 2)   . Dementia   . Non Hodgkin's lymphoma     B cell, high-grade  . Cancer  Past Surgical History  Procedure Date  . No past  surgeries   . Esophagogastroduodenoscopy 08/18/2011    Procedure: ESOPHAGOGASTRODUODENOSCOPY (EGD);  Surgeon: Shirley Friar, MD;  Location: [facility];  Service: Endoscopy;  Laterality: N/A;  . Colonoscopy 08/20/2011    Procedure: COLONOSCOPY;  Surgeon: Graylin Shiver, MD;  Location: [facility];  Service: Endoscopy;  Laterality: N/A;   Social History:  reports that he quit smoking about 41 years ago. His smoking use included Cigarettes. He has never used smokeless tobacco. He reports that he does not drink alcohol or use illicit drugs.  No Known Allergies  Family History: HTN in parents  Prior to Admission medications   Medication Sig Start Date End Date Taking? Authorizing Provider  atorvastatin (LIPITOR) 20 MG tablet Take 10 mg by mouth daily.   Yes Historical Provider, MD  bumetanide (BUMEX) 1 MG tablet Take 2 mg by mouth daily. Prescribed from [place] hospitalization 07/24/11  Yes Historical Provider, MD  Cyanocobalamin (VITAMIN B-12 CR PO) Take 1 tablet by mouth daily.    Yes Historical Provider, MD  diltiazem (CARDIZEM CD) 120 MG 24 hr capsule Take 120 mg by mouth daily.   Yes Historical Provider, MD  ferrous sulfate 325 (65 FE) MG tablet Take 325 mg by mouth 2 (two) times daily.   Yes Historical Provider, MD  lisinopril (PRINIVIL,ZESTRIL) 5 MG tablet Take 2.5 mg by mouth daily.   Yes Historical Provider, MD  metoprolol succinate (TOPROL-XL) 100 MG 24 hr tablet Take 200 mg by mouth daily. Take two tablets Take with or immediately following a meal.   Yes Historical Provider, MD  Multiple Vitamin (MULTIVITAMIN WITH MINERALS) TABS Take 1 tablet by mouth daily.   Yes Historical Provider, MD  pantoprazole (PROTONIX) 40 MG tablet Take 1 tablet (40 mg total) by mouth 2 (two) times daily. 08/20/11 08/19/12 Yes Richarda Overlie, MD  vitamin E 100 UNIT capsule Take 100 Units by mouth daily.   Yes Historical Provider, MD  warfarin (COUMADIN) 5 MG tablet Take 5-7.5 mg by mouth daily. 1.5 tab on MWF, 1  tab daily otherwise.   Yes Historical Provider, MD   Physical Exam: Filed Vitals:   08/31/11 2106 08/31/11 2149 08/31/11 2150 08/31/11 2156  BP: 101/48 105/54 108/55 107/50  Pulse:  62 67 61  Temp:      TempSrc:      Resp: 16     SpO2:        Physical Exam  Constitutional: Appears well-developed and well-nourished. No distress.  HENT: Normocephalic. External right and left ear normal. Oropharynx is clear and moist.  Eyes: Conjunctivae and EOM are normal. PERRLA, no scleral icterus.  Neck: Normal ROM. Neck supple. No JVD. No tracheal deviation. No thyromegaly.  CVS: irregular rhythm, rate controlled, S1/S2 + Pulmonary: Effort and breath sounds normal, no stridor, rhonchi, wheezes, rales.  Abdominal: Soft. BS +,  no distension, tenderness, rebound or guarding.  Musculoskeletal: Normal range of motion. No edema and no tenderness.  Lymphadenopathy: No lymphadenopathy noted, cervical, inguinal. Neuro: Alert. Normal reflexes, muscle tone coordination. No cranial nerve deficit. Skin: Skin is warm and dry. No rash noted. Not diaphoretic. No erythema. No pallor.  Psychiatric: Normal mood and affect. Behavior, judgment, thought content normal.   Labs on Admission:  Basic Metabolic Panel:  Lab 08/31/11 5621  NA 143  K 4.0  CL 107  CO2 --  GLUCOSE 150*  BUN 43*  CREATININE 1.70*  CALCIUM --   CBC:  Lab 08/31/11 2118  08/31/11 2105  WBC -- 9.9  NEUTROABS -- 5.6  HGB 7.8* 7.3*  HCT 23.0* 23.1*  MCV -- 84.3  PLT -- 167   EKG: atrial fibrillation

## 2011-08-31 NOTE — ED Provider Notes (Signed)
History     CSN: [id number]  Arrival date & time 08/31/11  2032   First MD Initiated Contact with Patient 08/31/11 2104      Chief Complaint  Patient presents with  . Hypotension    (Consider location/radiation/quality/duration/timing/severity/associated sxs/prior treatment) The history is provided by the patient.  Jesse Lambert is a 78 y.o. male hx of afib on coumadin, DM, HTN, nonhodgkin's lymphoma here with melena. Two episodes of melena today. In the afternoon, he started feeling lightheaded and dizzy and almost passed out. No chest pain or SOB or abdominal pain. Had similar episode a week ago and was admitted and transfused. He had endoscopy and colonoscopy that time and showed diverticulosis and no stomach ulcers. No fever or cough.    Past Medical History  Diagnosis Date  . Diabetes mellitus   . Atrial fibrillation     Permanent, Coumadin therapy  . HTN (hypertension)   . Chronic diastolic heart failure   . Anemia   . DM2 (diabetes mellitus, type 2)   . Dementia   . Non Hodgkin's lymphoma     B cell, high-grade  . Cancer     Past Surgical History  Procedure Date  . No past surgeries   . Esophagogastroduodenoscopy 08/18/2011    Procedure: ESOPHAGOGASTRODUODENOSCOPY (EGD);  Surgeon: Shirley Friar, MD;  Location: [facility];  Service: Endoscopy;  Laterality: N/A;  . Colonoscopy 08/20/2011    Procedure: COLONOSCOPY;  Surgeon: Graylin Shiver, MD;  Location: [facility];  Service: Endoscopy;  Laterality: N/A;    Family History  Problem Relation Age of Onset  . Heart attack Brother     and sister  . Diabetes Other     famiyl hx    History  Substance Use Topics  . Smoking status: Former Smoker    Types: Cigarettes    Quit date: 07/28/1970  . Smokeless tobacco: Never Used  . Alcohol Use: No      Review of Systems  Gastrointestinal: Positive for blood in stool.  Neurological: Positive for dizziness and light-headedness.  All other systems  reviewed and are negative.    Allergies  Review of patient's allergies indicates no known allergies.  Home Medications   Current Outpatient Rx  Name Route Sig Dispense Refill  . ATORVASTATIN CALCIUM 20 MG PO TABS Oral Take 10 mg by mouth daily.    . BUMETANIDE 1 MG PO TABS Oral Take 2 mg by mouth daily. Prescribed from [place] hospitalization    . VITAMIN B-12 CR PO Oral Take 1 tablet by mouth daily.     Marland Kitchen DILTIAZEM HCL ER COATED BEADS 120 MG PO CP24 Oral Take 120 mg by mouth daily.    Marland Kitchen FERROUS SULFATE 325 (65 FE) MG PO TABS Oral Take 325 mg by mouth 2 (two) times daily.    Marland Kitchen LISINOPRIL 5 MG PO TABS Oral Take 2.5 mg by mouth daily.    Marland Kitchen METOPROLOL SUCCINATE ER 100 MG PO TB24 Oral Take 200 mg by mouth daily. Take two tablets Take with or immediately following a meal.    . ADULT MULTIVITAMIN W/MINERALS CH Oral Take 1 tablet by mouth daily.    Marland Kitchen PANTOPRAZOLE SODIUM 40 MG PO TBEC Oral Take 1 tablet (40 mg total) by mouth 2 (two) times daily. 60 tablet 2  . VITAMIN E 100 UNITS PO CAPS Oral Take 100 Units by mouth daily.    . WARFARIN SODIUM 5 MG PO TABS Oral Take 5-7.5 mg by mouth daily.  1.5 tab on MWF, 1 tab daily otherwise.      BP 107/50  Pulse 61  Temp 98.7 F (37.1 C) (Oral)  Resp 16  SpO2 98%  Physical Exam  Nursing note and vitals reviewed. Constitutional: He is oriented to person, place, and time.       Tired, chronically ill  HENT:  Head: Normocephalic.  Mouth/Throat: Oropharynx is clear and moist.  Eyes: Pupils are equal, round, and reactive to light.       Conjuntiva slightly pale  Neck: Normal range of motion. Neck supple.  Cardiovascular: Normal rate, regular rhythm and normal heart sounds.   Pulmonary/Chest: Effort normal and breath sounds normal.  Abdominal: Soft. Bowel sounds are normal.       Rectal- no external hemorrhoids, + melena  Musculoskeletal: Normal range of motion.  Neurological: He is alert and oriented to person, place, and time.  Skin: Skin is warm  and dry.  Psychiatric: He has a normal mood and affect. His behavior is normal. Judgment and thought content normal.    ED Course  Procedures (including critical care time)  Labs Reviewed  PROTIME-INR - Abnormal; Notable for the following:    Prothrombin Time 34.4 (*)     INR 3.34 (*)     All other components within normal limits  CBC WITH DIFFERENTIAL - Abnormal; Notable for the following:    RBC 2.74 (*)     Hemoglobin 7.3 (*)     HCT 23.1 (*)     RDW 16.9 (*)     All other components within normal limits  POCT I-STAT, CHEM 8 - Abnormal; Notable for the following:    BUN 43 (*)     Creatinine, Ser 1.70 (*)     Glucose, Bld 150 (*)     Hemoglobin 7.8 (*)     HCT 23.0 (*)     All other components within normal limits  SAMPLE TO BLOOD BANK  TYPE AND SCREEN  OCCULT BLOOD, POC DEVICE  PREPARE RBC (CROSSMATCH)   No results found.   1. GI bleed   2. Anemia     Date: 08/31/2011  Rate: 59  Rhythm: atrial fibrillation  QRS Axis: normal  Intervals: normal  ST/T Wave abnormalities: TWI V4-V6 and inferiorly  Conduction Disutrbances:none  Narrative Interpretation:   Old EKG Reviewed: unchanged     MDM  Jesse Lambert is a 78 y.o. male hx of afib on coumadin, diverticulosis here with melena and near syncope. Will check CBC, BMP. Will also get EKG.   10:15 PM Patient's Hg 7.3 (last time 9 at discharge), BUN/Cr slightly elevated. 2 U PRBC ordered. Will admit to step down. Discussed with [facility], Dr. Elisabeth Pigeon.         Richardean Canal, MD 08/31/11 2216

## 2011-08-31 NOTE — ED Notes (Signed)
Pt has hx of GI bleed

## 2011-09-01 ENCOUNTER — Inpatient Hospital Stay (HOSPITAL_COMMUNITY): Payer: Medicare Other

## 2011-09-01 ENCOUNTER — Encounter (HOSPITAL_COMMUNITY)
Admission: EM | Disposition: A | Discharge status: Home / Self Care | Payer: Self-pay | Source: Home / Self Care | Attending: Internal Medicine

## 2011-09-01 ENCOUNTER — Ambulatory Visit: Payer: Non-veteran care | Admitting: Oncology

## 2011-09-01 ENCOUNTER — Other Ambulatory Visit: Payer: Non-veteran care | Admitting: Lab

## 2011-09-01 ENCOUNTER — Encounter (HOSPITAL_COMMUNITY): Payer: Self-pay

## 2011-09-01 LAB — COMPREHENSIVE METABOLIC PANEL
ALT: 12 U/L (ref 0–53)
ALT: 11 U/L (ref 0–53)
AST: 16 U/L (ref 0–37)
Alkaline Phosphatase: 67 U/L (ref 39–117)
Alkaline Phosphatase: 58 U/L (ref 39–117)
CO2: 26 mEq/L (ref 19–32)
CO2: 25 mEq/L (ref 19–32)
Calcium: 8.3 mg/dL — ABNORMAL LOW (ref 8.4–10.5)
Chloride: 109 mEq/L (ref 96–112)
GFR calc Af Amer: 49 mL/min — ABNORMAL LOW (ref >90)
GFR calc non Af Amer: 40 mL/min — ABNORMAL LOW (ref >90)
GFR calc non Af Amer: 42 mL/min — ABNORMAL LOW (ref >90)
Glucose, Bld: 103 mg/dL — ABNORMAL HIGH (ref 70–99)
Potassium: 4 mEq/L (ref 3.5–5.1)
Potassium: 4.1 mEq/L (ref 3.5–5.1)
Sodium: 140 mEq/L (ref 135–145)
Sodium: 141 mEq/L (ref 135–145)
Total Protein: 5.6 g/dL — ABNORMAL LOW (ref 6.0–8.3)

## 2011-09-01 LAB — PROTIME-INR
INR: 2.01 — ABNORMAL HIGH (ref 0.00–1.49)
Prothrombin Time: 23.1 seconds — ABNORMAL HIGH (ref 11.6–15.2)

## 2011-09-01 LAB — DIFFERENTIAL
Eosinophils Relative: 2 % (ref 0–5)
Lymphocytes Relative: 27 % (ref 12–46)
Lymphs Abs: 1.9 10*3/uL (ref 0.7–4.0)
Monocytes Absolute: 0.5 10*3/uL (ref 0.1–1.0)

## 2011-09-01 LAB — GLUCOSE, CAPILLARY

## 2011-09-01 LAB — CBC
HCT: 17.7 % — ABNORMAL LOW (ref 39.0–52.0)
HCT: 21.3 % — ABNORMAL LOW (ref 39.0–52.0)
MCHC: 34.3 g/dL (ref 30.0–36.0)
MCV: 82.7 fL (ref 78.0–100.0)
RBC: 2.14 MIL/uL — ABNORMAL LOW (ref 4.22–5.81)
RDW: 16 % — ABNORMAL HIGH (ref 11.5–15.5)
WBC: 6.9 10*3/uL (ref 4.0–10.5)

## 2011-09-01 SURGERY — IMAGING PROCEDURE, GI TRACT, INTRALUMINAL, VIA CAPSULE
Anesthesia: LOCAL

## 2011-09-01 MED ORDER — SODIUM CHLORIDE 0.9 % IJ SOLN
3.0000 mL | Freq: Two times a day (BID) | INTRAMUSCULAR | Status: DC
  Administered 2011-09-01 – 2011-09-02 (×4): 3 mL via INTRAVENOUS
  Administered 2011-09-03: 11:00:00 via INTRAVENOUS
  Administered 2011-09-03 – 2011-09-04 (×2): 3 mL via INTRAVENOUS

## 2011-09-01 MED ORDER — ACETAMINOPHEN 325 MG PO TABS: 650.0000 mg | ORAL_TABLET | Freq: Four times a day (QID) | ORAL | Status: DC | PRN

## 2011-09-01 MED ORDER — DILTIAZEM HCL ER COATED BEADS 120 MG PO CP24
120.0000 mg | ORAL_CAPSULE | Freq: Every day | ORAL | Status: DC
  Administered 2011-09-01 – 2011-09-04 (×4): 120 mg via ORAL
  Filled 2011-09-01 (×4): qty 1

## 2011-09-01 MED ORDER — TECHNETIUM TC 99M-LABELED RED BLOOD CELLS IV KIT
22.0000 | PACK | Freq: Once | INTRAVENOUS | Status: AC | PRN
  Administered 2011-09-01: 22 via INTRAVENOUS

## 2011-09-01 MED ORDER — ONDANSETRON HCL 4 MG/2ML IJ SOLN: 4.0000 mg | Freq: Four times a day (QID) | INTRAMUSCULAR | Status: DC | PRN

## 2011-09-01 MED ORDER — MORPHINE SULFATE 2 MG/ML IJ SOLN: 1.0000 mg | INTRAMUSCULAR | Status: DC | PRN

## 2011-09-01 MED ORDER — SODIUM CHLORIDE 0.9 % IV SOLN
INTRAVENOUS | Status: DC
  Administered 2011-09-01: 50 mL/h via INTRAVENOUS
  Administered 2011-09-02: 16:00:00 via INTRAVENOUS

## 2011-09-01 MED ORDER — ACETAMINOPHEN 650 MG RE SUPP: 650.0000 mg | Freq: Four times a day (QID) | RECTAL | Status: DC | PRN

## 2011-09-01 MED ORDER — VITAMIN E 45 MG (100 UNIT) PO CAPS
100.0000 [IU] | ORAL_CAPSULE | Freq: Every day | ORAL | Status: DC
  Administered 2011-09-01 – 2011-09-02 (×2): 100 [IU] via ORAL
  Filled 2011-09-01 (×2): qty 1

## 2011-09-01 MED ORDER — BIOTENE DRY MOUTH MT LIQD
15.0000 mL | Freq: Two times a day (BID) | OROMUCOSAL | Status: DC
Start: 2011-09-02 — End: 2011-09-04
  Administered 2011-09-02 – 2011-09-04 (×2): 15 mL via OROMUCOSAL

## 2011-09-01 MED ORDER — ADULT MULTIVITAMIN W/MINERALS CH
1.0000 | ORAL_TABLET | Freq: Every day | ORAL | Status: DC
  Administered 2011-09-01 – 2011-09-04 (×4): 1 via ORAL
  Filled 2011-09-01 (×4): qty 1

## 2011-09-01 MED ORDER — ATORVASTATIN CALCIUM 10 MG PO TABS
10.0000 mg | ORAL_TABLET | Freq: Every day | ORAL | Status: DC
Start: 2011-09-01 — End: 2011-09-04
  Administered 2011-09-01 – 2011-09-03 (×3): 10 mg via ORAL
  Filled 2011-09-01 (×4): qty 1

## 2011-09-01 MED ORDER — FERROUS SULFATE 325 (65 FE) MG PO TABS
325.0000 mg | ORAL_TABLET | Freq: Two times a day (BID) | ORAL | Status: DC
  Filled 2011-09-01 (×3): qty 1

## 2011-09-01 MED ORDER — METOPROLOL SUCCINATE ER 100 MG PO TB24
200.0000 mg | ORAL_TABLET | Freq: Every day | ORAL | Status: DC
  Administered 2011-09-01 – 2011-09-04 (×4): 200 mg via ORAL
  Filled 2011-09-01 (×4): qty 2

## 2011-09-01 MED ORDER — BUMETANIDE 2 MG PO TABS
2.0000 mg | ORAL_TABLET | Freq: Every day | ORAL | Status: DC
  Filled 2011-09-01: qty 1

## 2011-09-01 MED ORDER — HYDROCODONE-ACETAMINOPHEN 5-325 MG PO TABS
1.0000 | ORAL_TABLET | ORAL | Status: DC | PRN
  Filled 2011-09-01: qty 1

## 2011-09-01 MED ORDER — SENNOSIDES-DOCUSATE SODIUM 8.6-50 MG PO TABS: 1.0000 | ORAL_TABLET | Freq: Every evening | ORAL | Status: DC | PRN

## 2011-09-01 MED ORDER — ONDANSETRON HCL 4 MG PO TABS: 4.0000 mg | ORAL_TABLET | Freq: Four times a day (QID) | ORAL | Status: DC | PRN

## 2011-09-01 MED ORDER — POLYETHYLENE GLYCOL 3350 17 G PO PACK
17.0000 g | PACK | Freq: Two times a day (BID) | ORAL | Status: DC
  Administered 2011-09-01 – 2011-09-04 (×4): 17 g via ORAL
  Filled 2011-09-01 (×7): qty 1

## 2011-09-01 MED ORDER — PANTOPRAZOLE SODIUM 40 MG IV SOLR
40.0000 mg | Freq: Two times a day (BID) | INTRAVENOUS | Status: DC
  Administered 2011-09-01 – 2011-09-04 (×8): 40 mg via INTRAVENOUS
  Filled 2011-09-01 (×10): qty 40

## 2011-09-01 NOTE — Consult Note (Signed)
[facility] CONSULT Reason for consult: Recurrent GI bleeding Referring Physician: [facility]  Jesse Lambert is an 78 y.o. male.  HPI: Pleasant gentleman who was hospitalized 8/14 8/17 with GI bleed. He has atrial fibrillation has been on Coumadin. She received transfusion. He was seen by our service and had EGD revealing prepyloric scarring felt to be due to previous ulcer disease without active bleeding and colonoscopy showing no active bleeding internal hemorrhoids and diverticulosis. The patient was discharged on anticoagulation and states that he did quite well for for 5 days and then began to pass dark stool. In the past he had been on aspirin and Plavix because of previous stent as well as Coumadin for atrial fibrillation he was discharged on Coumadin. He states that he always tends little toward constipation but is not any worse than usual. He goes to [facility] cardiology to the Coumadin clinic. Unable to tell me the name of the doctor. He had bleeding scan done today after he presented with recurrent bleeding. His hemoglobin dropped to 7.6 and INR is 3.68. Coumadin is on hold. The bleeding scan showed active bleeding in the left side of his abdomen initial report was small bowel bleeding. We were going to do a small bowel capsule. His scan did not show any blood in the cecum. I have reviewed this with several radiologist in conjunction with his recent CT scan showing a location of a redundant loop of sigmoid colon and their feeling is that this is clearly a bleed in the sigmoid colon or at the descending colon. The patient has not passed any more blood since his admission.  Past Medical History  Diagnosis Date  . Diabetes mellitus   . Atrial fibrillation     Permanent, Coumadin therapy  . HTN (hypertension)   . Chronic diastolic heart failure   . Anemia   . DM2 (diabetes mellitus, type 2)   . Dementia   . Non Hodgkin's lymphoma     B cell, high-grade  . Cancer      Past Surgical History  Procedure Date  . No past surgeries   . Esophagogastroduodenoscopy 08/18/2011    Procedure: ESOPHAGOGASTRODUODENOSCOPY (EGD);  Surgeon: Shirley Friar, MD;  Location: [facility];  Service: Endoscopy;  Laterality: N/A;  . Colonoscopy 08/20/2011    Procedure: COLONOSCOPY;  Surgeon: Graylin Shiver, MD;  Location: [facility];  Service: Endoscopy;  Laterality: N/A;    Family History  Problem Relation Age of Onset  . Heart attack Brother     and sister  . Diabetes Other     famiyl hx    Social History:  reports that he quit smoking about 41 years ago. His smoking use included Cigarettes. He has never used smokeless tobacco. He reports that he does not drink alcohol or use illicit drugs.  Allergies: No Known Allergies  Medications;    . antiseptic oral rinse  15 mL Mouth Rinse BID  . atorvastatin  10 mg Oral Daily  . diltiazem  120 mg Oral Daily  . metoprolol succinate  200 mg Oral Daily  . multivitamin with minerals  1 tablet Oral Daily  . pantoprazole (PROTONIX) IV  40 mg Intravenous Q12H  . phytonadione (VITAMIN K) IV  5 mg Intravenous Once  . sodium chloride  3 mL Intravenous Q12H  . vitamin E  100 Units Oral Daily  . DISCONTD: bumetanide  2 mg Oral Daily  . DISCONTD: ferrous sulfate  325 mg Oral BID   PRN Meds  acetaminophen, acetaminophen, HYDROcodone-acetaminophen, morphine injection, ondansetron (ZOFRAN) IV, ondansetron, senna-docusate, technetium labeled red blood cells Results for orders placed during the hospital encounter of 08/31/11 (from the past 48 hour(s))  PROTIME-INR     Status: Abnormal   Collection Time   08/31/11  9:05 PM      Component Value Range Comment   Prothrombin Time 34.4 (*) 11.6 - 15.2 seconds    INR 3.34 (*) 0.00 - 1.49   CBC WITH DIFFERENTIAL     Status: Abnormal   Collection Time   08/31/11  9:05 PM      Component Value Range Comment   WBC 9.9  4.0 - 10.5 K/uL    RBC 2.74 (*) 4.22 - 5.81 MIL/uL    Hemoglobin  7.3 (*) 13.0 - 17.0 g/dL    HCT 81.1 (*) 91.4 - 52.0 %    MCV 84.3  78.0 - 100.0 fL    MCH 26.6  26.0 - 34.0 pg    MCHC 31.6  30.0 - 36.0 g/dL    RDW 78.2 (*) 95.6 - 15.5 %    Platelets 167  150 - 400 K/uL    Neutrophils Relative 56  43 - 77 %    Neutro Abs 5.6  1.7 - 7.7 K/uL    Lymphocytes Relative 33  12 - 46 %    Lymphs Abs 3.3  0.7 - 4.0 K/uL    Monocytes Relative 8  3 - 12 %    Monocytes Absolute 0.8  0.1 - 1.0 K/uL    Eosinophils Relative 2  0 - 5 %    Eosinophils Absolute 0.2  0.0 - 0.7 K/uL    Basophils Relative 0  0 - 1 %    Basophils Absolute 0.0  0.0 - 0.1 K/uL   SAMPLE TO BLOOD BANK     Status: Normal   Collection Time   08/31/11  9:11 PM      Component Value Range Comment   Blood Bank Specimen SAMPLE AVAILABLE FOR TESTING      Sample Expiration 09/03/2011     TYPE AND SCREEN     Status: Normal (Preliminary result)   Collection Time   08/31/11  9:15 PM      Component Value Range Comment   ABO/RH(D) O NEG      Antibody Screen NEG      Sample Expiration 09/03/2011      Unit Number [id number]      Blood Component Type RED CELLS,LR      Unit division 00      Status of Unit ISSUED      Transfusion Status OK TO TRANSFUSE      Crossmatch Result Compatible      Unit Number [id number]      Blood Component Type RED CELLS,LR      Unit division 00      Status of Unit ISSUED      Transfusion Status OK TO TRANSFUSE      Crossmatch Result Compatible     POCT I-STAT, CHEM 8     Status: Abnormal   Collection Time   08/31/11  9:18 PM      Component Value Range Comment   Sodium 143  135 - 145 mEq/L    Potassium 4.0  3.5 - 5.1 mEq/L    Chloride 107  96 - 112 mEq/L    BUN 43 (*) 6 - 23 mg/dL    Creatinine, Ser 0.27 (*) 0.50 - 1.35 mg/dL  Glucose, Bld 150 (*) 70 - 99 mg/dL    Calcium, Ion 9.60  4.54 - 1.30 mmol/L    TCO2 22  0 - 100 mmol/L    Hemoglobin 7.8 (*) 13.0 - 17.0 g/dL    HCT 09.8 (*) 11.9 - 52.0 %   OCCULT BLOOD, POC DEVICE     Status: Normal    Collection Time   08/31/11  9:22 PM      Component Value Range Comment   Fecal Occult Bld POSITIVE     PREPARE RBC (CROSSMATCH)     Status: Normal   Collection Time   08/31/11 10:30 PM      Component Value Range Comment   Order Confirmation ORDER PROCESSED BY BLOOD BANK     COMPREHENSIVE METABOLIC PANEL     Status: Abnormal   Collection Time   09/01/11 12:25 AM      Component Value Range Comment   Sodium 140  135 - 145 mEq/L    Potassium 4.0  3.5 - 5.1 mEq/L    Chloride 107  96 - 112 mEq/L    CO2 26  19 - 32 mEq/L    Glucose, Bld 115 (*) 70 - 99 mg/dL    BUN 43 (*) 6 - 23 mg/dL    Creatinine, Ser 1.47 (*) 0.50 - 1.35 mg/dL    Calcium 8.3 (*) 8.4 - 10.5 mg/dL    Total Protein 5.6 (*) 6.0 - 8.3 g/dL    Albumin 2.7 (*) 3.5 - 5.2 g/dL    AST 16  0 - 37 U/L    ALT 12  0 - 53 U/L    Alkaline Phosphatase 67  39 - 117 U/L    Total Bilirubin 0.3  0.3 - 1.2 mg/dL    GFR calc non Af Amer 40 (*) >90 mL/min    GFR calc Af Amer 46 (*) >90 mL/min   PHOSPHORUS     Status: Normal   Collection Time   09/01/11 12:25 AM      Component Value Range Comment   Phosphorus 4.0  2.3 - 4.6 mg/dL   MAGNESIUM     Status: Normal   Collection Time   09/01/11 12:25 AM      Component Value Range Comment   Magnesium 2.1  1.5 - 2.5 mg/dL   CBC     Status: Abnormal   Collection Time   09/01/11 12:25 AM      Component Value Range Comment   WBC 6.9  4.0 - 10.5 K/uL    RBC 2.14 (*) 4.22 - 5.81 MIL/uL    Hemoglobin 5.8 (*) 13.0 - 17.0 g/dL    HCT 82.9 (*) 56.2 - 52.0 %    MCV 82.7  78.0 - 100.0 fL    MCH 26.6  26.0 - 34.0 pg    MCHC 32.2  30.0 - 36.0 g/dL    RDW 13.0 (*) 86.5 - 15.5 %    Platelets 127 (*) 150 - 400 K/uL   DIFFERENTIAL     Status: Normal   Collection Time   09/01/11 12:25 AM      Component Value Range Comment   Neutrophils Relative 64  43 - 77 %    Neutro Abs 4.5  1.7 - 7.7 K/uL    Lymphocytes Relative 27  12 - 46 %    Lymphs Abs 1.9  0.7 - 4.0 K/uL    Monocytes Relative 7  3 - 12 %     Monocytes Absolute  0.5  0.1 - 1.0 K/uL    Eosinophils Relative 2  0 - 5 %    Eosinophils Absolute 0.1  0.0 - 0.7 K/uL    Basophils Relative 0  0 - 1 %    Basophils Absolute 0.0  0.0 - 0.1 K/uL   APTT     Status: Abnormal   Collection Time   09/01/11 12:25 AM      Component Value Range Comment   aPTT 72 (*) 24 - 37 seconds   PROTIME-INR     Status: Abnormal   Collection Time   09/01/11 12:25 AM      Component Value Range Comment   Prothrombin Time 37.1 (*) 11.6 - 15.2 seconds    INR 3.68 (*) 0.00 - 1.49   GLUCOSE, CAPILLARY     Status: Normal   Collection Time   09/01/11  7:42 AM      Component Value Range Comment   Glucose-Capillary 83  70 - 99 mg/dL    Comment 1 Documented in Chart      Comment 2 Notify RN     COMPREHENSIVE METABOLIC PANEL     Status: Abnormal   Collection Time   09/01/11  2:50 PM      Component Value Range Comment   Sodium 141  135 - 145 mEq/L    Potassium 4.1  3.5 - 5.1 mEq/L    Chloride 109  96 - 112 mEq/L    CO2 25  19 - 32 mEq/L    Glucose, Bld 103 (*) 70 - 99 mg/dL    BUN 42 (*) 6 - 23 mg/dL    Creatinine, Ser 9.14 (*) 0.50 - 1.35 mg/dL    Calcium 8.1 (*) 8.4 - 10.5 mg/dL    Total Protein 5.6 (*) 6.0 - 8.3 g/dL    Albumin 2.7 (*) 3.5 - 5.2 g/dL    AST 16  0 - 37 U/L    ALT 11  0 - 53 U/L    Alkaline Phosphatase 58  39 - 117 U/L    Total Bilirubin 0.5  0.3 - 1.2 mg/dL    GFR calc non Af Amer 42 (*) >90 mL/min    GFR calc Af Amer 49 (*) >90 mL/min   CBC     Status: Abnormal   Collection Time   09/01/11  2:50 PM      Component Value Range Comment   WBC 6.3  4.0 - 10.5 K/uL    RBC 2.57 (*) 4.22 - 5.81 MIL/uL    Hemoglobin 7.3 (*) 13.0 - 17.0 g/dL    HCT 78.2 (*) 95.6 - 52.0 %    MCV 82.9  78.0 - 100.0 fL    MCH 28.4  26.0 - 34.0 pg    MCHC 34.3  30.0 - 36.0 g/dL    RDW 21.3 (*) 08.6 - 15.5 %    Platelets 112 (*) 150 - 400 K/uL   PROTIME-INR     Status: Abnormal   Collection Time   09/01/11  2:50 PM      Component Value Range Comment    Prothrombin Time 23.1 (*) 11.6 - 15.2 seconds    INR 2.01 (*) 0.00 - 1.49     Nm Gi Blood Loss  09/01/2011  *RADIOLOGY REPORT*  Clinical Data: Recurrent GI bleeding  NUCLEAR MEDICINE GASTROINTESTINAL BLEEDING STUDY  Technique:  Sequential abdominal images were obtained following intravenous administration of Tc-52m labeled red blood cells.  Radiopharmaceutical: CURIE ULTRATAG TECHNETIUM TC  47M- LABELED RED BLOOD CELLS IV KIT  Comparison: CT abdomen pelvis - 01/24/2011  Findings:  Provided anterior projection planar scintigraphic images demonstrate abnormal pooling of radiolabeled blood within the left upper abdominal quadrant appearing to originate regional to the splenic flexure of the colon.  Additional images demonstrate persistence of extravasated intraluminal radiotracer with transit into the more distal sigmoid colon.  IMPRESSION: Positive tagged red blood cell scan with source of bleed likely within the splenic flexure of the colon, likely SMA distribution.  Above findings discussed with Dr. Darnelle Catalan by Dr. Rito Ehrlich at [phone].   Original Report Authenticated By: Waynard Reeds, M.D.               Blood pressure 142/73, pulse 87, temperature 98.3 F (36.8 C), temperature source Oral, resp. rate 23, height 5\' 9"  (1.753 m), weight 89.8 kg (197 lb 15.6 oz), SpO2 100.00%.  Physical exam:   Alert African American male in no acute distress receiving a bath Lungs clear Heart regular rhythm no murmurs or gallops Abdomen-soft nontender  Assessment: 1. Recurrent GI bleed. The bleeding scan strongly suggest bleeding in the sigmoid colon. The patient does have chronic renal disease and his BUN and creatinine ratio does not clearly suggest upper source. I suspect this is a recurrent lower GI bleed due to diverticulosis. 2. Atrial fibrillation/cardiac stent. Patient has been on anticoagulations with Coumadin recently and with aspirin and Plavix in the past. Clearly this is contributing to his  bleeding.  Plan: 1. Would go ahead and hold the Coumadin and will give him MiraLAX in the hopes of stopping the bleeding. If he continues to bleed may need angiography. The more difficult decision will be what to do about his anticoagulation in the future. If this continues to be a problem an elective colectomy is considered, small bowel capsule endoscopy would be appropriate.   Deberah Adolf JR,Maddeline Roorda L 09/01/2011, 5:00 PM

## 2011-09-01 NOTE — Progress Notes (Signed)
CRITICAL VALUE ALERT  Critical value received:  Hemoglobin  Date of notification:  09/01/11  Time of notification:  0059  Critical value read back:yes  Nurse who received alert:  Rudene Anda  MD notified (1st page):  Craige Cotta, NP  Time of first page:  [phone]  MD notified (2nd page):  Time of second page:  Responding MD:  Orders in place to transfuse 2 units RBCs; first unit in progress.  Time MD responded:

## 2011-09-01 NOTE — Progress Notes (Signed)
[id number] Earlene Plater, RN, BSN, CCM: CHART REVIEWED AND UPDATED. NO DISCHARGE NEEDS PRESENT AT THIS TIME. CASE MANAGEMENT [id number]

## 2011-09-01 NOTE — Progress Notes (Signed)
PT Cancellation Note  ___Treatment cancelled today due to medical issues with patient which prohibited   therapy  ___ Treatment cancelled today due to patient receiving procedure or test   ___ Treatment cancelled today due to patient's refusal to participate   _x__ Evaluation cancelled today due to low Hgb(5.8)    Blanchard Kelch [phone]

## 2011-09-01 NOTE — Progress Notes (Addendum)
[facility] PROGRESS NOTE  Jesse Lambert ZOX:[id number] DOB: 1932/04/30 DOA: 08/31/2011 PCP: Tonye Pearson, MD Oncologist: Jesse Lambert  Brief narrative: Jesse Lambert is a 78 year old man with a PMH of non-Hodgkin's lymphoma, atrial fibrillation on chronic Coumadin, and diverticulosis who was admitted 08/31/2011 with rectal bleeding.  Of note, the patient has had a colonoscopy and EGD 08/18/2011 when he was admitted with lower GI bleeding, which showed diverticulosis but no other abnormalities.  He had been instructed to resume his coumadin at discharge, although his aspirin had been stopped.  Assessment/Plan: Principal Problem:  *Acute blood loss anemia secondary to lower GI bleeding from diverticulosis in the setting of a supratherapeutic INR.  Thought to be secondary to diverticular bleeding in the setting of a supratherapeutic INR.  The patient was given vitamin K on admission.  He is status post 2 units of packed red blood cells.  Spoke with Jesse Lambert and let him know that the patient was admitted with a recurrent GI bleed.  ? If patient needs a tagged RBC scan, arteriogram, or capsule endoscopy.  He recommended a tagged RBC scan, and to hold coumadin for at least 1 week. Active Problems:  NON-HODGKIN'S LYMPHOMA  Seen by Jesse Lambert in office 03/16/11.    Status post radiation to thoracic spine and adjacent areas of involvement from 06/17/08-07/15/08.  Status post CHOP x 6 cycles, completed 11/12/08.  Restaging scans showed no evidence of recurrent disease.  Hypotension with history of HYPERTENSION, UNSPECIFIED  May need to hold anti-hypertensives for SBP < 100, DBP < 60.  Lisinopril on hold, but give Cardizem and Toprol with parameters.  Atrial fibrillation  Currently rate controlled.  Coumadin on hold for now.  I contacted Jesse Lambert to let him know of patient's admission and need to hold coumadin.  Chronic diastolic congestive heart failure  Hold Bumex for  now.  Monitor fluid volume status closely.  AKI (acute kidney injury)  Likely secondary to hypotension.  Hydrate and restore BP.   Code Status: Full Family Communication: None at bedside.   Disposition Plan: Home when stable.   Medical Consultants:  Telephone consult with Jesse Lambert of [facility]  Other Consultants:  None  Procedures:  None  Antibiotics:  None  HPI/Subjective: Jesse Lambert did not have any further episodes of rectal bleeding last night.  He denies dizziness, shortness of breath, chest pain.    Objective: Filed Vitals:   09/01/11 0614 09/01/11 0639 09/01/11 0700 09/01/11 0742  BP: 128/55 125/70 126/76 113/64  Pulse: 88 84 75 89  Temp: 99 F (37.2 C) 98.7 F (37.1 C)  98.8 F (37.1 C)  TempSrc: Oral Oral  Oral  Resp: 22 20 18 20   Height:      Weight:      SpO2: 100%  100%     Intake/Output Summary (Last 24 hours) at 09/01/11 0752 Last data filed at 09/01/11 8119  Gross per 24 hour  Intake 1241.67 ml  Output    600 ml  Net 641.67 ml    Exam: Gen:  NAD Cardiovascular:  HSIR with II/VI SEM Respiratory: Lungs CTAB Gastrointestinal: Abdomen soft, NT/ND with normal active bowel sounds. Extremities: No C/E/C  Data Reviewed: Basic Metabolic Panel:  Lab 09/01/11 1478 08/31/11 2118  NA 140 143  K 4.0 4.0  CL 107 107  CO2 26 --  GLUCOSE 115* 150*  BUN 43* 43*  CREATININE 1.60* 1.70*  CALCIUM 8.3* --  MG 2.1 --  PHOS 4.0 --  GFR Estimated Creatinine Clearance: 42.8 ml/min (by C-G formula based on Cr of 1.6). Liver Function Tests:  Lab 09/01/11 0025  AST 16  ALT 12  ALKPHOS 67  BILITOT 0.3  PROT 5.6*  ALBUMIN 2.7*   Coagulation profile  Lab 09/01/11 0025 08/31/11 2105 08/26/11 0916  INR 3.68* 3.34* 2.7  PROTIME -- -- --    CBC:  Lab 09/01/11 0025 08/31/11 2118 08/31/11 2105  WBC 6.9 -- 9.9  NEUTROABS 4.5 -- 5.6  HGB 5.8* 7.8* 7.3*  HCT 17.7* 23.0* 23.1*  MCV 82.7 -- 84.3  PLT 127* -- 167   BNP (last 3  results)  Basename 08/18/11 1010  PROBNP 2199.0*   CBG:  Lab 09/01/11 0742  GLUCAP 83    Studies: No results found.  Scheduled Meds:   . antiseptic oral rinse  15 mL Mouth Rinse BID  . atorvastatin  10 mg Oral Daily  . bumetanide  2 mg Oral Daily  . diltiazem  120 mg Oral Daily  . metoprolol succinate  200 mg Oral Daily  . multivitamin with minerals  1 tablet Oral Daily  . pantoprazole (PROTONIX) IV  40 mg Intravenous Q12H  . phytonadione (VITAMIN K) IV  5 mg Intravenous Once  . sodium chloride  3 mL Intravenous Q12H  . vitamin E  100 Units Oral Daily  . DISCONTD: ferrous sulfate  325 mg Oral BID   Continuous Infusions:   . sodium chloride 50 mL/hr (09/01/11 0010)    Time spent: 35 minutes.  The patient is critically ill and I spent 35 minutes with the patient and an additional 20 minutes in non-direct patient care speaking with his other providers.   LOS: 1 day   Jesse Lambert  [facility] Pager [phone].  If 8PM-8AM, please contact night-coverage at [email or web address], password [facility] 09/01/2011, 7:52 AM

## 2011-09-02 DIAGNOSIS — D696 Thrombocytopenia, unspecified: Secondary | ICD-10-CM | POA: Diagnosis present

## 2011-09-02 LAB — PREPARE FRESH FROZEN PLASMA: Unit division: 0

## 2011-09-02 LAB — BASIC METABOLIC PANEL
BUN: 43 mg/dL — ABNORMAL HIGH (ref 6–23)
Chloride: 109 mEq/L (ref 96–112)
GFR calc non Af Amer: 46 mL/min — ABNORMAL LOW (ref >90)
Glucose, Bld: 111 mg/dL — ABNORMAL HIGH (ref 70–99)
Potassium: 4.4 mEq/L (ref 3.5–5.1)

## 2011-09-02 LAB — PREPARE RBC (CROSSMATCH)

## 2011-09-02 LAB — HEMOGLOBIN AND HEMATOCRIT, BLOOD
HCT: 26.1 % — ABNORMAL LOW (ref 39.0–52.0)
HCT: 24.2 % — ABNORMAL LOW (ref 39.0–52.0)
Hemoglobin: 8.7 g/dL — ABNORMAL LOW (ref 13.0–17.0)

## 2011-09-02 LAB — CBC
HCT: 18.3 % — ABNORMAL LOW (ref 39.0–52.0)
Hemoglobin: 6 g/dL — CL (ref 13.0–17.0)
MCH: 27.1 pg (ref 26.0–34.0)
MCHC: 32.8 g/dL (ref 30.0–36.0)
MCV: 82.8 fL (ref 78.0–100.0)

## 2011-09-02 LAB — GLUCOSE, CAPILLARY: Glucose-Capillary: 104 mg/dL — ABNORMAL HIGH (ref 70–99)

## 2011-09-02 MED ORDER — ZOLPIDEM TARTRATE 5 MG PO TABS
5.0000 mg | ORAL_TABLET | Freq: Once | ORAL | Status: AC
  Administered 2011-09-02: 5 mg via ORAL
  Filled 2011-09-02: qty 1

## 2011-09-02 NOTE — Progress Notes (Signed)
PT Cancellation Note  Treatment cancelled today due to medical issues with patient which prohibited therapy.  Pt Hgb 6.0 and supratherapeutic INR.  [facility] 09/02/2011, 10:35 AM

## 2011-09-02 NOTE — Progress Notes (Signed)
CRITICAL VALUE ALERT  Critical value received:  Hgb 6.0  Date of notification:  8 30 13   Time of notification:  [phone]  Critical value read back:yes  Nurse who received alert:  Cleotis Lema  MD notified (1st page):  Craige Cotta NP  Time of first page:  [phone]  MD notified (2nd page):  Time of second page:  Responding MD:  Craige Cotta, NP  Time MD responded:  [phone]

## 2011-09-02 NOTE — Plan of Care (Signed)
Problem: Phase I Progression Outcomes Goal: Hemodynamically stable Outcome: Progressing Bp low. Pt received 2 units FFP last night and is currently receiving PRBC x 2 now.

## 2011-09-02 NOTE — Progress Notes (Signed)
Subjective: Is hungry. No further bleeding since late yesterday afternoon. Tagged RBC study positive in left colon.  Objective: Vital signs in last 24 hours: Temp:  [98 F (36.7 C)-99 F (37.2 C)] 98.5 F (36.9 C) (08/30 0855) Pulse Rate:  [52-149] 76  (08/30 0855) Resp:  [18-28] 24  (08/30 0855) BP: (82-142)/(40-99) 115/64 mmHg (08/30 0855) SpO2:  [86 %-100 %] 100 % (08/30 0600) Weight:  [89.1 kg (196 lb 6.9 oz)] 89.1 kg (196 lb 6.9 oz) (08/30 0500) Weight change: -0.7 kg (-1 lb 8.7 oz) Last BM Date: 09/01/11  PE: GEN:  NAD ABD:  Somewhat distended, but non-tender, modestly hyperactive bowel sounds.  Assessment:  1.  Diverticular bleeding, localized on tagged RBC study to sigmoid/descending colon.  No bleeding since late yesterday afternoon.  Hemodynamically stable. 2.  Acute blood loss anemia. 3.  Atrial fibrillation, on chronic warfarin, currently being held.  Plan:  1.  Continue to hold warfarin; I feel risks of warfarin for the next several weeks likely outweigh the benefits, but would get cardiology's input on this as well.  2.  Serial CBCs, transfusions as needed. 3.  Advance diet. 4.  If further bleeding, would consider interventional radiology consultation for consideration of angiogram. 5.  Will follow.   Freddy Jaksch 09/02/2011, 10:06 AM

## 2011-09-02 NOTE — Progress Notes (Signed)
[facility] PROGRESS NOTE  Jesse Lambert Lambert RUE:[id number] DOB: Sep 26, 1932 DOA: 08/31/2011 PCP: Tonye Pearson, MD Oncologist: Dr. Welton Flakes  Brief narrative: Jesse Lambert Lambert is a 78 year old man with a PMH of non-Hodgkin's lymphoma, atrial fibrillation on chronic Coumadin, and diverticulosis who was admitted 08/31/2011 with rectal bleeding.  Of note, the patient has had a colonoscopy and EGD 08/18/2011 when he was admitted with lower GI bleeding, which showed diverticulosis but no other abnormalities.  He had been instructed to resume his coumadin at discharge, although his aspirin had been stopped.  The patient underwent a tagged RBC scan 09/01/11 which showed the source of the bleed as likely within the splenic flexure of the colon (SMA distribution).    Assessment/Plan: Principal Problem:  *Acute blood loss anemia secondary to lower GI bleeding from diverticulosis in the setting of a supratherapeutic INR.  Thought to be secondary to diverticular bleeding in the setting of a supratherapeutic INR.  The patient was given vitamin K on admission, and 2 units of FFP on 09/01/11 to reverse his INR.  He is status post 4 units of packed red blood cells.  Dr. Randa Evens of GI saw him in consultation 09/01/11, and recommended a tagged RBC scan, and to hold coumadin for at least 1 week.  The tagged RBC scan showed the source of the bleed as being within the splenic flexure of the colon.  If he continues to bleed with conservative therapy, may need angiography or an elective colectomy.  Advance diet as tolerated. Active Problems: Thrombocytopenia  Likely from NHL.  No indication for platelet transfusion at present.  NON-HODGKIN'S LYMPHOMA  Seen by Dr. Welton Flakes in office 03/16/11.    Status post radiation to thoracic spine and adjacent areas of involvement from 06/17/08-07/15/08.  Status post CHOP x 6 cycles, completed 11/12/08.  Restaging scans showed no evidence of recurrent disease.  Hypotension with history of HYPERTENSION, UNSPECIFIED  May need to hold anti-hypertensives for SBP < 100, DBP < 60.  Lisinopril on hold, but give Cardizem and Toprol with parameters.  Atrial fibrillation  Currently rate controlled.  Coumadin on hold for now.  I contacted Dr. Johney Frame 09/01/11 to let him know of patient's admission and need to hold coumadin.  Chronic diastolic congestive heart failure  Hold Bumex for now.  Monitor fluid volume status closely.  AKI (acute kidney injury)  Likely secondary to hypotension.  Hydrate and restore BP.  Creatinine slowly improving.   Code Status: Full Family Communication: None at bedside.   Disposition Plan: Home when stable.   Medical Consultants:  Dr. Carman Ching, Gastroenterology  Other Consultants:  None  Procedures:  Tagged RBC scan 09/01/11.  Antibiotics:  None  HPI/Subjective: Jesse Lambert Lambert had one episode of melenotic stools yesterday, but none since and no BMs overnight.  He is hungry.  Denies abdominal pain.  No dyspnea, pre-syncope or chest pain. Objective: Filed Vitals:   09/02/11 0530 09/02/11 0545 09/02/11 0600 09/02/11 0645  BP: 112/64 108/59 96/68 113/63  Pulse: 83 99 79 79  Temp: 98.6 F (37 C) 99 F (37.2 C)  98.7 F (37.1 C)  TempSrc: Oral Oral  Oral  Resp: 20 20 26 22   Height:      Weight:      SpO2: 100%  100%     Intake/Output Summary (Last 24 hours) at 09/02/11 0719 Last data filed at 09/02/11 0645  Gross per 24 hour  Intake 1762.5 ml  Output   1000 ml  Net  762.5 ml  Exam: Gen:  NAD Cardiovascular:  HSIR with II/VI SEM Respiratory: Lungs CTAB Gastrointestinal: Abdomen soft, NT/ND with normal active bowel sounds. Extremities: No C/E/C  Data Reviewed: Basic Metabolic Panel:  Lab 09/02/11 1610 09/01/11 1450 09/01/11 0025 08/31/11 2118  NA 141 141 140 143  K 4.4 4.1 -- --  CL 109 109 107 107  CO2 24 25 26  --  GLUCOSE 111* 103* 115* 150*  BUN 43* 42* 43* 43*    CREATININE 1.43* 1.52* 1.60* 1.70*  CALCIUM 8.2* 8.1* 8.3* --  MG -- -- 2.1 --  PHOS -- -- 4.0 --   GFR Estimated Creatinine Clearance: 47.8 ml/min (by C-G formula based on Cr of 1.43). Liver Function Tests:  Lab 09/01/11 1450 09/01/11 0025  AST 16 16  ALT 11 12  ALKPHOS 58 67  BILITOT 0.5 0.3  PROT 5.6* 5.6*  ALBUMIN 2.7* 2.7*   Coagulation profile  Lab 09/02/11 0318 09/01/11 1450 09/01/11 0025 08/31/11 2105 08/26/11 0916  INR 1.80* 2.01* 3.68* 3.34* 2.7  PROTIME -- -- -- -- --    CBC:  Lab 09/02/11 0318 09/01/11 1450 09/01/11 0025 08/31/11 2118 08/31/11 2105  WBC 6.1 6.3 6.9 -- 9.9  NEUTROABS -- -- 4.5 -- 5.6  HGB 6.0* 7.3* 5.8* 7.8* 7.3*  HCT 18.3* 21.3* 17.7* 23.0* 23.1*  MCV 82.8 82.9 82.7 -- 84.3  PLT 100* 112* 127* -- 167   BNP (last 3 results)  Basename 08/18/11 1010  PROBNP 2199.0*   CBG:  Lab 09/01/11 1801 09/01/11 0742  GLUCAP 80 83    Studies: No results found.  Scheduled Meds:    . antiseptic oral rinse  15 mL Mouth Rinse BID  . atorvastatin  10 mg Oral Daily  . diltiazem  120 mg Oral Daily  . metoprolol succinate  200 mg Oral Daily  . multivitamin with minerals  1 tablet Oral Daily  . pantoprazole (PROTONIX) IV  40 mg Intravenous Q12H  . polyethylene glycol  17 g Oral BID  . sodium chloride  3 mL Intravenous Q12H  . vitamin E  100 Units Oral Daily  . DISCONTD: bumetanide  2 mg Oral Daily   Continuous Infusions:    . sodium chloride 50 mL/hr at 09/02/11 0130    Time spent: 35 minutes.     LOS: 2 days   Jesse Lambert Lambert  [facility] Pager [phone].  If 8PM-8AM, please contact night-coverage at [email or web address], password [facility] 09/02/2011, 7:19 AM

## 2011-09-02 NOTE — Progress Notes (Signed)
Craige Cotta, NP called because BP in the 90's systolic. She has said it is OK to continue transfusing FFP. She was also told that the patient had a H & H lab due to be drawn at 23:00 but the  Second unit of FFP would not be finished infusing at that time. She said it was ok to draw H&H 2 hours after the second unit of FFP finishes infusing, even if that is with morning labs.

## 2011-09-03 LAB — TYPE AND SCREEN
Unit division: 0
Unit division: 0

## 2011-09-03 LAB — GLUCOSE, CAPILLARY: Glucose-Capillary: 175 mg/dL — ABNORMAL HIGH (ref 70–99)

## 2011-09-03 LAB — CBC
Hemoglobin: 8 g/dL — ABNORMAL LOW (ref 13.0–17.0)
MCH: 27.7 pg (ref 26.0–34.0)
MCHC: 32.9 g/dL (ref 30.0–36.0)
MCV: 84.1 fL (ref 78.0–100.0)
RBC: 2.89 MIL/uL — ABNORMAL LOW (ref 4.22–5.81)

## 2011-09-03 LAB — BASIC METABOLIC PANEL
BUN: 32 mg/dL — ABNORMAL HIGH (ref 6–23)
CO2: 23 mEq/L (ref 19–32)
GFR calc non Af Amer: 46 mL/min — ABNORMAL LOW (ref >90)
Glucose, Bld: 139 mg/dL — ABNORMAL HIGH (ref 70–99)
Potassium: 4.2 mEq/L (ref 3.5–5.1)
Sodium: 139 mEq/L (ref 135–145)

## 2011-09-03 LAB — HEMOGLOBIN AND HEMATOCRIT, BLOOD: HCT: 24.6 % — ABNORMAL LOW (ref 39.0–52.0)

## 2011-09-03 LAB — PROTIME-INR: Prothrombin Time: 21.7 seconds — ABNORMAL HIGH (ref 11.6–15.2)

## 2011-09-03 NOTE — Evaluation (Signed)
Physical Therapy Evaluation Patient Details Name: Jesse Lambert MRN: [id number] DOB: 03-Nov-1932 Today's Date: 09/03/2011 Time: 7846-9629 PT Time Calculation (min): 25 min  PT Assessment / Plan / Recommendation Clinical Impression  78 yo admitted with rectal bleeding with decreased hemoglobin this admission.  Apparently I awoke patient out of the first sound sleep he has had in several days and he cooperated with me for assessment of mobility and ambulation.  Pt is able to move well, but  does become dyspnea with ambulation.  Anticipate this may be due to deconditioning from several days of bedrest and that this will improve as he increases his activity.  Do not feel patient with need continued PT or DME at discharge. Recommend pt ambulate ad lib with nursing and his family.  RW left in room for his initial attemps for his safety    PT Assessment  Patent does not need any further PT services    Follow Up Recommendations  No PT follow up    Barriers to Discharge        Equipment Recommendations  None recommended by PT    Recommendations for Other Services     Frequency      Precautions / Restrictions     Pertinent Vitals/Pain No c/o pain.  Pt with dyspnea on exertion      Mobility  Bed Mobility Bed Mobility: Rolling Right;Rolling Left;Supine to Sit Rolling Right: 7: Independent Rolling Left: 7: Independent Supine to Sit: 7: Independent Transfers Transfers: Sit to Stand;Stand to Sit Sit to Stand: 7: Independent Stand to Sit: 7: Independent Ambulation/Gait Ambulation/Gait Assistance: 6: Modified independent (Device/Increase time) Ambulation Distance (Feet): 150 Feet Assistive device: 1 person hand held assist Ambulation/Gait Assistance Details: hand hold assist for  Mild balance losss Gait velocity: pt self selects a quick pace General Gait Details: pt with dyspnea on exetion though O2 sats stay > 90%.   Stairs: No Wheelchair Mobility Wheelchair Mobility: No      Exercises     PT Diagnosis:    PT Problem List:   PT Treatment Interventions:     PT Goals    Visit Information  Last PT Received On: 09/03/11 Assistance Needed: +1    Subjective Data  Subjective: "that was the first real sleep I've had" Patient Stated Goal: to return home   Prior Functioning  Home Living Lives With: Spouse Available Help at Discharge: Family Type of Home: Other (Comment) (two level townhome) Home Access: Stairs to enter Home Layout: Two level Alternate Level Stairs-Number of Steps: 2 flights Alternate Level Stairs-Rails: Can reach both Home Adaptive Equipment: Straight cane Prior Function Level of Independence: Independent Able to Take Stairs?: Yes Communication Communication: No difficulties    Cognition  Overall Cognitive Status: Appears within functional limits for tasks assessed/performed Arousal/Alertness: Awake/alert Orientation Level: Appears intact for tasks assessed Behavior During Session: [facility] for tasks performed    Extremity/Trunk Assessment Right Lower Extremity Assessment RLE ROM/Strength/Tone: Within functional levels RLE Sensation: History of peripheral neuropathy;WFL - Light Touch (chemotherapy almost 3 years ago) RLE Coordination: WFL - gross/fine motor Left Lower Extremity Assessment LLE ROM/Strength/Tone: Within functional levels LLE Sensation: History of peripheral neuropathy;WFL - Light Touch LLE Coordination: WFL - gross/fine motor Trunk Assessment Trunk Assessment: Normal   Balance Balance Balance Assessed: Yes Static Sitting Balance Static Sitting - Level of Assistance: 7: Independent Static Sitting - Comment/# of Minutes: 3 Static Standing Balance Static Standing - Balance Support: No upper extremity supported Static Standing - Level of Assistance: 5:  Stand by assistance Static Standing - Comment/# of Minutes: 3  End of Session    GP     Teresa K. [place], [place] [phone] 09/03/2011, 2:16 PM

## 2011-09-03 NOTE — Progress Notes (Signed)
09/03/2011 patient transfer from icu to oncology east at 1145. He is alert, oriented and ambulatory. Patient skin is fine, but bilateral feet is dry in between the toes. Patient is in room with fine and had no issues when arrived on unit. Gloriajean Dell RN

## 2011-09-03 NOTE — Progress Notes (Signed)
Jesse Lambert 12:52 PM  Subjective: The patient said he had minimal pink blood x1 but otherwise is doing well and eating regular food and his case was discussed with Dr. Dulce Sellar and his wife  Objective: Vital signs stable afebrile hemoglobin stable patient not examined today  Assessment: Diverticular bleeding in a patient on Coumadin  Plan: Blood thinners per cardiology and hold as long as possible and minimize as much as possible in the future and consider all blood thinner options and let us know if we can help and consider angiogram if he restarts bleeding  Jesse Lambert E

## 2011-09-03 NOTE — Progress Notes (Signed)
[facility] PROGRESS NOTE  KALIQ LEGE WUJ:[id number] DOB: 02-08-1932 DOA: 08/31/2011 PCP: Tonye Pearson, MD Oncologist: Dr. Welton Flakes  Brief narrative: Mr. Minix is a 78 year old man with a PMH of non-Hodgkin's lymphoma, atrial fibrillation on chronic Coumadin, and diverticulosis who was admitted 08/31/2011 with rectal bleeding.  Of note, the patient has had a colonoscopy and EGD 08/18/2011 when he was admitted with lower GI bleeding, which showed diverticulosis but no other abnormalities.  He had been instructed to resume his coumadin at discharge, although his aspirin had been stopped.  The patient underwent a tagged RBC scan 09/01/11 which showed the source of the bleed as likely within the splenic flexure of the colon (SMA distribution).  He is now hemodynamically stable, off coumadin, with hemoglobin stable x 12 hours.  Assessment/Plan: Principal Problem:  *Acute blood loss anemia secondary to lower GI bleeding from diverticulosis in the setting of a supratherapeutic INR.  Thought to be secondary to diverticular bleeding in the setting of a supratherapeutic INR.  The patient was given vitamin K on admission, and 2 units of FFP on 09/01/11 to reverse his INR.  He is status post 4 units of packed red blood cells.  Dr. Randa Evens of GI saw him in consultation 09/01/11, and recommended a tagged RBC scan, and to hold coumadin for at least 1 week.  The tagged RBC scan showed the source of the bleed as being within the splenic flexure of the colon.  If he continues to bleed with conservative therapy, may need angiography or an elective colectomy.  Tolerated advancement of diet.  Transfer to floor, monitor counts an additional 24 hours, and if stable, d/c home off coumadin. Active Problems: Thrombocytopenia  Likely from NHL.  No indication for platelet transfusion at present.  NON-HODGKIN'S LYMPHOMA  Seen by Dr. Welton Flakes in office 03/16/11.    Status post radiation to thoracic  spine and adjacent areas of involvement from 06/17/08-07/15/08.  Status post CHOP x 6 cycles, completed 11/12/08.  Restaging scans showed no evidence of recurrent disease.  Hypotension with history of HYPERTENSION, UNSPECIFIED  May need to hold anti-hypertensives for SBP < 100, DBP < 60.  Lisinopril on hold, but give Cardizem and Toprol with parameters.  Atrial fibrillation  Currently rate controlled.  Coumadin on hold for now.  I contacted Dr. Johney Frame 09/01/11 to let him know of patient's admission and need to hold coumadin.  Chronic diastolic congestive heart failure  Hold Bumex for now.  Monitor fluid volume status closely.  AKI (acute kidney injury)  Likely secondary to hypotension.  Hydrate and restore BP.  Creatinine slowly improving.   Code Status: Full Family Communication: None at bedside.   Disposition Plan: Home when stable, possibly 09/04/11.   Medical Consultants:  Dr. Carman Ching, Gastroenterology  Other Consultants:  None  Procedures:  Tagged RBC scan 09/01/11.  Antibiotics:  None  HPI/Subjective: Mr. Cabello reports a black/bloody stool yesterday around 6 p.m. No indigestion, N/V or other GI symptoms.  No chest pain or dyspnea.  Objective: Filed Vitals:   09/02/11 2000 09/03/11 0000 09/03/11 0400 09/03/11 0500  BP: 100/46 118/72 140/88   Pulse: 70 89    Temp: 98.3 F (36.8 C) 98.1 F (36.7 C) 98.4 F (36.9 C)   TempSrc:      Resp: 23 24    Height:      Weight:    75.2 kg (165 lb 12.6 oz)  SpO2: 100% 100%      Intake/Output Summary (Last 24 hours)  at 09/03/11 0820 Last data filed at 09/03/11 0700  Gross per 24 hour  Intake 1324.17 ml  Output   1225 ml  Net  99.17 ml    Exam: Gen:  NAD Cardiovascular:  HSIR with II/VI SEM Respiratory: Lungs CTAB Gastrointestinal: Abdomen soft, NT/ND with normal active bowel sounds. Extremities: No C/E/C  Data Reviewed: Basic Metabolic Panel:  Lab 09/03/11 0981 09/02/11 0318 09/01/11  1450 09/01/11 0025 08/31/11 2118  NA 139 141 141 140 143  K 4.2 4.4 -- -- --  CL 108 109 109 107 107  CO2 23 24 25 26  --  GLUCOSE 139* 111* 103* 115* 150*  BUN 32* 43* 42* 43* 43*  CREATININE 1.43* 1.43* 1.52* 1.60* 1.70*  CALCIUM 8.0* 8.2* 8.1* 8.3* --  MG -- -- -- 2.1 --  PHOS -- -- -- 4.0 --   GFR Estimated Creatinine Clearance: 43.3 ml/min (by C-G formula based on Cr of 1.43). Liver Function Tests:  Lab 09/01/11 1450 09/01/11 0025  AST 16 16  ALT 11 12  ALKPHOS 58 67  BILITOT 0.5 0.3  PROT 5.6* 5.6*  ALBUMIN 2.7* 2.7*   Coagulation profile  Lab 09/03/11 0404 09/02/11 0318 09/01/11 1450 09/01/11 0025 08/31/11 2105  INR 1.85* 1.80* 2.01* 3.68* 3.34*  PROTIME -- -- -- -- --    CBC:  Lab 09/03/11 0404 09/02/11 2243 09/02/11 1254 09/02/11 0318 09/01/11 1450 09/01/11 0025 08/31/11 2105  WBC 6.5 -- -- 6.1 6.3 6.9 9.9  NEUTROABS -- -- -- -- -- 4.5 5.6  HGB 8.0* 8.0* 8.7* 6.0* 7.3* -- --  HCT 24.3* 24.2* 26.1* 18.3* 21.3* -- --  MCV 84.1 -- -- 82.8 82.9 82.7 84.3  PLT 101* -- -- 100* 112* 127* 167   BNP (last 3 results)  Basename 08/18/11 1010  PROBNP 2199.0*   CBG:  Lab 09/02/11 0754 09/01/11 1801 09/01/11 0742  GLUCAP 104* 80 83    Studies: No results found.  Scheduled Meds:    . antiseptic oral rinse  15 mL Mouth Rinse BID  . atorvastatin  10 mg Oral Daily  . diltiazem  120 mg Oral Daily  . metoprolol succinate  200 mg Oral Daily  . multivitamin with minerals  1 tablet Oral Daily  . pantoprazole (PROTONIX) IV  40 mg Intravenous Q12H  . polyethylene glycol  17 g Oral BID  . sodium chloride  3 mL Intravenous Q12H  . zolpidem  5 mg Oral Once  . DISCONTD: vitamin E  100 Units Oral Daily   Continuous Infusions:    . sodium chloride 50 mL/hr at 09/02/11 1543    Time spent: 35 minutes.     LOS: 3 days   RAMA,CHRISTINA  [facility] Pager [phone].  If 8PM-8AM, please contact night-coverage at [email or web address], password [facility] 09/03/2011, 8:20  AM

## 2011-09-04 LAB — HEMOGLOBIN AND HEMATOCRIT, BLOOD
HCT: 24.5 % — ABNORMAL LOW (ref 39.0–52.0)
Hemoglobin: 8.3 g/dL — ABNORMAL LOW (ref 13.0–17.0)

## 2011-09-04 MED ORDER — PANTOPRAZOLE SODIUM 40 MG PO TBEC: 40.0000 mg | DELAYED_RELEASE_TABLET | Freq: Two times a day (BID) | ORAL | Status: DC

## 2011-09-04 MED ORDER — POLYETHYLENE GLYCOL 3350 17 G PO PACK: 17.0000 g | PACK | Freq: Two times a day (BID) | ORAL | Status: AC

## 2011-09-04 NOTE — Progress Notes (Signed)
Pt reported he felt SOB after getting out of the bed. Resp 28, however oxygen sats 100% on RA. Vitals taken and stable. RN left room for 3 minutes and returned. Pt states " I am fine now". Respirations at 20 calm, color good, denies SOB. C/S "Im not sure what was wrong, Im fine now". Reported he sometime has anxious periods.. Will follow and told to notify RN is any problems arise.

## 2011-09-04 NOTE — Discharge Summary (Signed)
Physician Discharge Summary  Jesse Lambert:[id number] DOB: 07-13-1932 DOA: 08/31/2011  PCP: Jesse Pearson, MD  Admit date: 08/31/2011 Discharge date: 09/04/2011  Recommendations for Outpatient Follow-up:  1. Close F/U with cardiologist to determine if/when safe to resume anti-coagulation. 2. F/U H&H in 1 week.  Discharge Diagnoses:   Principal Problem:  *Acute blood loss anemia secondary to lower GI bleeding from diverticulosis in the setting of a supratherapeutic INR  Active Problems:   NON-HODGKIN'S LYMPHOMA   Hypotension with a history of hypertension   Atrial fibrillation   Chronic diastolic congestive heart failure   AKI (acute kidney injury)   GI bleed   Thrombocytopenia   Discharge Condition: Improved.  Diet recommendation: Low sodium, heart healthy.  History of present illness:  Jesse Lambert is a 78 year old man with a PMH of non-Hodgkin's lymphoma, atrial fibrillation on chronic Coumadin, and diverticulosis who was admitted 08/31/2011 with rectal bleeding. Of note, the patient has had a colonoscopy and EGD 08/18/2011 when he was admitted with lower GI bleeding, which showed diverticulosis but no other abnormalities. He had been instructed to resume his coumadin at discharge, although his aspirin had been stopped.    Hospital Course by problem:  Principal Problem:  *Acute blood loss anemia secondary to lower GI bleeding from diverticulosis in the setting of a supratherapeutic INR.  Thought to be secondary to diverticular bleeding in the setting of a supratherapeutic INR.  The patient was given vitamin K on admission, and 2 units of FFP on 09/01/11 to reverse his INR.  He is status post 4 units of packed red blood cells.  Jesse Lambert of GI saw him in consultation 09/01/11, and recommended a tagged RBC scan, and to hold coumadin for at least 1 week. The tagged RBC scan showed the source of the bleed as being within the splenic flexure of the colon.    Tolerated advancement of diet.  H&H stable x 48 hours prior to discharge. Per GI: Hold coumadin as long as possible and consider all blood thinner options.  If re-bleeding occurs, consider angiogram or an elective colectomy. Active Problems:  Thrombocytopenia  Likely from NHL.  No indication for platelet transfusion at present. NON-HODGKIN'S LYMPHOMA  Seen by Jesse Lambert in office 03/16/11.  Status post radiation to thoracic spine and adjacent areas of involvement from 06/17/08-07/15/08.  Status post CHOP x 6 cycles, completed 11/12/08.  Restaging scans showed no evidence of recurrent disease. F/U Jesse Lambert post discharge. Hypotension with history of HYPERTENSION, UNSPECIFIED  May need to hold anti-hypertensives for SBP < 100, DBP < 60.  Lisinopril held during hospitalization, but given Cardizem and Toprol with parameters. BP improved and in the hypertensive range at discharge. Atrial fibrillation  Currently rate controlled.  Coumadin on hold for now.  I contacted Jesse Lambert 09/01/11 to let him know of patient's admission and need to hold coumadin. Chronic diastolic congestive heart failure  Held Bumex while in hospital, OK to resume at discharge.  Monitored fluid volume status closely.  No signs of decompensation. AKI (acute kidney injury)  Likely secondary to hypotension.  Hydrated and restored BP.  Creatinine slowly improving.  Medical Consultants:  Jesse Lambert, Gastroenterology Other Consultants:  Physical therapy: No PT services needed. Procedures:  Tagged RBC scan 09/01/11.   Discharge Exam: Filed Vitals:   09/04/11 0530  BP: 123/74  Pulse: 68  Temp: 98.3 F (36.8 C)  Resp: 24   Filed Vitals:   09/03/11 1334 09/03/11 1427 09/03/11 2352 09/04/11 0530  BP: 118/72 125/102 134/79 123/74  Pulse:  88 72 68  Temp:  98.3 F (36.8 C) 98.4 F (36.9 C) 98.3 F (36.8 C)  TempSrc:  Oral Oral Oral  Resp:  20 20 24   Height:      Weight:      SpO2: 100% 92% 100% 100%     Gen:  NAD Cardiovascular:  RRR, No M/R/G Respiratory: Lungs CTAB Gastrointestinal: Abdomen soft, NT/ND with normal active bowel sounds. Extremities: No C/E/C   Discharge Instructions  Discharge Orders    Future Appointments: Provider: Department: Dept Phone: Center:   09/09/2011 9:45 AM [facility] [phone] None     Future Orders Please Complete By Expires   Diet - low sodium heart healthy      Discharge instructions      Comments:   Do not take vitamin E for at least one week as vitamin E has anti-coagulant properties.   Increase activity slowly      Call MD for:      Scheduling Instructions:   Any signs of recurrent GIB (black or bloody stools), dizziness, shortness of breath or chest pain.     Medication List  As of 09/04/2011 10:37 AM   STOP taking these medications         vitamin E 100 UNIT capsule      warfarin 5 MG tablet         TAKE these medications         atorvastatin 20 MG tablet   Commonly known as: LIPITOR   Take 10 mg by mouth daily.      bumetanide 1 MG tablet   Commonly known as: BUMEX   Take 2 mg by mouth daily. Prescribed from [place] hospitalization      diltiazem 120 MG 24 hr capsule   Commonly known as: CARDIZEM CD   Take 120 mg by mouth daily.      ferrous sulfate 325 (65 FE) MG tablet   Take 325 mg by mouth 2 (two) times daily.      lisinopril 5 MG tablet   Commonly known as: PRINIVIL,ZESTRIL   Take 2.5 mg by mouth daily.      metoprolol succinate 100 MG 24 hr tablet   Commonly known as: TOPROL-XL   Take 200 mg by mouth daily. Take two tablets Take with or immediately following a meal.      multivitamin with minerals Tabs   Take 1 tablet by mouth daily.      pantoprazole 40 MG tablet   Commonly known as: PROTONIX   Take 1 tablet (40 mg total) by mouth 2 (two) times daily.      polyethylene glycol packet   Commonly known as: MIRALAX / GLYCOLAX   Take 17 g by mouth 2 (two) times daily.       VITAMIN B-12 CR PO   Take 1 tablet by mouth daily.           Follow-up Information    Follow up with DOOLITTLE, Harrel Lemon, MD. Schedule an appointment as soon as possible for a visit in 1 week.   Contact information:   [address] [place] [address] [phone]       Follow up with Hillis Range, MD. Schedule an appointment as soon as possible for a visit in 1 week.   Contact information:   [address] [place] [address] [phone]  The results of significant diagnostics from this hospitalization (including imaging, microbiology, ancillary and laboratory) are listed below for reference.    Significant Diagnostic Studies: Nm Gi Blood Loss  09/01/2011  *RADIOLOGY REPORT*  Clinical Data: Recurrent GI bleeding  NUCLEAR MEDICINE GASTROINTESTINAL BLEEDING STUDY  Technique:  Sequential abdominal images were obtained following intravenous administration of Tc-38m labeled red blood cells.  Radiopharmaceutical: CURIE ULTRATAG TECHNETIUM TC 72M- LABELED RED BLOOD CELLS IV KIT  Comparison: CT abdomen pelvis - 01/24/2011  Findings:  Provided anterior projection planar scintigraphic images demonstrate abnormal pooling of radiolabeled blood within the left upper abdominal quadrant appearing to originate regional to the splenic flexure of the colon.  Additional images demonstrate persistence of extravasated intraluminal radiotracer with transit into the more distal sigmoid colon.  IMPRESSION: Positive tagged red blood cell scan with source of bleed likely within the splenic flexure of the colon, likely SMA distribution.  Above findings discussed with Dr. Darnelle Catalan by Dr. Rito Ehrlich at [phone].   Original Report Authenticated By: Waynard Reeds, M.D.     Microbiology: No results found for this or any previous visit (from the past 240 hour(s)).   Labs: Basic Metabolic Panel:  Lab 09/03/11 0981 09/02/11 0318 09/01/11 1450 09/01/11 0025  08/31/11 2118  NA 139 141 141 140 143  K 4.2 4.4 4.1 4.0 4.0  CL 108 109 109 107 107  CO2 23 24 25 26  --  GLUCOSE 139* 111* 103* 115* 150*  BUN 32* 43* 42* 43* 43*  CREATININE 1.43* 1.43* 1.52* 1.60* 1.70*  CALCIUM 8.0* 8.2* 8.1* 8.3* --  MG -- -- -- 2.1 --  PHOS -- -- -- 4.0 --   Liver Function Tests:  Lab 09/01/11 1450 09/01/11 0025  AST 16 16  ALT 11 12  ALKPHOS 58 67  BILITOT 0.5 0.3  PROT 5.6* 5.6*  ALBUMIN 2.7* 2.7*   No results found for this basename: LIPASE:5,AMYLASE:5 in the last 168 hours No results found for this basename: AMMONIA:5 in the last 168 hours CBC:  Lab 09/04/11 0422 09/03/11 1750 09/03/11 0404 09/02/11 2243 09/02/11 1254 09/02/11 0318 09/01/11 1450 09/01/11 0025 08/31/11 2105  WBC -- -- 6.5 -- -- 6.1 6.3 6.9 9.9  NEUTROABS -- -- -- -- -- -- -- 4.5 5.6  HGB 8.3* 8.0* 8.0* 8.0* 8.7* -- -- -- --  HCT 24.5* 24.6* 24.3* 24.2* 26.1* -- -- -- --  MCV -- -- 84.1 -- -- 82.8 82.9 82.7 84.3  PLT -- -- 101* -- -- 100* 112* 127* 167    CBG:  Lab 09/03/11 0848 09/02/11 0754 09/01/11 1801 09/01/11 0742  GLUCAP 175* 104* 80 83    Time coordinating discharge: 35 minutes.  Signed:  Kristen Fromm  Pager [phone] [facility] 09/04/2011, 10:37 AM

## 2011-09-04 NOTE — Progress Notes (Signed)
Jesse Lambert 9:38 AM  Subjective: No sign of active bleeding eating well one black small stool one tinge of left upper quadrant abdominal pain otherwise doing okay  Objective: Vital signs stable afebrile no acute distress abdomen soft nontender hemoglobin stable  Assessment: Resolved diverticular bleeding  Plan: Please let me know if I can assist any further this hospital stay and extreme care with resuming blood thinners  Jesse Lambert

## 2011-09-09 ENCOUNTER — Encounter: Payer: Medicare Other | Admitting: *Deleted

## 2011-09-12 ENCOUNTER — Ambulatory Visit (INDEPENDENT_AMBULATORY_CARE_PROVIDER_SITE_OTHER): Payer: Medicare Other | Admitting: Internal Medicine

## 2011-09-12 ENCOUNTER — Encounter: Payer: Self-pay | Admitting: Internal Medicine

## 2011-09-12 VITALS — BP 95/59 | HR 74 | Ht 69.0 in | Wt 198.4 lb

## 2011-09-12 DIAGNOSIS — I509 Heart failure, unspecified: Secondary | ICD-10-CM

## 2011-09-12 DIAGNOSIS — D62 Acute posthemorrhagic anemia: Secondary | ICD-10-CM

## 2011-09-12 DIAGNOSIS — I5032 Chronic diastolic (congestive) heart failure: Secondary | ICD-10-CM

## 2011-09-12 DIAGNOSIS — I4891 Unspecified atrial fibrillation: Secondary | ICD-10-CM

## 2011-09-12 NOTE — Patient Instructions (Signed)
Your physician recommends that you schedule a follow-up appointment in: 3 months with Lori Gerhardt, NP and 12 months with Dr Allred  

## 2011-09-12 NOTE — Assessment & Plan Note (Signed)
Stable No change required today  

## 2011-09-12 NOTE — Assessment & Plan Note (Signed)
As above Continue iron supplementation

## 2011-09-12 NOTE — Progress Notes (Signed)
PCP:DOOLITTLE, Harrel Lemon, MD  The patient presents today for routine electrophysiology followup.  He was recently hospitalized for GI bleeding.  He was found to have a diverticular bleed requiring PRBCs.  This occurred in the setting of an INR of 3.6 also while taking ASA.  He was evaluated by GI and bleeding resolved.  He has had no further episodes of bleeding since that time. He is unaware of symptoms with afib.  Today, he denies symptoms of palpitations, chest pain, orthopnea, PND, lower extremity edema, dizziness, presyncope, syncope, or neurologic sequela.  The patient feels that he is tolerating medications without difficulties and is otherwise without complaint today.   Past Medical History  Diagnosis Date  . Diabetes mellitus   . Atrial fibrillation     Permanent, Coumadin therapy  . HTN (hypertension)   . Chronic diastolic heart failure   . Anemia   . DM2 (diabetes mellitus, type 2)   . Dementia   . Non Hodgkin's lymphoma     B cell, high-grade  . Cancer    Past Surgical History  Procedure Date  . No past surgeries   . Esophagogastroduodenoscopy 08/18/2011    Procedure: ESOPHAGOGASTRODUODENOSCOPY (EGD);  Surgeon: Shirley Friar, MD;  Location: [facility];  Service: Endoscopy;  Laterality: N/A;  . Colonoscopy 08/20/2011    Procedure: COLONOSCOPY;  Surgeon: Graylin Shiver, MD;  Location: [facility];  Service: Endoscopy;  Laterality: N/A;    Current Outpatient Prescriptions  Medication Sig Dispense Refill  . atorvastatin (LIPITOR) 20 MG tablet Take 10 mg by mouth daily.      . bumetanide (BUMEX) 1 MG tablet Take 2 mg by mouth daily. Prescribed from [place] hospitalization      . Cyanocobalamin (VITAMIN B-12 CR PO) Take 1 tablet by mouth daily.       Marland Kitchen diltiazem (CARDIZEM CD) 120 MG 24 hr capsule Take 120 mg by mouth daily.      . ferrous sulfate 325 (65 FE) MG tablet Take 325 mg by mouth 2 (two) times daily.      Marland Kitchen lisinopril (PRINIVIL,ZESTRIL) 5 MG tablet Take 2.5 mg by  mouth daily.      . metoprolol succinate (TOPROL-XL) 100 MG 24 hr tablet Take 200 mg by mouth daily. Take two tablets Take with or immediately following a meal.      . Multiple Vitamin (MULTIVITAMIN WITH MINERALS) TABS Take 1 tablet by mouth daily.      . pantoprazole (PROTONIX) 40 MG tablet Take 1 tablet (40 mg total) by mouth 2 (two) times daily.  60 tablet  2  . Vitamin Mixture (VITAMIN E COMPLETE PO) Take 1 tablet by mouth daily.        No Known Allergies  History   Social History  . Marital Status: Married    Spouse Name: N/A    Number of Children: N/A  . Years of Education: N/A   Occupational History  . Not on file.   Social History Main Topics  . Smoking status: Former Smoker    Types: Cigarettes    Quit date: 07/28/1970  . Smokeless tobacco: Never Used  . Alcohol Use: No  . Drug Use: No  . Sexually Active: Not on file   Other Topics Concern  . Not on file   Social History Narrative   Married, retired from [employer], gets regular exercise.     Family History  Problem Relation Age of Onset  . Heart attack Brother     and sister  .  Diabetes Other     famiyl hx   Physical Exam: Filed Vitals:   09/12/11 1438  BP: 95/59  Pulse: 74  Height: 5\' 9"  (1.753 m)  Weight: 198 lb 6.4 oz (89.994 kg)    GEN- The patient is well appearing, alert and oriented x 3 today.   Head- normocephalic, atraumatic Eyes-  Sclera clear, conjunctiva pink Ears- hearing grossly decreased Oropharynx- clear Neck- supple, JVP 9cm Lymph- no cervical lymphadenopathy Lungs- CTAB , normal work of breathing Heart- irregular rate and rhythm, no murmurs, rubs or gallops, PMI not laterally displaced GI- soft, NT, ND, + BS Extremities- no clubbing, cyanosis, 2+ edema MS- no significant deformity or atrophy Skin- no rash or lesion Psych- euthymic mood, full affect Neuro- strength and sensation are intact  ekg today reveals afib, V rate 68 bpm, nonspecific ST/ T changes  Assessment and  Plan:

## 2011-09-12 NOTE — Assessment & Plan Note (Signed)
Well rate controlled Recently had a diverticular GI bleed in the setting of a supratherapeutic INR while on ASA also. I have reviewed his epic chart in detail. Given elevated risk of stroke, I think that we should restart anticoagulation as soon as possible. I will obtain a CBC today to assess his anemia. I think that we should wait until his hematocrit is 27 before restarting coumadin.  At that time, goal INR would be 2-2.5. He will not restart ASA.  He will return to the coumadin/ anticoagulation clinic in 1 week for further assement and to restart coumadin once hematocrit is 27.

## 2011-09-19 ENCOUNTER — Ambulatory Visit (INDEPENDENT_AMBULATORY_CARE_PROVIDER_SITE_OTHER): Payer: Medicare Other | Admitting: *Deleted

## 2011-09-19 DIAGNOSIS — C8589 Other specified types of non-Hodgkin lymphoma, extranodal and solid organ sites: Secondary | ICD-10-CM

## 2011-09-19 DIAGNOSIS — I4891 Unspecified atrial fibrillation: Secondary | ICD-10-CM

## 2011-09-19 DIAGNOSIS — Z7901 Long term (current) use of anticoagulants: Secondary | ICD-10-CM

## 2011-09-19 LAB — CBC WITH DIFFERENTIAL/PLATELET
Basophils Relative: 0.1 % (ref 0.0–3.0)
Eosinophils Relative: 2.6 % (ref 0.0–5.0)
HCT: 31.2 % — ABNORMAL LOW (ref 39.0–52.0)
Lymphs Abs: 1.5 10*3/uL (ref 0.7–4.0)
MCV: 85.9 fl (ref 78.0–100.0)
Monocytes Absolute: 0.6 10*3/uL (ref 0.1–1.0)
RBC: 3.63 Mil/uL — ABNORMAL LOW (ref 4.22–5.81)
WBC: 7 10*3/uL (ref 4.5–10.5)

## 2011-09-26 ENCOUNTER — Ambulatory Visit (INDEPENDENT_AMBULATORY_CARE_PROVIDER_SITE_OTHER): Payer: Medicare Other

## 2011-09-26 DIAGNOSIS — I4891 Unspecified atrial fibrillation: Secondary | ICD-10-CM

## 2011-09-26 DIAGNOSIS — Z7901 Long term (current) use of anticoagulants: Secondary | ICD-10-CM

## 2011-10-03 ENCOUNTER — Ambulatory Visit (INDEPENDENT_AMBULATORY_CARE_PROVIDER_SITE_OTHER): Payer: Medicare Other | Admitting: *Deleted

## 2011-10-03 DIAGNOSIS — Z7901 Long term (current) use of anticoagulants: Secondary | ICD-10-CM

## 2011-10-03 DIAGNOSIS — I4891 Unspecified atrial fibrillation: Secondary | ICD-10-CM

## 2011-10-24 ENCOUNTER — Ambulatory Visit (INDEPENDENT_AMBULATORY_CARE_PROVIDER_SITE_OTHER): Payer: Medicare Other

## 2011-10-24 DIAGNOSIS — Z7901 Long term (current) use of anticoagulants: Secondary | ICD-10-CM

## 2011-10-24 DIAGNOSIS — I4891 Unspecified atrial fibrillation: Secondary | ICD-10-CM

## 2011-11-01 ENCOUNTER — Telehealth: Payer: Self-pay | Admitting: Oncology

## 2011-11-01 NOTE — Telephone Encounter (Signed)
Pt's wife called to r/s the appts that were cancelled in aug due to the pt being in the hospital

## 2011-11-02 ENCOUNTER — Telehealth: Payer: Self-pay | Admitting: Oncology

## 2011-11-02 NOTE — Telephone Encounter (Signed)
S/w the pt's wife and she is aware of the change in the appt time for 11/03/2011

## 2011-11-03 ENCOUNTER — Other Ambulatory Visit (HOSPITAL_BASED_OUTPATIENT_CLINIC_OR_DEPARTMENT_OTHER): Payer: Medicare Other | Admitting: Oncology

## 2011-11-03 ENCOUNTER — Ambulatory Visit: Payer: Medicare Other | Admitting: Oncology

## 2011-11-03 ENCOUNTER — Other Ambulatory Visit: Payer: Self-pay | Admitting: *Deleted

## 2011-11-03 ENCOUNTER — Other Ambulatory Visit (HOSPITAL_BASED_OUTPATIENT_CLINIC_OR_DEPARTMENT_OTHER): Payer: Medicare Other

## 2011-11-03 ENCOUNTER — Telehealth: Payer: Self-pay | Admitting: *Deleted

## 2011-11-03 ENCOUNTER — Other Ambulatory Visit: Payer: Medicare Other | Admitting: Lab

## 2011-11-03 ENCOUNTER — Ambulatory Visit (HOSPITAL_BASED_OUTPATIENT_CLINIC_OR_DEPARTMENT_OTHER): Payer: Medicare Other | Admitting: Oncology

## 2011-11-03 VITALS — BP 118/77 | HR 86 | Temp 99.0°F | Resp 20 | Ht 69.0 in | Wt 206.2 lb

## 2011-11-03 DIAGNOSIS — C8590 Non-Hodgkin lymphoma, unspecified, unspecified site: Secondary | ICD-10-CM

## 2011-11-03 DIAGNOSIS — C8582 Other specified types of non-Hodgkin lymphoma, intrathoracic lymph nodes: Secondary | ICD-10-CM

## 2011-11-03 DIAGNOSIS — C8589 Other specified types of non-Hodgkin lymphoma, extranodal and solid organ sites: Secondary | ICD-10-CM

## 2011-11-03 DIAGNOSIS — I4891 Unspecified atrial fibrillation: Secondary | ICD-10-CM

## 2011-11-03 DIAGNOSIS — Z7901 Long term (current) use of anticoagulants: Secondary | ICD-10-CM

## 2011-11-03 LAB — CBC WITH DIFFERENTIAL/PLATELET
Basophils Absolute: 0 10*3/uL (ref 0.0–0.1)
Eosinophils Absolute: 0.1 10*3/uL (ref 0.0–0.5)
HGB: 12.8 g/dL — ABNORMAL LOW (ref 13.0–17.1)
MCV: 85.9 fL (ref 79.3–98.0)
MONO#: 0.7 10*3/uL (ref 0.1–0.9)
MONO%: 10.8 % (ref 0.0–14.0)
NEUT#: 3.9 10*3/uL (ref 1.5–6.5)
RDW: 19.3 % — ABNORMAL HIGH (ref 11.0–14.6)
WBC: 6.9 10*3/uL (ref 4.0–10.3)

## 2011-11-03 LAB — COMPREHENSIVE METABOLIC PANEL (CC13)
Albumin: 3.7 g/dL (ref 3.5–5.0)
Alkaline Phosphatase: 89 U/L (ref 40–150)
BUN: 19 mg/dL (ref 7.0–26.0)
Glucose: 129 mg/dl — ABNORMAL HIGH (ref 70–99)
Total Bilirubin: 0.67 mg/dL (ref 0.20–1.20)

## 2011-11-03 MED ORDER — VALACYCLOVIR HCL 500 MG PO TABS: 500.0000 mg | ORAL_TABLET | Freq: Two times a day (BID) | ORAL | Status: DC

## 2011-11-03 NOTE — Patient Instructions (Addendum)
You have shingles, begin valtrex  Twice a day for 7 days  i will see you back in 1 month in follow

## 2011-11-03 NOTE — Telephone Encounter (Signed)
Gave patient appointment for 11-30-2011  Gave patient appointment for scan in 03-2012

## 2011-11-07 ENCOUNTER — Ambulatory Visit (INDEPENDENT_AMBULATORY_CARE_PROVIDER_SITE_OTHER): Payer: Medicare Other

## 2011-11-07 DIAGNOSIS — Z7901 Long term (current) use of anticoagulants: Secondary | ICD-10-CM

## 2011-11-07 DIAGNOSIS — I4891 Unspecified atrial fibrillation: Secondary | ICD-10-CM

## 2011-11-07 LAB — POCT INR: INR: 3

## 2011-11-14 NOTE — Progress Notes (Signed)
OFFICE PROGRESS NOTE  CC  DOOLITTLE, Harrel Lemon, MD [address] [place] [address] Dr. Hillis Range Thomasenia Bottoms PA  DIAGNOSIS: 78 year old male high grade non Hodgkin Lymphoma diagnosed in June 2010  PRIOR THERAPY:  1. Radiation to the thoracic spine and adjacent areas of involvement from 06/17/08 - 07/15/08  2. CHOP_R x 6 cycles 08/07/08 - 11/12/08  CURRENT THERAPY:observation  INTERVAL HISTORY: Jesse Lambert 78 y.o. male returns for follow up visit. Overall he is doing well. He is receiving coumadin because of an irregular heartbeat. He has not any fevers or chills or night sweats, no pain in the back , no difficulty walking. Bottom of his feet is still numb, it comes and goes. He has had a little bit of loss of hearing.No bleeding or bruising.Remainder of the 10 point  review of systems is negative.  MEDICAL HISTORY: Past Medical History  Diagnosis Date  . Diabetes mellitus   . Atrial fibrillation     Permanent, Coumadin therapy  . HTN (hypertension)   . Chronic diastolic heart failure   . Anemia   . DM2 (diabetes mellitus, type 2)   . Dementia   . Non Hodgkin's lymphoma     B cell, high-grade  . Cancer     ALLERGIES:   has no known allergies.  MEDICATIONS:  Current Outpatient Prescriptions  Medication Sig Dispense Refill  . atorvastatin (LIPITOR) 20 MG tablet Take 10 mg by mouth daily.      . bumetanide (BUMEX) 1 MG tablet Take 2 mg by mouth daily. Prescribed from [place] hospitalization      . Cyanocobalamin (VITAMIN B-12 CR PO) Take 1 tablet by mouth daily.       Marland Kitchen diltiazem (CARDIZEM CD) 120 MG 24 hr capsule Take 120 mg by mouth daily.      . ferrous sulfate 325 (65 FE) MG tablet Take 325 mg by mouth 2 (two) times daily.      Marland Kitchen lisinopril (PRINIVIL,ZESTRIL) 5 MG tablet Take 2.5 mg by mouth daily.      . metoprolol succinate (TOPROL-XL) 100 MG 24 hr tablet Take 200 mg by mouth daily. Take two tablets Take with or immediately following a meal.      .  Multiple Vitamin (MULTIVITAMIN WITH MINERALS) TABS Take 1 tablet by mouth daily.      . pantoprazole (PROTONIX) 40 MG tablet Take 1 tablet (40 mg total) by mouth 2 (two) times daily.  60 tablet  2  . Vitamin Mixture (VITAMIN E COMPLETE PO) Take 1 tablet by mouth daily.      . valACYclovir (VALTREX) 500 MG tablet Take 1 tablet (500 mg total) by mouth 2 (two) times daily.  14 tablet  0    SURGICAL HISTORY:  Past Surgical History  Procedure Date  . No past surgeries   . Esophagogastroduodenoscopy 08/18/2011    Procedure: ESOPHAGOGASTRODUODENOSCOPY (EGD);  Surgeon: Shirley Friar, MD;  Location: [facility];  Service: Endoscopy;  Laterality: N/A;  . Colonoscopy 08/20/2011    Procedure: COLONOSCOPY;  Surgeon: Graylin Shiver, MD;  Location: [facility];  Service: Endoscopy;  Laterality: N/A;    REVIEW OF SYSTEMS:  Pertinent items are noted in HPI.   PHYSICAL EXAMINATION: Head: Normocephalic, without obvious abnormality, atraumatic Neck: no adenopathy, no carotid bruit, no JVD, supple, symmetrical, trachea midline and thyroid not enlarged, symmetric, no tenderness/mass/nodules Lymph nodes: Cervical, supraclavicular, and axillary nodes normal. Resp: clear to auscultation bilaterally and normal percussion bilaterally Back: symmetric, no  curvature. ROM normal. No CVA tenderness. Cardio: irregularly irregular rhythm GI: soft, non-tender; bowel sounds normal; no masses,  no organomegaly Extremities: extremities normal, atraumatic, no cyanosis or edema Neurologic: Alert and oriented X 3, normal strength and tone. Normal symmetric reflexes. Normal coordination and gait  ECOG PERFORMANCE STATUS: 1 - Symptomatic but completely ambulatory  Blood pressure 118/77, pulse 86, temperature 99 F (37.2 C), temperature source Oral, resp. rate 20, height 5\' 9"  (1.753 m), weight 206 lb 3.2 oz (93.532 kg).  LABORATORY DATA: Lab Results  Component Value Date   WBC 6.9 11/03/2011   HGB 12.8* 11/03/2011    HCT 39.5 11/03/2011   MCV 85.9 11/03/2011   PLT 194 11/03/2011      Chemistry      Component Value Date/Time   NA 136 11/03/2011 1209   NA 139 09/03/2011 0404   NA 147* 01/24/2011 0830   K 3.7 11/03/2011 1209   K 4.2 09/03/2011 0404   K 4.2 01/24/2011 0830   CL 100 11/03/2011 1209   CL 108 09/03/2011 0404   CL 105 01/24/2011 0830   CO2 29 11/03/2011 1209   CO2 23 09/03/2011 0404   CO2 30 01/24/2011 0830   BUN 19.0 11/03/2011 1209   BUN 32* 09/03/2011 0404   BUN 19 01/24/2011 0830   CREATININE 1.5* 11/03/2011 1209   CREATININE 1.43* 09/03/2011 0404   CREATININE 1.4* 01/24/2011 0830      Component Value Date/Time   CALCIUM 8.9 11/03/2011 1209   CALCIUM 8.0* 09/03/2011 0404   CALCIUM 9.5 01/24/2011 0830   ALKPHOS 89 11/03/2011 1209   ALKPHOS 58 09/01/2011 1450   ALKPHOS 74 01/24/2011 0830   AST 28 11/03/2011 1209   AST 16 09/01/2011 1450   AST 22 01/24/2011 0830   ALT 22 11/03/2011 1209   ALT 11 09/01/2011 1450   BILITOT 0.67 11/03/2011 1209   BILITOT 0.5 09/01/2011 1450   BILITOT 1.00 01/24/2011 0830       RADIOGRAPHIC STUDIES:   ASSESSMENT: 78 year old male with :  1. Non Hodgkin Lymphoma near the thoracic spine patient recived radiation followed by chemotherapy CHOP-R NED  #2 atrial fibrillation patient is on long-term anticoagulation his INRs are being checked at his cardiologist's office.    PLAN:   1. Follow up in 6 months with labs  All questions were answered. The patient knows to call the clinic with any problems, questions or concerns. We can certainly see the patient much sooner if necessary.  I spent 25 minutes counseling the patient face to face. The total time spent in the appointment was 30 minutes.    Drue Second, MD Medical/Oncology [facility] [phone] (beeper) [phone] (Office)  11/14/2011, 12:11 PM

## 2011-11-21 ENCOUNTER — Inpatient Hospital Stay (HOSPITAL_COMMUNITY)
Admission: EM | Admit: 2011-11-21 | Discharge: 2011-11-24 | DRG: 378 | Disposition: A | Discharge status: Home / Self Care | Payer: Medicare Other | Attending: Internal Medicine | Admitting: Internal Medicine

## 2011-11-21 ENCOUNTER — Ambulatory Visit (INDEPENDENT_AMBULATORY_CARE_PROVIDER_SITE_OTHER): Payer: Medicare Other | Admitting: *Deleted

## 2011-11-21 ENCOUNTER — Encounter (HOSPITAL_COMMUNITY): Payer: Self-pay

## 2011-11-21 DIAGNOSIS — I509 Heart failure, unspecified: Secondary | ICD-10-CM | POA: Diagnosis present

## 2011-11-21 DIAGNOSIS — I9589 Other hypotension: Secondary | ICD-10-CM | POA: Diagnosis present

## 2011-11-21 DIAGNOSIS — I4891 Unspecified atrial fibrillation: Secondary | ICD-10-CM

## 2011-11-21 DIAGNOSIS — Z7901 Long term (current) use of anticoagulants: Secondary | ICD-10-CM

## 2011-11-21 DIAGNOSIS — I959 Hypotension, unspecified: Secondary | ICD-10-CM

## 2011-11-21 DIAGNOSIS — K922 Gastrointestinal hemorrhage, unspecified: Secondary | ICD-10-CM

## 2011-11-21 DIAGNOSIS — K219 Gastro-esophageal reflux disease without esophagitis: Secondary | ICD-10-CM | POA: Diagnosis present

## 2011-11-21 DIAGNOSIS — K921 Melena: Principal | ICD-10-CM | POA: Diagnosis present

## 2011-11-21 DIAGNOSIS — T45515A Adverse effect of anticoagulants, initial encounter: Secondary | ICD-10-CM | POA: Diagnosis present

## 2011-11-21 DIAGNOSIS — C8589 Other specified types of non-Hodgkin lymphoma, extranodal and solid organ sites: Secondary | ICD-10-CM | POA: Diagnosis present

## 2011-11-21 DIAGNOSIS — D62 Acute posthemorrhagic anemia: Secondary | ICD-10-CM

## 2011-11-21 DIAGNOSIS — R791 Abnormal coagulation profile: Secondary | ICD-10-CM | POA: Diagnosis present

## 2011-11-21 DIAGNOSIS — E119 Type 2 diabetes mellitus without complications: Secondary | ICD-10-CM | POA: Diagnosis present

## 2011-11-21 DIAGNOSIS — I129 Hypertensive chronic kidney disease with stage 1 through stage 4 chronic kidney disease, or unspecified chronic kidney disease: Secondary | ICD-10-CM | POA: Diagnosis present

## 2011-11-21 DIAGNOSIS — N189 Chronic kidney disease, unspecified: Secondary | ICD-10-CM | POA: Diagnosis present

## 2011-11-21 DIAGNOSIS — I5032 Chronic diastolic (congestive) heart failure: Secondary | ICD-10-CM | POA: Diagnosis present

## 2011-11-21 DIAGNOSIS — N179 Acute kidney failure, unspecified: Secondary | ICD-10-CM

## 2011-11-21 LAB — COMPREHENSIVE METABOLIC PANEL
ALT: 12 U/L (ref 0–53)
AST: 21 U/L (ref 0–37)
CO2: 26 mEq/L (ref 19–32)
Chloride: 101 mEq/L (ref 96–112)
Creatinine, Ser: 2.22 mg/dL — ABNORMAL HIGH (ref 0.50–1.35)
GFR calc Af Amer: 31 mL/min — ABNORMAL LOW (ref >90)
GFR calc non Af Amer: 27 mL/min — ABNORMAL LOW (ref >90)
Glucose, Bld: 163 mg/dL — ABNORMAL HIGH (ref 70–99)
Sodium: 138 mEq/L (ref 135–145)
Total Bilirubin: 0.3 mg/dL (ref 0.3–1.2)

## 2011-11-21 LAB — CBC WITH DIFFERENTIAL/PLATELET
Basophils Absolute: 0 10*3/uL (ref 0.0–0.1)
HCT: 32.9 % — ABNORMAL LOW (ref 39.0–52.0)
Lymphocytes Relative: 17 % (ref 12–46)
Lymphs Abs: 1.5 10*3/uL (ref 0.7–4.0)
MCV: 84.4 fL (ref 78.0–100.0)
Monocytes Absolute: 0.8 10*3/uL (ref 0.1–1.0)
Neutro Abs: 6.1 10*3/uL (ref 1.7–7.7)
RBC: 3.9 MIL/uL — ABNORMAL LOW (ref 4.22–5.81)
RDW: 18.1 % — ABNORMAL HIGH (ref 11.5–15.5)
WBC: 8.7 10*3/uL (ref 4.0–10.5)

## 2011-11-21 LAB — PROTIME-INR
INR: 1.49 (ref 0.00–1.49)
INR: 1.54 — ABNORMAL HIGH (ref 0.00–1.49)
Prothrombin Time: 17.6 seconds — ABNORMAL HIGH (ref 11.6–15.2)

## 2011-11-21 LAB — OCCULT BLOOD, POC DEVICE: Fecal Occult Bld: POSITIVE

## 2011-11-21 LAB — HEMOGLOBIN AND HEMATOCRIT, BLOOD: HCT: 30.2 % — ABNORMAL LOW (ref 39.0–52.0)

## 2011-11-21 LAB — MRSA PCR SCREENING: MRSA by PCR: NEGATIVE

## 2011-11-21 LAB — PREPARE RBC (CROSSMATCH)

## 2011-11-21 MED ORDER — ONDANSETRON HCL 4 MG PO TABS: 4.0000 mg | ORAL_TABLET | Freq: Four times a day (QID) | ORAL | Status: DC | PRN

## 2011-11-21 MED ORDER — METOPROLOL SUCCINATE ER 100 MG PO TB24
100.0000 mg | ORAL_TABLET | Freq: Every day | ORAL | Status: DC
  Administered 2011-11-22 – 2011-11-24 (×3): 100 mg via ORAL
  Filled 2011-11-21 (×3): qty 1

## 2011-11-21 MED ORDER — SODIUM CHLORIDE 0.9 % IV SOLN
1000.0000 mL | INTRAVENOUS | Status: DC
  Administered 2011-11-21 – 2011-11-23 (×5): 1000 mL via INTRAVENOUS

## 2011-11-21 MED ORDER — VITAMIN K1 10 MG/ML IJ SOLN
10.0000 mg | INTRAVENOUS | Status: AC
  Administered 2011-11-21: 10 mg via INTRAVENOUS
  Filled 2011-11-21: qty 1

## 2011-11-21 MED ORDER — SODIUM CHLORIDE 0.9 % IV SOLN
80.0000 mg | Freq: Once | INTRAVENOUS | Status: AC
  Administered 2011-11-21: 80 mg via INTRAVENOUS
  Filled 2011-11-21: qty 80

## 2011-11-21 MED ORDER — SODIUM CHLORIDE 0.9 % IV SOLN
8.0000 mg/h | INTRAVENOUS | Status: DC
  Administered 2011-11-21 – 2011-11-22 (×2): 8 mg/h via INTRAVENOUS
  Filled 2011-11-21 (×4): qty 80

## 2011-11-21 MED ORDER — VITAMIN K1 10 MG/ML IJ SOLN: 10.0000 mg | Freq: Once | INTRAVENOUS | Status: DC

## 2011-11-21 MED ORDER — VALACYCLOVIR HCL 500 MG PO TABS
500.0000 mg | ORAL_TABLET | Freq: Two times a day (BID) | ORAL | Status: DC
  Administered 2011-11-21 – 2011-11-24 (×6): 500 mg via ORAL
  Filled 2011-11-21 (×8): qty 1

## 2011-11-21 MED ORDER — ATORVASTATIN CALCIUM 10 MG PO TABS
10.0000 mg | ORAL_TABLET | Freq: Every day | ORAL | Status: DC
  Administered 2011-11-22 – 2011-11-23 (×2): 10 mg via ORAL
  Filled 2011-11-21 (×3): qty 1

## 2011-11-21 MED ORDER — SODIUM CHLORIDE 0.9 % IV SOLN
20.0000 mL | INTRAVENOUS | Status: DC
  Administered 2011-11-21: 500 mL via INTRAVENOUS
  Administered 2011-11-23 (×2): 20 mL via INTRAVENOUS

## 2011-11-21 MED ORDER — ANTIINHIBITOR COAGULANT CMPLX IV SOLR
539.0000 [IU] | INTRAVENOUS | Status: AC
  Administered 2011-11-21: 539 [IU] via INTRAVENOUS
  Filled 2011-11-21: qty 539

## 2011-11-21 MED ORDER — SODIUM CHLORIDE 0.9 % IV SOLN
1000.0000 mL | Freq: Once | INTRAVENOUS | Status: AC
  Administered 2011-11-21: 1000 mL via INTRAVENOUS

## 2011-11-21 MED ORDER — ANTIINHIBITOR COAGULANT CMPLX IV SOLR: 500.0000 [IU] | Freq: Once | INTRAVENOUS | Status: DC | PRN

## 2011-11-21 MED ORDER — ONDANSETRON HCL 4 MG/2ML IJ SOLN
4.0000 mg | Freq: Four times a day (QID) | INTRAMUSCULAR | Status: DC | PRN
  Administered 2011-11-23: 4 mg via INTRAVENOUS
  Filled 2011-11-21: qty 2

## 2011-11-21 MED ORDER — SODIUM CHLORIDE 0.9 % IJ SOLN
3.0000 mL | Freq: Two times a day (BID) | INTRAMUSCULAR | Status: DC
  Administered 2011-11-21 – 2011-11-23 (×5): 3 mL via INTRAVENOUS

## 2011-11-21 NOTE — H&P (Signed)
. [facility] History and Physical  Jesse Lambert ZOX:[id number] DOB: [birth year] DOA: 11/21/2011  Referring physician: Dr Rosalia Hammers PCP: Tonye Pearson, MD  Specialists: DR Ewing Schlein  Chief Complaint:   HPI: Jesse Lambert is a 78 y.o. male with multiple medical problems including , HTN, DM, atrial firbillation on coumadin, with recent hopsitalization for gi bleed comes in for lightheadedness, and dizziness and was found to be hypotensive and anemic when compared to the last visit. Patient also reports black stools and he was found to be positive for occult blood in stool. He denies any complaints other than the dizziness which has resolved. He is admitted to hospitalist service to step down for GI bleed from possible coumadin. He also reports a recent fall at home with bruise on the right cheek bone and around the right eye,   Review of Systems: The patient denies anorexia, fever, weight loss,, vision loss, decreased hearing, hoarseness, chest pain, syncope, dyspnea on exertion, peripheral edema, balance deficits, hemoptysis, abdominal pain, melena, hematochezia, severe indigestion/heartburn, hematuria, incontinence, genital sores, muscle weakness, suspicious skin lesions, transient blindness, difficulty walking, depression, unusual weight change, abnormal bleeding, enlarged lymph nodes, angioedema, and breast masses.    Past Medical History  Diagnosis Date  . Diabetes mellitus   . Atrial fibrillation     Permanent, Coumadin therapy  . HTN (hypertension)   . Chronic diastolic heart failure   . Anemia   . DM2 (diabetes mellitus, type 2)   . Dementia   . Non Hodgkin's lymphoma     B cell, high-grade  . Cancer    Past Surgical History  Procedure Date  . No past surgeries   . Esophagogastroduodenoscopy 08/18/2011    Procedure: ESOPHAGOGASTRODUODENOSCOPY (EGD);  Surgeon: Shirley Friar, MD;  Location: [facility];  Service: Endoscopy;  Laterality: N/A;  . Colonoscopy  08/20/2011    Procedure: COLONOSCOPY;  Surgeon: Graylin Shiver, MD;  Location: [facility];  Service: Endoscopy;  Laterality: N/A;  . Coronary stent placement    Social History:  reports that he quit smoking about 41 years ago. His smoking use included Cigarettes. He has never used smokeless tobacco. He reports that he does not drink alcohol or use illicit drugs.  where does patient live--home, Can patient participate in ADLs?  No Known Allergies  Family History  Problem Relation Age of Onset  . Heart attack Brother     and sister  . Diabetes Other     famiyl hx     Prior to Admission medications   Medication Sig Start Date End Date Taking? Authorizing Provider  atorvastatin (LIPITOR) 20 MG tablet Take 10 mg by mouth daily.   Yes Historical Provider, MD  bumetanide (BUMEX) 1 MG tablet Take 2 mg by mouth daily. Prescribed from [place] hospitalization 07/24/11  Yes Historical Provider, MD  Cyanocobalamin (VITAMIN B-12 CR PO) Take 1 tablet by mouth daily.    Yes Historical Provider, MD  diltiazem (CARDIZEM CD) 120 MG 24 hr capsule Take 120 mg by mouth daily.   Yes Historical Provider, MD  ferrous sulfate 325 (65 FE) MG tablet Take 325 mg by mouth 2 (two) times daily.   Yes Historical Provider, MD  glipiZIDE (GLUCOTROL) 5 MG tablet Take 5 mg by mouth 2 (two) times daily before a meal.   Yes Historical Provider, MD  lisinopril (PRINIVIL,ZESTRIL) 5 MG tablet Take 2.5 mg by mouth daily.   Yes Historical Provider, MD  metoprolol succinate (TOPROL-XL) 100 MG 24 hr tablet Take  100 mg by mouth daily.    Yes Historical Provider, MD  Multiple Vitamin (MULTIVITAMIN WITH MINERALS) TABS Take 1 tablet by mouth daily.   Yes Historical Provider, MD  omeprazole (PRILOSEC) 20 MG capsule Take 20 mg by mouth 2 (two) times daily before a meal.   Yes Historical Provider, MD  pantoprazole (PROTONIX) 40 MG tablet Take 40 mg by mouth 2 (two) times daily before a meal.   Yes Historical Provider, MD  valACYclovir  (VALTREX) 500 MG tablet Take 1 tablet (500 mg total) by mouth 2 (two) times daily. 11/03/11  Yes Victorino December, MD  warfarin (COUMADIN) 5 MG tablet Take 2.5-5 mg by mouth daily. 2.5mg  mondays, 5mg  all other days   Yes Historical Provider, MD   Physical Exam: Filed Vitals:   11/21/11 1630 11/21/11 1800 11/21/11 1915 11/21/11 2000  BP: 104/58 105/71 108/63   Pulse: 72 90 70   Temp:  98.5 F (36.9 C)  97.6 F (36.4 C)  TempSrc:    Oral  Resp: 15 20 22    SpO2: 98% 96% 99%     Constitutional: Vital signs reviewed.  Patient is a well-developed and well-nourished  in no acute distress and cooperative with exam. Alert and oriented x3. Bruise on the right cheek bone and right eye. Head: Normocephalic and atraumatic Mouth: no erythema or exudates, MMM Eyes: PERRL, EOMI, conjunctivae normal, No scleral icterus.  Neck: Supple, Trachea midline normal ROM, No JVD, mass, thyromegaly, or carotid bruit present.  Cardiovascular: RRR, S1 normal, S2 normal, no MRG, pulses symmetric and intact bilaterally Pulmonary/Chest: CTAB, no wheezes, rales, or rhonchi Abdominal: Soft. Non-tender, non-distended, bowel sounds are normal, no masses, organomegaly, or guarding present.  Musculoskeletal: No joint deformities, erythema, or stiffness, ROM full and no nontender Hematology: no cervical, inginal, or axillary adenopathy.  Neurological: A&O x3, Strength is normal and symmetric bilaterally, cranial nerve II-XII are grossly intact, no focal motor deficit, sensory intact to light touch bilaterally.  Skin: Warm, dry and intact. No rash, cyanosis, or clubbing.  Psychiatric: Normal mood and affect. speech and behavior is normal.  Labs on Admission:  Basic Metabolic Panel:  Lab 11/21/11 4098  NA 138  K 4.1  CL 101  CO2 26  GLUCOSE 163*  BUN 52*  CREATININE 2.22*  CALCIUM 9.1  MG --  PHOS --   Liver Function Tests:  Lab 11/21/11 1225  AST 21  ALT 12  ALKPHOS 69  BILITOT 0.3  PROT 7.3  ALBUMIN 3.6     No results found for this basename: LIPASE:5,AMYLASE:5 in the last 168 hours No results found for this basename: AMMONIA:5 in the last 168 hours CBC:  Lab 11/21/11 1824 11/21/11 1225  WBC -- 8.7  NEUTROABS -- 6.1  HGB 9.9* 10.8*  HCT 30.2* 32.9*  MCV -- 84.4  PLT -- 190   Cardiac Enzymes: No results found for this basename: CKTOTAL:5,CKMB:5,CKMBINDEX:5,TROPONINI:5 in the last 168 hours  BNP (last 3 results)  Basename 08/18/11 1010  PROBNP 2199.0*   CBG: No results found for this basename: GLUCAP:5 in the last 168 hours  Radiological Exams on Admission: No results found.  EKG:   Assessment/Plan Active Problems:    1. GI bleed - admit to step down.  - clear liquid diet - Q8hr H&H - probably a diverticular bleed from old records vs microscopic bleed from the colon with coumadin on board.  - his coumadin has been held and he underwent reversal protocol.  - his INR was  2.4 on admission and on reversal it is 1.4 - IV protonix, clear liquid - IV fluids - orthostatics - GI consult called - 2 units of prbc ordered by ED physician.   2. Hypotension: probably from dehydration - resolved with fluds  3. Atrial fibrillation: rate controlled on metoprolol, cardizem. Anticoagulation held for gi bleed.   4. Acute on chronic renal failure: probably from dehydration and gi bleed.  Gentle hydration and repeat labs in am.    5. DVT Prophylaxis SCD'S/    Code Status: FULL CODE Family Communication: wife at bedside Disposition Plan: 2 to 3 days.    [facility] Pager [phone]  If 7PM-7AM, please contact night-coverage [email or web address] Password [facility] 11/21/2011, 8:33 PM

## 2011-11-21 NOTE — Progress Notes (Signed)
eLink Physician-Brief Progress Note Patient Name: Jesse Lambert DOB: 1932-12-01 MRN: [id number]  Date of Service  11/21/2011   HPI/Events of Note  H/o GI bleed with peptic ulcer on previous Endoscopy 8/13, comes in with GI bleed, on anticoagulation   eICU Interventions  Initiate protonix drip       Delayna Sparlin 11/21/2011, 6:16 PM

## 2011-11-21 NOTE — ED Provider Notes (Signed)
History     CSN: [id number]  Arrival date & time 11/21/11  1120   First MD Initiated Contact with Patient 11/21/11 1150      Chief Complaint  Patient presents with  . Hypotension    (Consider location/radiation/quality/duration/timing/severity/associated sxs/prior treatment) HPI  Patient became light headed today leaving coumading clinic.  Patient with history of gi bleed and hypotension with hospitalization and work up in August.  Patient resumed coumadin as instructed after discharge.  He has been doing okay until today except for pain on right back/flank from shingles.  Patient states he has had some dark stools for "a good while.".  No vomiting of blood, abdominal pain but thinks from shingles.  No chest pain, some dyspnea but that comes and goes and not having now.  Taking po ok no weight loss.  PMD Dr. Merla Riches, [place] [place]-   Coumadin inr today 2.5.  Card- [facility]  Discharge summary from 08/31/11  Active Problems:   NON-HODGKIN'S LYMPHOMA   Hypotension with a history of hypertension   Atrial fibrillation   Chronic diastolic congestive heart failure   AKI (acute kidney injury)   GI bleed   Thrombocytopenia    Past Medical History  Diagnosis Date  . Diabetes mellitus   . Atrial fibrillation     Permanent, Coumadin therapy  . HTN (hypertension)   . Chronic diastolic heart failure   . Anemia   . DM2 (diabetes mellitus, type 2)   . Dementia   . Non Hodgkin's lymphoma     B cell, high-grade  . Cancer     Past Surgical History  Procedure Date  . No past surgeries   . Esophagogastroduodenoscopy 08/18/2011    Procedure: ESOPHAGOGASTRODUODENOSCOPY (EGD);  Surgeon: Shirley Friar, MD;  Location: [facility];  Service: Endoscopy;  Laterality: N/A;  . Colonoscopy 08/20/2011    Procedure: COLONOSCOPY;  Surgeon: Graylin Shiver, MD;  Location: [facility];  Service: Endoscopy;  Laterality: N/A;  . Coronary stent placement     Family History  Problem Relation Age  of Onset  . Heart attack Brother     and sister  . Diabetes Other     famiyl hx    History  Substance Use Topics  . Smoking status: Former Smoker    Types: Cigarettes    Quit date: 07/28/1970  . Smokeless tobacco: Never Used  . Alcohol Use: No   This is from discharge summary 08/31/2011 Acute blood loss anemia secondary to lower GI bleeding from diverticulosis in the setting of a supratherapeutic INR.   Thought to be secondary to diverticular bleeding in the setting of a supratherapeutic INR.     The patient was given vitamin K on admission, and 2 units of FFP on 09/01/11 to reverse his INR.     He is status post 4 units of packed red blood cells.     Dr. Randa Evens of GI saw him in consultation 09/01/11, and recommended a tagged RBC scan, and to hold coumadin for at least 1 week. The tagged RBC scan showed the source of the bleed as being within the splenic flexure of the colon.     Tolerated advancement of diet.     H&H stable x 48 hours prior to discharge.   Per GI: Hold coumadin as long as possible and consider all blood thinner options. If re-bleeding occurs, consider angiogram or an elective colectomy.   Review of Systems  Constitutional: Negative for fever and chills.  HENT: Negative for neck  stiffness.   Eyes: Negative for visual disturbance.  Respiratory: Positive for shortness of breath.   Cardiovascular: Negative for chest pain.  Gastrointestinal: Negative for vomiting, diarrhea and blood in stool.  Genitourinary: Negative for dysuria, frequency and decreased urine volume.  Musculoskeletal: Negative for myalgias and joint swelling.       Patient states tripped and fell on last Thursday- denies loc, headache or lateralized deficit  Skin: Negative for rash.  Neurological: Negative for weakness.  Hematological: Negative for adenopathy.  Psychiatric/Behavioral: Negative for agitation.    Allergies  Review of patient's allergies indicates no known allergies.  Home  Medications   Current Outpatient Rx  Name  Route  Sig  Dispense  Refill  . ATORVASTATIN CALCIUM 20 MG PO TABS   Oral   Take 10 mg by mouth daily.         . BUMETANIDE 1 MG PO TABS   Oral   Take 2 mg by mouth daily. Prescribed from [place] hospitalization         . VITAMIN B-12 CR PO   Oral   Take 1 tablet by mouth daily.          Marland Kitchen DILTIAZEM HCL ER COATED BEADS 120 MG PO CP24   Oral   Take 120 mg by mouth daily.         Marland Kitchen FERROUS SULFATE 325 (65 FE) MG PO TABS   Oral   Take 325 mg by mouth 2 (two) times daily.         Marland Kitchen LISINOPRIL 5 MG PO TABS   Oral   Take 2.5 mg by mouth daily.         Marland Kitchen METOPROLOL SUCCINATE ER 100 MG PO TB24   Oral   Take 200 mg by mouth daily. Take two tablets Take with or immediately following a meal.         . ADULT MULTIVITAMIN W/MINERALS CH   Oral   Take 1 tablet by mouth daily.         Marland Kitchen PANTOPRAZOLE SODIUM 40 MG PO TBEC   Oral   Take 1 tablet (40 mg total) by mouth 2 (two) times daily.   60 tablet   2   . VALACYCLOVIR HCL 500 MG PO TABS   Oral   Take 1 tablet (500 mg total) by mouth 2 (two) times daily.   14 tablet   0   . VITAMIN E COMPLETE PO   Oral   Take 1 tablet by mouth daily.           BP 96/65  Pulse 85  Temp 97.8 F (36.6 C) (Oral)  Resp 13  SpO2 100%  Physical Exam  Nursing note and vitals reviewed. Constitutional: He is oriented to person, place, and time. He appears well-developed and well-nourished.  HENT:  Head: Normocephalic.  Right Ear: External ear normal.       Right periorbital contusion  Eyes: Pupils are equal, round, and reactive to light.       Conjunctiva pale  Neck: Normal range of motion. Neck supple.  Cardiovascular: Normal rate and regular rhythm.   Pulmonary/Chest: Effort normal and breath sounds normal.  Abdominal: Soft. Bowel sounds are normal.  Genitourinary: Prostate normal. Guaiac positive stool.  Musculoskeletal: Normal range of motion.  Neurological: He is alert and  oriented to person, place, and time. He has normal reflexes.  Skin: Skin is warm and dry.  Psychiatric: He has a normal mood and affect. His behavior is  normal. Judgment and thought content normal.    ED Course  Procedures (including critical care time)  Labs Reviewed - No data to display No results found.   No diagnosis found.  Results for orders placed during the hospital encounter of 11/21/11  CBC WITH DIFFERENTIAL      Component Value Range   WBC 8.7  4.0 - 10.5 K/uL   RBC 3.90 (*) 4.22 - 5.81 MIL/uL   Hemoglobin 10.8 (*) 13.0 - 17.0 g/dL   HCT 54.0 (*) 98.1 - 19.1 %   MCV 84.4  78.0 - 100.0 fL   MCH 27.7  26.0 - 34.0 pg   MCHC 32.8  30.0 - 36.0 g/dL   RDW 47.8 (*) 29.5 - 62.1 %   Platelets 190  150 - 400 K/uL   Neutrophils Relative 70  43 - 77 %   Neutro Abs 6.1  1.7 - 7.7 K/uL   Lymphocytes Relative 17  12 - 46 %   Lymphs Abs 1.5  0.7 - 4.0 K/uL   Monocytes Relative 9  3 - 12 %   Monocytes Absolute 0.8  0.1 - 1.0 K/uL   Eosinophils Relative 3  0 - 5 %   Eosinophils Absolute 0.3  0.0 - 0.7 K/uL   Basophils Relative 0  0 - 1 %   Basophils Absolute 0.0  0.0 - 0.1 K/uL  COMPREHENSIVE METABOLIC PANEL      Component Value Range   Sodium 138  135 - 145 mEq/L   Potassium 4.1  3.5 - 5.1 mEq/L   Chloride 101  96 - 112 mEq/L   CO2 26  19 - 32 mEq/L   Glucose, Bld 163 (*) 70 - 99 mg/dL   BUN 52 (*) 6 - 23 mg/dL   Creatinine, Ser 3.08 (*) 0.50 - 1.35 mg/dL   Calcium 9.1  8.4 - 65.7 mg/dL   Total Protein 7.3  6.0 - 8.3 g/dL   Albumin 3.6  3.5 - 5.2 g/dL   AST 21  0 - 37 U/L   ALT 12  0 - 53 U/L   Alkaline Phosphatase 69  39 - 117 U/L   Total Bilirubin 0.3  0.3 - 1.2 mg/dL   GFR calc non Af Amer 27 (*) >90 mL/min   GFR calc Af Amer 31 (*) >90 mL/min  PROTIME-INR      Component Value Range   Prothrombin Time 25.1 (*) 11.6 - 15.2 seconds   INR 2.41 (*) 0.00 - 1.49  TYPE AND SCREEN      Component Value Range   ABO/RH(D) O NEG     Antibody Screen NEG     Sample  Expiration 11/24/2011    OCCULT BLOOD, POC DEVICE      Component Value Range   Fecal Occult Bld POSITIVE     BP 111/71  Pulse 78  Temp 97.8 F (36.6 C) (Oral)  Resp 25  SpO2 98%   Date: 11/21/2011  Rate: 74  Rhythm:atrial fibrillation  QRS Axis: normal  Intervals: a fib  ST/T Wave abnormalities: t wave inversion  Conduction Disutrbances:t wave inversion  Narrative Interpretation:   Old EKG Reviewed: unchanged   MDM  With low blood pressure at 85/46 now improved after 1 L of normal saline. It had a heme-positive stool tree of GI bleeding. He is anti-coag and related on Coumadin his INR is reported to be 2.4 prior to admission. Anticoagulations reversal protocol is initiated.  Patient with history of gi bleed with -  please see pmh with discharge info from 08/31/2011.  Patient with bp increased to 111/71 after one liter ns.  Patient with increased bun and creatinine with probable volume contracture compounding gi bleed symptoms and suspect will have worsening anemia with saline expansion.  Two units prbc ordered now.  Plan consult with hospitalist and admit probable step down.   CRITICAL CARE Performed by: Hilario Quarry   Total critical care time: 45  Critical care time was exclusive of separately billable procedures and treating other patients.  Critical care was necessary to treat or prevent imminent or life-threatening deterioration.  Critical care was time spent personally by me on the following activities: development of treatment plan with patient and/or surrogate as well as nursing, discussions with consultants, evaluation of patient's response to treatment, examination of patient, obtaining history from patient or surrogate, ordering and performing treatments and interventions, ordering and review of laboratory studies, ordering and review of radiographic studies, pulse oximetry and re-evaluation of patient's condition.      Patient care discussed with Dr. Blake Divine and  she will assume care.    Hilario Quarry, MD 11/21/11 [phone]

## 2011-11-21 NOTE — ED Notes (Signed)
Patient c/o feeling weak, dizzy, and having blurred vision. Patient does have a history of Atrial Fibrillation and CHF.Patient denies CP, N/V, or headaches.

## 2011-11-21 NOTE — ED Notes (Signed)
Pt had what appeared to be two seven beat runs of v-tach. Nurse was alerted and was given printouts of the event,

## 2011-11-21 NOTE — Progress Notes (Signed)
Feiba administered in IVF running at 50ml over 10 minutes as ordered.

## 2011-11-22 ENCOUNTER — Encounter (HOSPITAL_COMMUNITY): Payer: Self-pay | Admitting: Gastroenterology

## 2011-11-22 LAB — CBC
Hemoglobin: 8.5 g/dL — ABNORMAL LOW (ref 13.0–17.0)
RBC: 3.02 MIL/uL — ABNORMAL LOW (ref 4.22–5.81)
WBC: 5.5 10*3/uL (ref 4.0–10.5)

## 2011-11-22 LAB — PROTIME-INR
INR: 1.33 (ref 0.00–1.49)
Prothrombin Time: 16.2 seconds — ABNORMAL HIGH (ref 11.6–15.2)

## 2011-11-22 LAB — BASIC METABOLIC PANEL
GFR calc Af Amer: 44 mL/min — ABNORMAL LOW (ref >90)
GFR calc non Af Amer: 38 mL/min — ABNORMAL LOW (ref >90)
Glucose, Bld: 100 mg/dL — ABNORMAL HIGH (ref 70–99)
Potassium: 3.9 mEq/L (ref 3.5–5.1)
Sodium: 142 mEq/L (ref 135–145)

## 2011-11-22 LAB — HEMOGLOBIN AND HEMATOCRIT, BLOOD
HCT: 26.5 % — ABNORMAL LOW (ref 39.0–52.0)
Hemoglobin: 8.4 g/dL — ABNORMAL LOW (ref 13.0–17.0)

## 2011-11-22 MED ORDER — PANTOPRAZOLE SODIUM 40 MG PO TBEC
40.0000 mg | DELAYED_RELEASE_TABLET | Freq: Two times a day (BID) | ORAL | Status: DC
  Administered 2011-11-23 – 2011-11-24 (×3): 40 mg via ORAL
  Filled 2011-11-22 (×4): qty 1

## 2011-11-22 MED ORDER — PANTOPRAZOLE SODIUM 40 MG PO TBEC
40.0000 mg | DELAYED_RELEASE_TABLET | Freq: Two times a day (BID) | ORAL | Status: DC
  Administered 2011-11-22: 40 mg via ORAL
  Filled 2011-11-22 (×2): qty 1

## 2011-11-22 NOTE — Progress Notes (Signed)
Once patient arrived to the unit, I explained what kind of diet he was on and assisted patient and his wife on how to order his meal.  Patient and wife both understood.  Approximately 30 minutes later, I went in to patient's room to give him medication and the patient was eating a [employer] sandwich given to him by his wife.  I explained to the patient that he could not have this because he was on a clear liquid diet and wife stated "but its just vegetables."  I educated wife and patient again on what was on the clear liquid diet and patient stated he understood.  Will continue to monitor.

## 2011-11-22 NOTE — Consult Note (Signed)
Reason for Consult: Guaiac positivity in a patient on Coumadin Referring Physician: Hospital team  Jesse Lambert is an 78 y.o. male.  HPI: Patient well known to our service from multiple recurrent admission and multiple procedures who was admitted with black stools guaiac positive but he is on Coumadin and iron and he did not have any obvious bleeding and his multiple procedures were reviewed and other than some periodic constipation he has no other GI complaints but did go to the [place] and had what sounds like a barium swallow but it does not sound like a small bowel follow through since he's been here he said no signs of further bleeding and has no other complaint Past Medical History  Diagnosis Date  . Diabetes mellitus   . Atrial fibrillation     Permanent, Coumadin therapy  . HTN (hypertension)   . Chronic diastolic heart failure   . Anemia   . DM2 (diabetes mellitus, type 2)   . Dementia   . Non Hodgkin's lymphoma     B cell, high-grade  . Cancer     Past Surgical History  Procedure Date  . No past surgeries   . Esophagogastroduodenoscopy 08/18/2011    Procedure: ESOPHAGOGASTRODUODENOSCOPY (EGD);  Surgeon: Shirley Friar, MD;  Location: [facility];  Service: Endoscopy;  Laterality: N/A;  . Colonoscopy 08/20/2011    Procedure: COLONOSCOPY;  Surgeon: Graylin Shiver, MD;  Location: [facility];  Service: Endoscopy;  Laterality: N/A;  . Coronary stent placement     Family History  Problem Relation Age of Onset  . Heart attack Brother     and sister  . Diabetes Other     famiyl hx    Social History:  reports that he quit smoking about 41 years ago. His smoking use included Cigarettes. He has never used smokeless tobacco. He reports that he does not drink alcohol or use illicit drugs.  Allergies: No Known Allergies  Medications: I have reviewed the patient's current medications.  Results for orders placed during the hospital encounter of 11/21/11 (from the past 48  hour(s))  OCCULT BLOOD, POC DEVICE     Status: Normal   Collection Time   11/21/11 12:08 PM      Component Value Range Comment   Fecal Occult Bld POSITIVE     CBC WITH DIFFERENTIAL     Status: Abnormal   Collection Time   11/21/11 12:25 PM      Component Value Range Comment   WBC 8.7  4.0 - 10.5 K/uL    RBC 3.90 (*) 4.22 - 5.81 MIL/uL    Hemoglobin 10.8 (*) 13.0 - 17.0 g/dL    HCT 16.1 (*) 09.6 - 52.0 %    MCV 84.4  78.0 - 100.0 fL    MCH 27.7  26.0 - 34.0 pg    MCHC 32.8  30.0 - 36.0 g/dL    RDW 04.5 (*) 40.9 - 15.5 %    Platelets 190  150 - 400 K/uL    Neutrophils Relative 70  43 - 77 %    Neutro Abs 6.1  1.7 - 7.7 K/uL    Lymphocytes Relative 17  12 - 46 %    Lymphs Abs 1.5  0.7 - 4.0 K/uL    Monocytes Relative 9  3 - 12 %    Monocytes Absolute 0.8  0.1 - 1.0 K/uL    Eosinophils Relative 3  0 - 5 %    Eosinophils Absolute 0.3  0.0 -  0.7 K/uL    Basophils Relative 0  0 - 1 %    Basophils Absolute 0.0  0.0 - 0.1 K/uL   COMPREHENSIVE METABOLIC PANEL     Status: Abnormal   Collection Time   11/21/11 12:25 PM      Component Value Range Comment   Sodium 138  135 - 145 mEq/L    Potassium 4.1  3.5 - 5.1 mEq/L    Chloride 101  96 - 112 mEq/L    CO2 26  19 - 32 mEq/L    Glucose, Bld 163 (*) 70 - 99 mg/dL    BUN 52 (*) 6 - 23 mg/dL    Creatinine, Ser 4.09 (*) 0.50 - 1.35 mg/dL    Calcium 9.1  8.4 - 81.1 mg/dL    Total Protein 7.3  6.0 - 8.3 g/dL    Albumin 3.6  3.5 - 5.2 g/dL    AST 21  0 - 37 U/L    ALT 12  0 - 53 U/L    Alkaline Phosphatase 69  39 - 117 U/L    Total Bilirubin 0.3  0.3 - 1.2 mg/dL    GFR calc non Af Amer 27 (*) >90 mL/min    GFR calc Af Amer 31 (*) >90 mL/min   PROTIME-INR     Status: Abnormal   Collection Time   11/21/11 12:25 PM      Component Value Range Comment   Prothrombin Time 25.1 (*) 11.6 - 15.2 seconds    INR 2.41 (*) 0.00 - 1.49   TYPE AND SCREEN     Status: Normal (Preliminary result)   Collection Time   11/21/11 12:25 PM      Component  Value Range Comment   ABO/RH(D) O NEG      Antibody Screen NEG      Sample Expiration 11/24/2011      Unit Number [id number]      Blood Component Type RED CELLS,LR      Unit division 00      Status of Unit ALLOCATED      Transfusion Status OK TO TRANSFUSE      Crossmatch Result Compatible      Unit Number [id number]      Blood Component Type RED CELLS,LR      Unit division 00      Status of Unit ALLOCATED      Transfusion Status OK TO TRANSFUSE      Crossmatch Result Compatible     PREPARE FRESH FROZEN PLASMA     Status: Normal (Preliminary result)   Collection Time   11/21/11  2:00 PM      Component Value Range Comment   Unit Number [id number]      Blood Component Type THAWED PLASMA      Unit division 00      Status of Unit ISSUED,FINAL      Transfusion Status OK TO TRANSFUSE      Unit Number [id number]      Blood Component Type THAWED PLASMA      Unit division 00      Status of Unit ISSUED      Transfusion Status OK TO TRANSFUSE     PREPARE RBC (CROSSMATCH)     Status: Normal   Collection Time   11/21/11  2:00 PM      Component Value Range Comment   Order Confirmation ORDER PROCESSED BY BLOOD BANK     PROTIME-INR     Status: Abnormal  Collection Time   11/21/11  3:50 PM      Component Value Range Comment   Prothrombin Time 17.6 (*) 11.6 - 15.2 seconds    INR 1.49  0.00 - 1.49   MRSA PCR SCREENING     Status: Normal   Collection Time   11/21/11  5:17 PM      Component Value Range Comment   MRSA by PCR NEGATIVE  NEGATIVE   HEMOGLOBIN AND HEMATOCRIT, BLOOD     Status: Abnormal   Collection Time   11/21/11  6:24 PM      Component Value Range Comment   Hemoglobin 9.9 (*) 13.0 - 17.0 g/dL    HCT 16.1 (*) 09.6 - 52.0 %   HEMOGLOBIN AND HEMATOCRIT, BLOOD     Status: Abnormal   Collection Time   11/21/11 10:12 PM      Component Value Range Comment   Hemoglobin 9.4 (*) 13.0 - 17.0 g/dL    HCT 04.5 (*) 40.9 - 52.0 %   PROTIME-INR     Status: Abnormal    Collection Time   11/21/11 10:17 PM      Component Value Range Comment   Prothrombin Time 18.0 (*) 11.6 - 15.2 seconds    INR 1.54 (*) 0.00 - 1.49   PROTIME-INR     Status: Abnormal   Collection Time   11/22/11  6:40 AM      Component Value Range Comment   Prothrombin Time 16.2 (*) 11.6 - 15.2 seconds    INR 1.33  0.00 - 1.49   BASIC METABOLIC PANEL     Status: Abnormal   Collection Time   11/22/11  6:40 AM      Component Value Range Comment   Sodium 142  135 - 145 mEq/L    Potassium 3.9  3.5 - 5.1 mEq/L    Chloride 107  96 - 112 mEq/L    CO2 29  19 - 32 mEq/L    Glucose, Bld 100 (*) 70 - 99 mg/dL    BUN 39 (*) 6 - 23 mg/dL    Creatinine, Ser 8.11 (*) 0.50 - 1.35 mg/dL    Calcium 8.6  8.4 - 91.4 mg/dL    GFR calc non Af Amer 38 (*) >90 mL/min    GFR calc Af Amer 44 (*) >90 mL/min   CBC     Status: Abnormal   Collection Time   11/22/11  6:40 AM      Component Value Range Comment   WBC 5.5  4.0 - 10.5 K/uL    RBC 3.02 (*) 4.22 - 5.81 MIL/uL    Hemoglobin 8.5 (*) 13.0 - 17.0 g/dL    HCT 78.2 (*) 95.6 - 52.0 %    MCV 85.4  78.0 - 100.0 fL    MCH 28.1  26.0 - 34.0 pg    MCHC 32.9  30.0 - 36.0 g/dL    RDW 21.3 (*) 08.6 - 15.5 %    Platelets 119 (*) 150 - 400 K/uL     No results found.  ROS negative except above and he has not been on any extra aspirin or nonsteroidals Blood pressure 132/75, pulse 93, temperature 98.3 F (36.8 C), temperature source Oral, resp. rate 24, height 5\' 9"  (1.753 m), weight 93.2 kg (205 lb 7.5 oz), SpO2 100.00%. Physical Exam vital signs stable afebrile no acute distress abdomen is soft nontender labs were reviewed  Assessment/Plan: Multiple medical problems including recurrent GI bleeding currently stable Plan: We  have to decide whether he will need Coumadin or not and I think if no signs of further bleeding he could go home with close outpatient followup to see if further GI workup like repeat CT or capsule endoscopy is needed and will follow  with you while here  [facility] E 11/22/2011, 12:33 PM

## 2011-11-22 NOTE — Progress Notes (Signed)
CARE MANAGEMENT NOTE 11/22/2011  Patient:  DORNELL, JACQUE   Account Number:  [id number]  Date Initiated:  11/22/2011  Documentation initiated by:  Tabari Volkert  Subjective/Objective Assessment:   pt with hx of gi bld on coumadin for a.fib presents with bloody stools and hypotension.     Action/Plan:   from home   Anticipated DC Date:  11/25/2011   Anticipated DC Plan:  HOME/SELF CARE  In-house referral  NA      DC Planning Services  NA      [facility] Choice  NA   Choice offered to / List presented to:  NA   DME arranged  NA      DME agency  NA     HH arranged  NA      HH agency  NA   Status of service:  In process, will continue to follow Medicare Important Message given?  YES (If response is "NO", the following Medicare IM given date fields will be blank) Date Medicare IM given:  11/21/2011 Date Additional Medicare IM given:    Discharge Disposition:    Per UR Regulation:  Reviewed for med. necessity/level of care/duration of stay  If discussed at Long Length of Stay Meetings, dates discussed:    Comments:  [id number] Earlene Plater, RN, BSN, CCM: CHART REVIEWED AND UPDATED.  Next chart review due on [id number]. NO DISCHARGE NEEDS PRESENT AT THIS TIME. CASE MANAGEMENT [phone]

## 2011-11-22 NOTE — Progress Notes (Signed)
[facility] PROGRESS NOTE  Jesse Lambert:[id number] DOB: 07-14-1932 DOA: 11/21/2011 PCP: Tonye Pearson, MD  Assessment/Plan: 1. GI bleed: H&H stable  admitted the patient to step down and started him on clear liquid diet  Q8hr H&H showed ddrop in H&H to 8.4 and has been stable so far. He also got 2units of PRBC transfusion.  - probably a diverticular bleed from old records vs microscopic bleed from the colon with coumadin on board.  - his coumadin has been held and he underwent reversal protocol.  - his INR was 2.4 on admission and on reversal it is 1.4  - GI consult called  Recommended no further work up at this time.   2. Hypotension: probably from dehydration  - resolved with fluds  3. Atrial fibrillation: rate controlled on metoprolol, cardizem. Anticoagulation held for gi bleed. Will need to follow up with cardiology to see if he can hold coumadin.  4. Acute on chronic renal failure: probably from dehydration and gi bleed. Improved.  Gentle hydration and repeat labs in am.    Code Status: full code Family Communication: none at bedside Disposition Plan: possibly in am    Consultants:  GI consult from Dr Ewing Schlein.   HPI/Subjective: Comfortable, denies any new complaints.   Objective: Filed Vitals:   11/22/11 0400 11/22/11 0415 11/22/11 0459 11/22/11 0500  BP: 103/55 111/71 118/78   Pulse: 69 79 82   Temp:  97.7 F (36.5 C) 98 F (36.7 C)   TempSrc:  Oral Oral   Resp: 12 16 11    Height:      Weight:    93.2 kg (205 lb 7.5 oz)  SpO2: 98%       Intake/Output Summary (Last 24 hours) at 11/22/11 0847 Last data filed at 11/22/11 0700  Gross per 24 hour  Intake 1999.84 ml  Output    751 ml  Net 1248.84 ml   Filed Weights   11/21/11 2000 11/22/11 0500  Weight: 91 kg (200 lb 9.9 oz) 93.2 kg (205 lb 7.5 oz)    Exam:   General:  ALERT AFEBRILE comfortable  Cardiovascular: s1s2 Irregular.  Respiratory: CTAB  Abdomen: soft NT ND  BS+  Extremities: trace pedal edema.   Data Reviewed: Basic Metabolic Panel:  Lab 11/22/11 8657 11/21/11 1225  NA 142 138  K 3.9 4.1  CL 107 101  CO2 29 26  GLUCOSE 100* 163*  BUN 39* 52*  CREATININE 1.67* 2.22*  CALCIUM 8.6 9.1  MG -- --  PHOS -- --   Liver Function Tests:  Lab 11/21/11 1225  AST 21  ALT 12  ALKPHOS 69  BILITOT 0.3  PROT 7.3  ALBUMIN 3.6   No results found for this basename: LIPASE:5,AMYLASE:5 in the last 168 hours No results found for this basename: AMMONIA:5 in the last 168 hours CBC:  Lab 11/22/11 0640 11/21/11 2212 11/21/11 1824 11/21/11 1225  WBC 5.5 -- -- 8.7  NEUTROABS -- -- -- 6.1  HGB 8.5* 9.4* 9.9* 10.8*  HCT 25.8* 29.5* 30.2* 32.9*  MCV 85.4 -- -- 84.4  PLT 119* -- -- 190   Cardiac Enzymes: No results found for this basename: CKTOTAL:5,CKMB:5,CKMBINDEX:5,TROPONINI:5 in the last 168 hours BNP (last 3 results)  Basename 08/18/11 1010  PROBNP 2199.0*   CBG: No results found for this basename: GLUCAP:5 in the last 168 hours  Recent Results (from the past 240 hour(s))  MRSA PCR SCREENING     Status: Normal   Collection Time  11/21/11  5:17 PM      Component Value Range Status Comment   MRSA by PCR NEGATIVE  NEGATIVE Final      Studies: No results found.  Scheduled Meds:   . [COMPLETED] sodium chloride  1,000 mL Intravenous Once  . [COMPLETED] anti-inhibitor coagulant complex  539 Units Intravenous STAT  . atorvastatin  10 mg Oral q1800  . metoprolol succinate  100 mg Oral Daily  . [COMPLETED] pantoprazole (PROTONIX) IV  80 mg Intravenous Once  . [COMPLETED] phytonadione (VITAMIN K) IV  10 mg Intravenous STAT  . sodium chloride  3 mL Intravenous Q12H  . valACYclovir  500 mg Oral BID  . [DISCONTINUED] phytonadione (VITAMIN K) IV  10 mg Intravenous Once   Continuous Infusions:   . sodium chloride 1,000 mL (11/22/11 0123)  . sodium chloride 500 mL (11/21/11 1428)  . pantoprozole (PROTONIX) infusion 8 mg/hr (11/21/11  1950)           [facility]  [facility] Pager [phone]. If 8PM-8AM, please contact night-coverage at [email or web address], password [facility] 11/22/2011, 8:47 AM  LOS: 1 day

## 2011-11-23 LAB — PREPARE FRESH FROZEN PLASMA: Unit division: 0

## 2011-11-23 LAB — BASIC METABOLIC PANEL
BUN: 19 mg/dL (ref 6–23)
Calcium: 8.6 mg/dL (ref 8.4–10.5)
GFR calc Af Amer: 61 mL/min — ABNORMAL LOW (ref >90)
GFR calc non Af Amer: 53 mL/min — ABNORMAL LOW (ref >90)
Potassium: 4 mEq/L (ref 3.5–5.1)
Sodium: 138 mEq/L (ref 135–145)

## 2011-11-23 LAB — CBC
MCH: 27.6 pg (ref 26.0–34.0)
MCHC: 31.9 g/dL (ref 30.0–36.0)
RDW: 18.3 % — ABNORMAL HIGH (ref 11.5–15.5)

## 2011-11-23 MED ORDER — TRAMADOL HCL 50 MG PO TABS
50.0000 mg | ORAL_TABLET | Freq: Four times a day (QID) | ORAL | Status: DC | PRN
  Administered 2011-11-23: 50 mg via ORAL
  Filled 2011-11-23: qty 1

## 2011-11-23 NOTE — Progress Notes (Signed)
[facility] PROGRESS NOTE  Jesse Lambert JXB:[id number] DOB: Jun 19, 1932 DOA: 11/21/2011 PCP: Tonye Pearson, MD  Assessment/Plan: 1-GI bleeding: patient with black stool overnight, but no changes on his Hgb level. Most likely old blood vs change in color due to ferrous sulfate. Will continue PPI. Will discuss with cardiology for need of coumadin but after discussing with patient he will not like to have stroke and has not experienced life threatening bleeding. GI is on board and will follow recommendations. For now diet advanced and tolerated; will follow CBC in am and if continue to be stable most likely home tomorrow.  2-Hypotension: due to antihypertensive drugs, dehydration and potential bleeding. -Now resolved -Orthostatic VS in am  3-A, fib: rate controlled -Continue diltiazem and also cardizem -Holding coumadin and INR reverted due to GI bleeding.  4-Acute on chronic renal failure: due to decreased perfusion and dehydration. Pretty much back to baseline after IVF's.  5-DVT: SCD's  Code Status: Full Family Communication: wife at bedside Disposition Plan: Home when medically stable  Consultants:  GI  Antibiotics:  none  HPI/Subjective: Afebrile, no abdominal pain, no CP or SOB. Patient reports one episode of black stool overnight. Patient complaining of back discomfort (chronic).  Objective: Filed Vitals:   11/22/11 2140 11/23/11 0527 11/23/11 0945 11/23/11 1431  BP: 116/84 131/75 136/83 114/77  Pulse: 83 94 100 92  Temp: 98.5 F (36.9 C) 98.3 F (36.8 C)  98.6 F (37 C)  TempSrc: Oral Oral  Oral  Resp: 23 20  20   Height:      Weight:  90.9 kg (200 lb 6.4 oz)    SpO2: 99% 98%  100%    Intake/Output Summary (Last 24 hours) at 11/23/11 1450 Last data filed at 11/23/11 1432  Gross per 24 hour  Intake 2353.83 ml  Output   1215 ml  Net 1138.83 ml   Filed Weights   11/22/11 0500 11/22/11 1651 11/23/11 0527  Weight: 93.2 kg (205 lb 7.5 oz)  94.6 kg (208 lb 8.9 oz) 90.9 kg (200 lb 6.4 oz)    Exam:   General:  NAD, afebrile, no abdominal pain   Cardiovascular: regular rate, irregular, positive SEM, no rubs  Respiratory: CTA  Abdomen: soft, NT, ND, positive BS  Extremities: no edema, no cyanosis, no clubbing  Neuro: no new focal deficit  Data Reviewed: Basic Metabolic Panel:  Lab 11/23/11 1308 11/22/11 0640 11/21/11 1225  NA 138 142 138  K 4.0 3.9 4.1  CL 106 107 101  CO2 26 29 26   GLUCOSE 125* 100* 163*  BUN 19 39* 52*  CREATININE 1.27 1.67* 2.22*  CALCIUM 8.6 8.6 9.1  MG -- -- --  PHOS -- -- --   Liver Function Tests:  Lab 11/21/11 1225  AST 21  ALT 12  ALKPHOS 69  BILITOT 0.3  PROT 7.3  ALBUMIN 3.6   CBC:  Lab 11/23/11 0515 11/22/11 1508 11/22/11 0640 11/21/11 2212 11/21/11 1824 11/21/11 1225  WBC 5.6 -- 5.5 -- -- 8.7  NEUTROABS -- -- -- -- -- 6.1  HGB 8.4* 8.4* 8.5* 9.4* 9.9* --  HCT 26.3* 26.5* 25.8* 29.5* 30.2* --  MCV 86.5 -- 85.4 -- -- 84.4  PLT 121* -- 119* -- -- 190   BNP (last 3 results)  Basename 08/18/11 1010  PROBNP 2199.0*   CBG:  Lab 11/22/11 0824  GLUCAP 108*    Recent Results (from the past 240 hour(s))  MRSA PCR SCREENING     Status: Normal  Collection Time   11/21/11  5:17 PM      Component Value Range Status Comment   MRSA by PCR NEGATIVE  NEGATIVE Final      Studies: No results found.  Scheduled Meds:   . atorvastatin  10 mg Oral q1800  . metoprolol succinate  100 mg Oral Daily  . pantoprazole  40 mg Oral BID  . sodium chloride  3 mL Intravenous Q12H  . valACYclovir  500 mg Oral BID  . [DISCONTINUED] pantoprazole  40 mg Oral BID AC   Continuous Infusions:   . sodium chloride 20 mL (11/23/11 0139)  . [DISCONTINUED] sodium chloride 1,000 mL (11/23/11 0941)    Time spent: >30 minutes    Jenavieve Freda  [facility] Pager [phone] If 8PM-8AM, please contact night-coverage at [email or web address], password [facility] 11/23/2011, 2:50 PM  LOS: 2 days

## 2011-11-23 NOTE — Progress Notes (Signed)
[name] 12:49 PM  Subjective: Patient without GI complaints and no signs of bleeding and eating regular food  Objective: Vital signs stable afebrile no acute distress hemoglobin Stable Assessment: Subacute GI bleeding in a patient on Coumadin with nondiagnostic GI workup in the past  Plan: Okay to be followed up as an outpatient and he believes he had a colonoscopy at the [place] and he sees Them next week and have asked him to bring Me that report And reevaluate whether he truly needs Coumadin or not or other blood thinners And we'll decide further workup and plans as now based on hemoglobin and guaiacs at that time  [facility] E

## 2011-11-24 DIAGNOSIS — I5032 Chronic diastolic (congestive) heart failure: Secondary | ICD-10-CM

## 2011-11-24 DIAGNOSIS — I509 Heart failure, unspecified: Secondary | ICD-10-CM

## 2011-11-24 LAB — CBC
MCH: 27.3 pg (ref 26.0–34.0)
Platelets: 148 10*3/uL — ABNORMAL LOW (ref 150–400)
RBC: 3.19 MIL/uL — ABNORMAL LOW (ref 4.22–5.81)
RDW: 18.1 % — ABNORMAL HIGH (ref 11.5–15.5)
WBC: 7.8 10*3/uL (ref 4.0–10.5)

## 2011-11-24 MED ORDER — FERROUS SULFATE 325 (65 FE) MG PO TABS: 325.0000 mg | ORAL_TABLET | Freq: Three times a day (TID) | ORAL | Status: DC

## 2011-11-24 MED ORDER — OMEPRAZOLE 20 MG PO CPDR: 40.0000 mg | DELAYED_RELEASE_CAPSULE | Freq: Two times a day (BID) | ORAL | Status: AC

## 2011-11-24 NOTE — Discharge Summary (Signed)
Physician Discharge Summary  Jesse Lambert:[id number] DOB: [birth year] DOA: 11/21/2011  PCP: Tonye Pearson, MD  Admit date: 11/21/2011 Discharge date: 11/24/2011  Time spent: >30 minutes  Recommendations for Outpatient Follow-up:  -Arrange follow up visit with PCP in 10-14 days (Follow CBC and BMET; to assess Hgb level and kidney function and electrolytes) -Arrange follow up with cardiologist in 2 weeks (determine future plan for anticoagulation) -Take medications as prescribed -Arrange follow up with Dr. Ewing Schlein (gastroenterologist) in 2-3 weeks after discharge (to decide outpatient GI workup; EGD vs capsule endoscopy vs colonoscopy vs CT. -Follow a low carbohydrates and low sodium diet  Discharge Diagnoses:  1-GI bleed 2-Acute blood loss anemia 2/2 #1 3-Atrial fibrillation 4-Acute on chronic renal failure 5-Diverticulosis 6-GERD 7-DM 8-HTN 9-chronic diastolic heart failure 10-Hx of Non-hodgkin's Lymphoma   Discharge Condition: stable and improved. No signs of active bleeding and Hgb level 8.7; patient denies CP and SOB.  Diet recommendation: low carbohydrates and low sodium diet.  Filed Weights   11/22/11 1651 11/23/11 0527 11/24/11 0540  Weight: 94.6 kg (208 lb 8.9 oz) 90.9 kg (200 lb 6.4 oz) 90.2 kg (198 lb 13.7 oz)    History of present illness:  78 y.o. male with multiple medical problems including , HTN, DM, atrial firbillation on coumadin, with recent hopsitalization for gi bleed comes in for lightheadedness, and dizziness and was found to be hypotensive and anemic when compared to the last visit. Patient also reports black stools and he was found to be positive for occult blood in stool. He denies any complaints other than the dizziness which has resolved. He is admitted to hospitalist service to step down for GI bleed from possible coumadin.   Hospital Course:  1-GI bleeding: recurrent and most likely associated with use of anticoagulation. After  reverting his INR and transfusing 2 units or PRBC's patient's symptoms completely resolved and he has not experienced any further episode of bleeding. Hgb remains stable and at 8.7 range at discharge. After discussing with cardiology plan is to hold on coumadin for the next 2 weeks and patient will follow at the office for further decision making on this subject. Per GI stand point no procedures needed inside the hospital and the plan is for outpatient workup after discharge. Diet well tolerated and no signs of bleeding or abdominal pain.  2-Hypotensive episode: secondary to continue use of antihypertensive drugs, dehydration and potential bleeding. Resolved after fluid resuscitation and blood transfusion. No orthostatic changes appreciated. Antihypertensive drugs restarted at discharge.  3-A, fib: rate controlled  -Continue diltiazem and also metoprolol -Patient will follow with cardiology to determine final plans on chronic anticoagulation.  -At this point no coumadin will be prescribed.  4-Acute on chronic renal failure: due to decreased perfusion and dehydration. Resolved and with Cr back to baseline at discharge.  5-DM: continue glipizide and low carb diet  6-GERD: continue PPI BID  7-Chronic diastolic heart failure: compensated and no signs of fluid overload appreciated during hospitalization. Patient will continue low sodium diet and will follow in 2 weeks with cardiologist.  * rest of medical problems remains stable and the plan is to continue current medication regimen and to follow with PCP in 10-14 days for further evaluation and treatment of chronic conditions.   Consultations:  GI  Cardiology curbside (Dr. Johney Frame)  Discharge Exam: Filed Vitals:   11/23/11 1431 11/23/11 2001 11/24/11 0540 11/24/11 0951  BP: 114/77 138/86  127/85  Pulse: 92 95 100 94  Temp: 98.6 F (  37 C) 99 F (37.2 C) 98.7 F (37.1 C)   TempSrc: Oral Oral Oral   Resp: 20 19 19    Height:      Weight:    90.2 kg (198 lb 13.7 oz)   SpO2: 100% 100% 100%    General: NAD, afebrile, no abdominal pain  Cardiovascular: rate controlled, irregular rhythm, positive SEM, no rubs and no gallops Respiratory: CTA  Abdomen: soft, NT, ND, positive BS  Extremities: no edema, no cyanosis, no clubbing  Neuro: no focal deficit  Discharge Instructions  Discharge Orders    Future Appointments: Provider: Department: Dept Phone: Center:   11/30/2011 11:30 AM Victorino December, MD [facility] MEDICAL ONCOLOGY [phone] None   12/12/2011 9:45 AM [facility] [phone] None   12/12/2011 10:00 AM Rosalio Macadamia, NP [facility] [place]) [phone] [facility]   03/30/2012 11:00 AM [facility] [phone] [place]   04/09/2012 11:00 AM [facility] MEDICAL ONCOLOGY [phone] None   04/09/2012 11:30 AM Victorino December, MD [facility] [phone] None     Future Orders Please Complete By Expires   Discharge instructions      Comments:   -Arrange follow up visit with PCP in 10-14 days -Arrange follow up with cardiologist in 2 weeks -Take medications as prescribed -Arrange follow up with Dr. Ewing Schlein (gastroenterologist) in 2-3 weeks after discharge -Follow a low carbohydrates and low sodium diet       Medication List     As of 11/24/2011 11:48 AM    STOP taking these medications         pantoprazole 40 MG tablet   Commonly known as: PROTONIX      warfarin 5 MG tablet   Commonly known as: COUMADIN      TAKE these medications         atorvastatin 20 MG tablet   Commonly known as: LIPITOR   Take 10 mg by mouth daily.      bumetanide 1 MG tablet   Commonly known as: BUMEX   Take 2 mg by mouth daily. Prescribed from [place] hospitalization      diltiazem 120 MG 24 hr capsule   Commonly known as: CARDIZEM CD   Take 120 mg  by mouth daily.      ferrous sulfate 325 (65 FE) MG tablet   Take 1 tablet (325 mg total) by mouth 3 (three) times daily with meals.      glipiZIDE 5 MG tablet   Commonly known as: GLUCOTROL   Take 5 mg by mouth 2 (two) times daily before a meal.      lisinopril 5 MG tablet   Commonly known as: PRINIVIL,ZESTRIL   Take 2.5 mg by mouth daily.      metoprolol succinate 100 MG 24 hr tablet   Commonly known as: TOPROL-XL   Take 100 mg by mouth daily.      multivitamin with minerals Tabs   Take 1 tablet by mouth daily.      omeprazole 20 MG capsule   Commonly known as: PRILOSEC   Take 2 capsules (40 mg total) by mouth 2 (two) times daily before a meal.      valACYclovir 500 MG tablet   Commonly known as: VALTREX   Take 1 tablet (500 mg total) by mouth 2 (two) times daily.      VITAMIN B-12  CR PO   Take 1 tablet by mouth daily.           Follow-up Information    Follow up with [facility] E, MD. Schedule an appointment as soon as possible for a visit in 2 weeks.   Contact information:   [address]                         [place] [address] [phone]       Follow up with DOOLITTLE, Harrel Lemon, MD. Schedule an appointment as soon as possible for a visit in 14 days.   Contact information:   [address] [place] [address] [phone]       Follow up with Hillis Range, MD. Schedule an appointment as soon as possible for a visit in 2 weeks.   Contact information:   [address], [address] [place] [address] [phone]           The results of significant diagnostics from this hospitalization (including imaging, microbiology, ancillary and laboratory) are listed below for reference.    Microbiology: Recent Results (from the past 240 hour(s))  MRSA PCR SCREENING     Status: Normal   Collection Time   11/21/11  5:17 PM      Component Value Range Status Comment   MRSA by PCR NEGATIVE  NEGATIVE Final       Labs: Basic Metabolic Panel:  Lab 11/23/11 4034 11/22/11 0640 11/21/11 1225  NA 138 142 138  K 4.0 3.9 4.1  CL 106 107 101  CO2 26 29 26   GLUCOSE 125* 100* 163*  BUN 19 39* 52*  CREATININE 1.27 1.67* 2.22*  CALCIUM 8.6 8.6 9.1  MG -- -- --  PHOS -- -- --   Liver Function Tests:  Lab 11/21/11 1225  AST 21  ALT 12  ALKPHOS 69  BILITOT 0.3  PROT 7.3  ALBUMIN 3.6   CBC:  Lab 11/24/11 0515 11/23/11 0515 11/22/11 1508 11/22/11 0640 11/21/11 2212 11/21/11 1225  WBC 7.8 5.6 -- 5.5 -- 8.7  NEUTROABS -- -- -- -- -- 6.1  HGB 8.7* 8.4* 8.4* 8.5* 9.4* --  HCT 27.5* 26.3* 26.5* 25.8* 29.5* --  MCV 86.2 86.5 -- 85.4 -- 84.4  PLT 148* 121* -- 119* -- 190   BNP: BNP (last 3 results)  Basename 08/18/11 1010  PROBNP 2199.0*   CBG:  Lab 11/22/11 0824  GLUCAP 108*     Signed:  Tarrie Mcmichen  [facility] 11/24/2011, 11:48 AM

## 2011-11-24 NOTE — Care Management Note (Signed)
    Page 1 of 2   11/24/2011     2:54:43 PM   CARE MANAGEMENT NOTE 11/24/2011  Patient:  Jesse Lambert, Jesse Lambert   Account Number:  [id number]  Date Initiated:  11/22/2011  Documentation initiated by:  DAVIS,RHONDA  Subjective/Objective Assessment:   pt with hx of gi bld on coumadin for a.fib presents with bloody stools and hypotension.     Action/Plan:   from home   Anticipated DC Date:  11/24/2011   Anticipated DC Plan:  HOME/SELF CARE  In-house referral  NA      DC Planning Services  NA      [facility] Choice  NA   Choice offered to / List presented to:  NA   DME arranged  NA      DME agency  NA     HH arranged  NA      HH agency  NA   Status of service:  Completed, signed off Medicare Important Message given?  YES (If response is "NO", the following Medicare IM given date fields will be blank) Date Medicare IM given:  11/21/2011 Date Additional Medicare IM given:    Discharge Disposition:  HOME/SELF CARE  Per UR Regulation:  Reviewed for med. necessity/level of care/duration of stay  If discussed at Long Length of Stay Meetings, dates discussed:    Comments:  11/24/11 Kieana Livesay RN,BSN NCM [phone] D/C HOME NO NEEDS.  [id number] Earlene Plater, RN, BSN, CCM: CHART REVIEWED AND UPDATED.  Next chart review due on [id number]. NO DISCHARGE NEEDS PRESENT AT THIS TIME. CASE MANAGEMENT [phone]

## 2011-11-25 LAB — TYPE AND SCREEN: Unit division: 0

## 2011-11-30 ENCOUNTER — Encounter: Payer: Self-pay | Admitting: Oncology

## 2011-11-30 ENCOUNTER — Other Ambulatory Visit (HOSPITAL_BASED_OUTPATIENT_CLINIC_OR_DEPARTMENT_OTHER): Payer: Medicare Other | Admitting: Lab

## 2011-11-30 ENCOUNTER — Ambulatory Visit (HOSPITAL_BASED_OUTPATIENT_CLINIC_OR_DEPARTMENT_OTHER): Payer: Medicare Other | Admitting: Oncology

## 2011-11-30 ENCOUNTER — Telehealth: Payer: Self-pay | Admitting: *Deleted

## 2011-11-30 VITALS — BP 133/77 | HR 101 | Temp 98.2°F | Resp 20 | Ht 69.0 in | Wt 199.9 lb

## 2011-11-30 DIAGNOSIS — K922 Gastrointestinal hemorrhage, unspecified: Secondary | ICD-10-CM

## 2011-11-30 DIAGNOSIS — I4891 Unspecified atrial fibrillation: Secondary | ICD-10-CM

## 2011-11-30 DIAGNOSIS — C8589 Other specified types of non-Hodgkin lymphoma, extranodal and solid organ sites: Secondary | ICD-10-CM

## 2011-11-30 LAB — CBC WITH DIFFERENTIAL/PLATELET
BASO%: 0.2 % (ref 0.0–2.0)
EOS%: 4.5 % (ref 0.0–7.0)
HCT: 34 % — ABNORMAL LOW (ref 38.4–49.9)
MCHC: 32.5 g/dL (ref 32.0–36.0)
MONO#: 0.8 10*3/uL (ref 0.1–0.9)
NEUT%: 62.5 % (ref 39.0–75.0)
RDW: 19.4 % — ABNORMAL HIGH (ref 11.0–14.6)
WBC: 8 10*3/uL (ref 4.0–10.3)
lymph#: 1.8 10*3/uL (ref 0.9–3.3)

## 2011-11-30 LAB — IRON AND TIBC
Iron: 69 ug/dL (ref 42–165)
TIBC: 420 ug/dL (ref 215–435)
UIBC: 351 ug/dL (ref 125–400)

## 2011-11-30 LAB — FERRITIN: Ferritin: 26 ng/mL (ref 22–322)

## 2011-11-30 NOTE — Patient Instructions (Addendum)
Blood test today  We will call you with the results of your blood test

## 2011-11-30 NOTE — Telephone Encounter (Signed)
Per orders patient needs to go back to the labs on 11-30-2011

## 2011-11-30 NOTE — Progress Notes (Signed)
OFFICE PROGRESS NOTE  CC  DOOLITTLE, Harrel Lemon, MD [address] [place] [address] Dr. Hillis Range Thomasenia Bottoms PA  DIAGNOSIS: 78 year old male high grade non Hodgkin Lymphoma diagnosed in June 2010  PRIOR THERAPY:  1. Radiation to the thoracic spine and adjacent areas of involvement from 06/17/08 - 07/15/08  2. CHOP_R x 6 cycles 08/07/08 - 11/12/08  CURRENT THERAPY:observation  INTERVAL HISTORY: Jesse Lambert 78 y.o. male returns for follow up visit. Overall he is doing well. He is receiving coumadin because of an irregular heartbeat. He has not any fevers or chills or night sweats, no pain in the back , no difficulty walking. Bottom of his feet is still numb, it comes and goes. He has had a little bit of loss of hearing.No bleeding or bruising.Remainder of the 10 point  review of systems is negative.  MEDICAL HISTORY: Past Medical History  Diagnosis Date  . Diabetes mellitus   . Atrial fibrillation     Permanent, Coumadin therapy  . HTN (hypertension)   . Chronic diastolic heart failure   . Anemia   . DM2 (diabetes mellitus, type 2)   . Dementia   . Non Hodgkin's lymphoma     B cell, high-grade  . Cancer     ALLERGIES:   has no known allergies.  MEDICATIONS:  Current Outpatient Prescriptions  Medication Sig Dispense Refill  . atorvastatin (LIPITOR) 20 MG tablet Take 10 mg by mouth daily.      . Cyanocobalamin (VITAMIN B-12 CR PO) Take 1 tablet by mouth daily.       Marland Kitchen diltiazem (CARDIZEM CD) 120 MG 24 hr capsule Take 120 mg by mouth daily.      . ferrous sulfate 325 (65 FE) MG tablet Take 1 tablet (325 mg total) by mouth 3 (three) times daily with meals.  90 tablet  1  . glipiZIDE (GLUCOTROL) 5 MG tablet Take 5 mg by mouth 2 (two) times daily before a meal.      . metoprolol succinate (TOPROL-XL) 100 MG 24 hr tablet Take 100 mg by mouth daily.       . Multiple Vitamin (MULTIVITAMIN WITH MINERALS) TABS Take 1 tablet by mouth daily.      Marland Kitchen omeprazole  (PRILOSEC) 20 MG capsule Take 2 capsules (40 mg total) by mouth 2 (two) times daily before a meal.  60 capsule  1    SURGICAL HISTORY:  Past Surgical History  Procedure Date  . No past surgeries   . Esophagogastroduodenoscopy 08/18/2011    Procedure: ESOPHAGOGASTRODUODENOSCOPY (EGD);  Surgeon: Shirley Friar, MD;  Location: [facility];  Service: Endoscopy;  Laterality: N/A;  . Colonoscopy 08/20/2011    Procedure: COLONOSCOPY;  Surgeon: Graylin Shiver, MD;  Location: [facility];  Service: Endoscopy;  Laterality: N/A;  . Coronary stent placement     REVIEW OF SYSTEMS:  Pertinent items are noted in HPI.   PHYSICAL EXAMINATION: Head: Normocephalic, without obvious abnormality, atraumatic Neck: no adenopathy, no carotid bruit, no JVD, supple, symmetrical, trachea midline and thyroid not enlarged, symmetric, no tenderness/mass/nodules Lymph nodes: Cervical, supraclavicular, and axillary nodes normal. Resp: clear to auscultation bilaterally and normal percussion bilaterally Back: symmetric, no curvature. ROM normal. No CVA tenderness. Cardio: irregularly irregular rhythm GI: soft, non-tender; bowel sounds normal; no masses,  no organomegaly Extremities: extremities normal, atraumatic, no cyanosis or edema Neurologic: Alert and oriented X 3, normal strength and tone. Normal symmetric reflexes. Normal coordination and gait  ECOG PERFORMANCE STATUS: 1 -  Symptomatic but completely ambulatory  Blood pressure 133/77, pulse 101, temperature 98.2 F (36.8 C), temperature source Oral, resp. rate 20, height 5\' 9"  (1.753 m), weight 199 lb 14.4 oz (90.674 kg).  LABORATORY DATA: Lab Results  Component Value Date   WBC 7.8 11/24/2011   HGB 8.7* 11/24/2011   HCT 27.5* 11/24/2011   MCV 86.2 11/24/2011   PLT 148* 11/24/2011      Chemistry      Component Value Date/Time   NA 138 11/23/2011 0515   NA 136 11/03/2011 1209   NA 147* 01/24/2011 0830   K 4.0 11/23/2011 0515   K 3.7 11/03/2011  1209   K 4.2 01/24/2011 0830   CL 106 11/23/2011 0515   CL 100 11/03/2011 1209   CL 105 01/24/2011 0830   CO2 26 11/23/2011 0515   CO2 29 11/03/2011 1209   CO2 30 01/24/2011 0830   BUN 19 11/23/2011 0515   BUN 19.0 11/03/2011 1209   BUN 19 01/24/2011 0830   CREATININE 1.27 11/23/2011 0515   CREATININE 1.5* 11/03/2011 1209   CREATININE 1.4* 01/24/2011 0830      Component Value Date/Time   CALCIUM 8.6 11/23/2011 0515   CALCIUM 8.9 11/03/2011 1209   CALCIUM 9.5 01/24/2011 0830   ALKPHOS 69 11/21/2011 1225   ALKPHOS 89 11/03/2011 1209   ALKPHOS 74 01/24/2011 0830   AST 21 11/21/2011 1225   AST 28 11/03/2011 1209   AST 22 01/24/2011 0830   ALT 12 11/21/2011 1225   ALT 22 11/03/2011 1209   BILITOT 0.3 11/21/2011 1225   BILITOT 0.67 11/03/2011 1209   BILITOT 1.00 01/24/2011 0830       RADIOGRAPHIC STUDIES:   ASSESSMENT: 78 year old male with :  1. Non Hodgkin Lymphoma near the thoracic spine patient recived radiation followed by chemotherapy CHOP-R NED  #2 atrial fibrillation patient is on long-term anticoagulation his INRs are being checked at his cardiologist's office.  #3 varicella-zoster interruption the lesions have crusted over.  #4 GI bleeding patient was recently hospitalized he was seen by Dr.Magod. Patient will be apparently undergoing a colonoscopy for further workup of bleeding.    PLAN:   1. Follow up in 6 months with labs  All questions were answered. The patient knows to call the clinic with any problems, questions or concerns. We can certainly see the patient much sooner if necessary.  I spent 15 minutes counseling the patient face to face. The total time spent in the appointment was 30 minutes.    Drue Second, MD Medical/Oncology [facility] [phone] (beeper) [phone] (Office)  11/30/2011, 11:58 AM

## 2011-12-02 NOTE — Progress Notes (Signed)
Quick Note:  Please call patient: take 1 over the counter oral iron daily ______

## 2011-12-12 ENCOUNTER — Ambulatory Visit (INDEPENDENT_AMBULATORY_CARE_PROVIDER_SITE_OTHER): Payer: Medicare Other | Admitting: Nurse Practitioner

## 2011-12-12 ENCOUNTER — Ambulatory Visit (INDEPENDENT_AMBULATORY_CARE_PROVIDER_SITE_OTHER): Payer: Medicare Other | Admitting: Pharmacist

## 2011-12-12 ENCOUNTER — Encounter: Payer: Self-pay | Admitting: Nurse Practitioner

## 2011-12-12 VITALS — BP 132/88 | HR 68 | Ht 69.0 in | Wt 203.8 lb

## 2011-12-12 DIAGNOSIS — K922 Gastrointestinal hemorrhage, unspecified: Secondary | ICD-10-CM

## 2011-12-12 DIAGNOSIS — Z7901 Long term (current) use of anticoagulants: Secondary | ICD-10-CM

## 2011-12-12 DIAGNOSIS — I4891 Unspecified atrial fibrillation: Secondary | ICD-10-CM

## 2011-12-12 LAB — BASIC METABOLIC PANEL
BUN: 16 mg/dL (ref 6–23)
CO2: 30 mEq/L (ref 19–32)
Calcium: 8.9 mg/dL (ref 8.4–10.5)
Chloride: 98 mEq/L (ref 96–112)
Creatinine, Ser: 1.7 mg/dL — ABNORMAL HIGH (ref 0.4–1.5)
GFR: 52.16 mL/min — ABNORMAL LOW (ref >60.00)
Glucose, Bld: 206 mg/dL — ABNORMAL HIGH (ref 70–99)
Potassium: 4 mEq/L (ref 3.5–5.1)
Sodium: 136 mEq/L (ref 135–145)

## 2011-12-12 LAB — CBC WITH DIFFERENTIAL/PLATELET
Basophils Absolute: 0 10*3/uL (ref 0.0–0.1)
Basophils Relative: 0.3 % (ref 0.0–3.0)
Eosinophils Absolute: 0.4 10*3/uL (ref 0.0–0.7)
Eosinophils Relative: 5.6 % — ABNORMAL HIGH (ref 0.0–5.0)
HCT: 35.6 % — ABNORMAL LOW (ref 39.0–52.0)
Hemoglobin: 11.4 g/dL — ABNORMAL LOW (ref 13.0–17.0)
Lymphocytes Relative: 18.2 % (ref 12.0–46.0)
Lymphs Abs: 1.2 10*3/uL (ref 0.7–4.0)
MCHC: 32.1 g/dL (ref 30.0–36.0)
MCV: 87.3 fl (ref 78.0–100.0)
Monocytes Absolute: 0.6 10*3/uL (ref 0.1–1.0)
Monocytes Relative: 9.1 % (ref 3.0–12.0)
Neutro Abs: 4.5 10*3/uL (ref 1.4–7.7)
Neutrophils Relative %: 66.8 % (ref 43.0–77.0)
Platelets: 247 10*3/uL (ref 150.0–400.0)
RBC: 4.08 Mil/uL — ABNORMAL LOW (ref 4.22–5.81)
RDW: 18.8 % — ABNORMAL HIGH (ref 11.5–14.6)
WBC: 6.8 10*3/uL (ref 4.5–10.5)

## 2011-12-12 NOTE — Progress Notes (Signed)
Jesse Lambert Date of Birth: [birth year] Medical Record #[id number]  History of Present Illness: Jesse Lambert is seen back today for a post hospital visit. He is seen for Dr. Johney Frame. He has chronic atrial fib, renal failure, GERD, DM, HTN, dementia, diastolic heart failure with normal EF per echo back in 2011 and a history of non-hodgkin's lymphoma. Sees Dr. Welton Flakes at the cancer center.  He comes in today. He is here with his wife. He was admitted 2 weeks ago with recurrent GI bleeding requiring transfusion. Hemoglobin back down to 8. His GI work up is to be completed as an outpatient and they have a visit with Dr. Ewing Schlein for Friday of this week to determine if EDG vs capsule endoscopy vs colonoscopy vs CT needs to be arranged. He is on iron. No etiology yet has been determined. He is still a little weak. Stools remain dark but he is on iron. He presented with weakness and hypotension. Had not had NSAID use. Did require transfusion. No chest pain. Has chronic DOE that is unchanged. No swelling. Blood pressure at home is ok.   Current Outpatient Prescriptions on File Prior to Visit  Medication Sig Dispense Refill  . atorvastatin (LIPITOR) 20 MG tablet Take 10 mg by mouth daily.      . Cyanocobalamin (VITAMIN B-12 CR PO) Take 1 tablet by mouth daily.       Marland Kitchen diltiazem (CARDIZEM CD) 120 MG 24 hr capsule Take 120 mg by mouth daily.      . ferrous sulfate 325 (65 FE) MG tablet Take 1 tablet (325 mg total) by mouth 3 (three) times daily with meals.  90 tablet  1  . glipiZIDE (GLUCOTROL) 5 MG tablet Take 5 mg by mouth 2 (two) times daily before a meal.      . metoprolol succinate (TOPROL-XL) 100 MG 24 hr tablet Take 100 mg by mouth daily.       . Multiple Vitamin (MULTIVITAMIN WITH MINERALS) TABS Take 1 tablet by mouth daily.      Marland Kitchen omeprazole (PRILOSEC) 20 MG capsule Take 2 capsules (40 mg total) by mouth 2 (two) times daily before a meal.  60 capsule  1    No Known Allergies  Past Medical  History  Diagnosis Date  . Diabetes mellitus   . Atrial fibrillation     Permanent, Coumadin therapy  . HTN (hypertension)   . Chronic diastolic heart failure   . Anemia   . DM2 (diabetes mellitus, type 2)   . Dementia   . Non Hodgkin's lymphoma     B cell, high-grade  . Cancer     Past Surgical History  Procedure Date  . No past surgeries   . Esophagogastroduodenoscopy 08/18/2011    Procedure: ESOPHAGOGASTRODUODENOSCOPY (EGD);  Surgeon: Shirley Friar, MD;  Location: [facility];  Service: Endoscopy;  Laterality: N/A;  . Colonoscopy 08/20/2011    Procedure: COLONOSCOPY;  Surgeon: Graylin Shiver, MD;  Location: [facility];  Service: Endoscopy;  Laterality: N/A;  . Coronary stent placement     History  Smoking status  . Former Smoker  . Types: Cigarettes  . Quit date: 07/28/1970  Smokeless tobacco  . Never Used    History  Alcohol Use No    Family History  Problem Relation Age of Onset  . Heart attack Brother     and sister  . Diabetes Other     famiyl hx    Review of Systems: The review of systems  is per the HPI.  All other systems were reviewed and are negative.  Physical Exam: BP 132/88  Pulse 68  Ht 5\' 9"  (1.753 m)  Wt 203 lb 12.8 oz (92.443 kg)  BMI 30.10 kg/m2 Patient is very pleasant and in no acute distress. Skin is warm and dry. Color is normal.  HEENT is unremarkable. Normocephalic/atraumatic. PERRL. Sclera are nonicteric. Neck is supple. No masses. No JVD. Lungs are clear. Cardiac exam shows an irregular rhythm. Rate is controlled. Abdomen is soft. Extremities are without edema. Gait and ROM are intact. No gross neurologic deficits noted.   LABORATORY DATA: Pending for today.   Lab Results  Component Value Date   WBC 8.0 11/30/2011   HGB 11.1* 11/30/2011   HCT 34.0* 11/30/2011   PLT 283 11/30/2011   GLUCOSE 125* 11/23/2011   ALT 12 11/21/2011   AST 21 11/21/2011   NA 138 11/23/2011   K 4.0 11/23/2011   CL 106 11/23/2011    CREATININE 1.27 11/23/2011   BUN 19 11/23/2011   CO2 26 11/23/2011   INR 1.33 11/22/2011   HGBA1C 7.0* 08/18/2011   Lab Results  Component Value Date   INR 1.33 11/22/2011   INR 1.54* 11/21/2011   INR 1.49 11/21/2011    Assessment / Plan:  1. Recent GI bleed. This is his second episode in the past few months. He is to see GI this Friday for further disposition and discussion of further testing. I am holding off on restarting Coumadin until we get this information. I have asked his wife to call and let Tresa Endo (Dr. Jenel Lucks nurse) what the plan from GI is.   2. Chronic atrial fib - rate is controlled.   3. Diastolic heart failure - seems compensated and has chronic DOE that he reports is at baseline.   We will check labs today. Family is to call and let us know about GI's plan for further testing. I think then we can decide about restarting the coumadin.   Patient is agreeable to this plan and will call if any problems develop in the interim.

## 2011-12-12 NOTE — Patient Instructions (Addendum)
For now, stay on your current medicines. We are going to leave you off the Coumadin for now.  We will recheck your blood count today.  After you see Dr. Ewing Schlein on Friday, call Tresa Endo (Dr. Jenel Lucks nurse) and let her know what tests are going to be arranged for you.  Call the [facility] office at [phone] if you have any questions, problems or concerns.

## 2012-03-23 ENCOUNTER — Other Ambulatory Visit: Payer: Self-pay | Admitting: Emergency Medicine

## 2012-03-23 DIAGNOSIS — C8589 Other specified types of non-Hodgkin lymphoma, extranodal and solid organ sites: Secondary | ICD-10-CM

## 2012-03-29 ENCOUNTER — Encounter: Payer: Self-pay | Admitting: Oncology

## 2012-03-29 NOTE — Progress Notes (Signed)
03/29/2012 Spoke with patient's wife concerning request for payment on [facility].  Reassured her that his scans are approved by Medicare A and B.  She has buried her sister and know her brother has died, very  Stressed and didn't want her husband turned away.  Renee Ramus

## 2012-03-30 ENCOUNTER — Ambulatory Visit (HOSPITAL_COMMUNITY)
Admission: RE | Admit: 2012-03-30 | Discharge: 2012-03-30 | Disposition: A | Discharge status: Home / Self Care | Payer: Medicare Other | Source: Ambulatory Visit | Attending: Oncology | Admitting: Oncology

## 2012-03-30 ENCOUNTER — Other Ambulatory Visit (HOSPITAL_BASED_OUTPATIENT_CLINIC_OR_DEPARTMENT_OTHER): Payer: Medicare Other | Admitting: Lab

## 2012-03-30 ENCOUNTER — Encounter (HOSPITAL_COMMUNITY): Payer: Self-pay

## 2012-03-30 DIAGNOSIS — Q619 Cystic kidney disease, unspecified: Secondary | ICD-10-CM | POA: Insufficient documentation

## 2012-03-30 DIAGNOSIS — C8590 Non-Hodgkin lymphoma, unspecified, unspecified site: Secondary | ICD-10-CM

## 2012-03-30 DIAGNOSIS — I2510 Atherosclerotic heart disease of native coronary artery without angina pectoris: Secondary | ICD-10-CM | POA: Insufficient documentation

## 2012-03-30 DIAGNOSIS — M948X9 Other specified disorders of cartilage, unspecified sites: Secondary | ICD-10-CM | POA: Insufficient documentation

## 2012-03-30 DIAGNOSIS — C8589 Other specified types of non-Hodgkin lymphoma, extranodal and solid organ sites: Secondary | ICD-10-CM | POA: Insufficient documentation

## 2012-03-30 DIAGNOSIS — J8410 Pulmonary fibrosis, unspecified: Secondary | ICD-10-CM | POA: Insufficient documentation

## 2012-03-30 DIAGNOSIS — N400 Enlarged prostate without lower urinary tract symptoms: Secondary | ICD-10-CM | POA: Insufficient documentation

## 2012-03-30 DIAGNOSIS — I723 Aneurysm of iliac artery: Secondary | ICD-10-CM | POA: Insufficient documentation

## 2012-03-30 LAB — COMPREHENSIVE METABOLIC PANEL (CC13)
ALT: 15 U/L (ref 0–55)
CO2: 30 mEq/L — ABNORMAL HIGH (ref 22–29)
Calcium: 9.5 mg/dL (ref 8.4–10.4)
Chloride: 101 mEq/L (ref 98–107)
Glucose: 197 mg/dl — ABNORMAL HIGH (ref 70–99)
Sodium: 140 mEq/L (ref 136–145)
Total Protein: 8 g/dL (ref 6.4–8.3)

## 2012-03-30 LAB — CBC WITH DIFFERENTIAL/PLATELET
BASO%: 0.3 % (ref 0.0–2.0)
Eosinophils Absolute: 0.3 10*3/uL (ref 0.0–0.5)
HCT: 45.9 % (ref 38.4–49.9)
LYMPH%: 17.2 % (ref 14.0–49.0)
MCHC: 32.5 g/dL (ref 32.0–36.0)
MONO#: 0.7 10*3/uL (ref 0.1–0.9)
NEUT#: 6 10*3/uL (ref 1.5–6.5)
NEUT%: 71.7 % (ref 39.0–75.0)
Platelets: 180 10*3/uL (ref 140–400)
RBC: 5.22 10*6/uL (ref 4.20–5.82)
WBC: 8.4 10*3/uL (ref 4.0–10.3)
lymph#: 1.4 10*3/uL (ref 0.9–3.3)

## 2012-03-30 MED ORDER — IOHEXOL 300 MG/ML  SOLN
100.0000 mL | Freq: Once | INTRAMUSCULAR | Status: AC | PRN
  Administered 2012-03-30: 100 mL via INTRAVENOUS

## 2012-04-06 ENCOUNTER — Other Ambulatory Visit: Payer: Self-pay | Admitting: Medical Oncology

## 2012-04-06 DIAGNOSIS — C8589 Other specified types of non-Hodgkin lymphoma, extranodal and solid organ sites: Secondary | ICD-10-CM

## 2012-04-09 ENCOUNTER — Ambulatory Visit (HOSPITAL_BASED_OUTPATIENT_CLINIC_OR_DEPARTMENT_OTHER): Payer: Medicare Other | Admitting: Adult Health

## 2012-04-09 ENCOUNTER — Other Ambulatory Visit (HOSPITAL_BASED_OUTPATIENT_CLINIC_OR_DEPARTMENT_OTHER): Payer: Medicare Other | Admitting: Lab

## 2012-04-09 ENCOUNTER — Telehealth: Payer: Self-pay | Admitting: Oncology

## 2012-04-09 ENCOUNTER — Encounter: Payer: Self-pay | Admitting: Adult Health

## 2012-04-09 ENCOUNTER — Ambulatory Visit (HOSPITAL_COMMUNITY)
Admission: RE | Admit: 2012-04-09 | Discharge: 2012-04-09 | Disposition: A | Discharge status: Home / Self Care | Payer: Medicare Other | Source: Ambulatory Visit | Attending: Adult Health | Admitting: Adult Health

## 2012-04-09 VITALS — BP 162/100 | HR 84 | Temp 98.0°F | Resp 20 | Ht 69.0 in | Wt 209.3 lb

## 2012-04-09 DIAGNOSIS — J069 Acute upper respiratory infection, unspecified: Secondary | ICD-10-CM

## 2012-04-09 DIAGNOSIS — R062 Wheezing: Secondary | ICD-10-CM

## 2012-04-09 DIAGNOSIS — I10 Essential (primary) hypertension: Secondary | ICD-10-CM | POA: Insufficient documentation

## 2012-04-09 DIAGNOSIS — J189 Pneumonia, unspecified organism: Secondary | ICD-10-CM

## 2012-04-09 DIAGNOSIS — C8589 Other specified types of non-Hodgkin lymphoma, extranodal and solid organ sites: Secondary | ICD-10-CM

## 2012-04-09 DIAGNOSIS — I517 Cardiomegaly: Secondary | ICD-10-CM | POA: Insufficient documentation

## 2012-04-09 DIAGNOSIS — K922 Gastrointestinal hemorrhage, unspecified: Secondary | ICD-10-CM

## 2012-04-09 DIAGNOSIS — J329 Chronic sinusitis, unspecified: Secondary | ICD-10-CM

## 2012-04-09 DIAGNOSIS — I4891 Unspecified atrial fibrillation: Secondary | ICD-10-CM

## 2012-04-09 LAB — CBC WITH DIFFERENTIAL/PLATELET
Basophils Absolute: 0 10*3/uL (ref 0.0–0.1)
Eosinophils Absolute: 0.2 10*3/uL (ref 0.0–0.5)
HCT: 42.1 % (ref 38.4–49.9)
HGB: 13.9 g/dL (ref 13.0–17.1)
MONO#: 0.6 10*3/uL (ref 0.1–0.9)
NEUT%: 68.7 % (ref 39.0–75.0)
Platelets: 244 10*3/uL (ref 140–400)
WBC: 8.6 10*3/uL (ref 4.0–10.3)
lymph#: 1.8 10*3/uL (ref 0.9–3.3)

## 2012-04-09 LAB — COMPREHENSIVE METABOLIC PANEL (CC13)
Albumin: 3.3 g/dL — ABNORMAL LOW (ref 3.5–5.0)
BUN: 18.6 mg/dL (ref 7.0–26.0)
CO2: 27 mEq/L (ref 22–29)
Chloride: 103 mEq/L (ref 98–107)
Creatinine: 1.6 mg/dL — ABNORMAL HIGH (ref 0.7–1.3)
Glucose: 165 mg/dl — ABNORMAL HIGH (ref 70–99)
Total Protein: 7.8 g/dL (ref 6.4–8.3)

## 2012-04-09 MED ORDER — AZITHROMYCIN 250 MG PO TABS: ORAL_TABLET | ORAL | Status: DC

## 2012-04-09 MED ORDER — LEVOFLOXACIN 500 MG PO TABS: 500.0000 mg | ORAL_TABLET | Freq: Every day | ORAL | Status: DC

## 2012-04-09 MED ORDER — ALBUTEROL SULFATE HFA 108 (90 BASE) MCG/ACT IN AERS: 2.0000 | INHALATION_SPRAY | Freq: Four times a day (QID) | RESPIRATORY_TRACT | Status: AC | PRN

## 2012-04-09 NOTE — Progress Notes (Signed)
OFFICE PROGRESS NOTE  CC  DOOLITTLE, Harrel Lemon, MD [address] [place] [address] Dr. Hillis Range Thomasenia Bottoms PA  DIAGNOSIS: 79 year old male high grade non Hodgkin Lymphoma diagnosed in June 2010  PRIOR THERAPY:  1. Radiation to the thoracic spine and adjacent areas of involvement from 06/17/08 - 07/15/08  2. CHOP_R x 6 cycles 08/07/08 - 11/12/08  3. Scans on 03/2012 were negative for lymphoma  CURRENT THERAPY:observation  INTERVAL HISTORY: Jesse Lambert 79 y.o. male returns for follow up visit. Overall he is doing well.  He recently had scans that were neg. For lymphoma recurrence.  His labs are stable.  He has had nasal drainage and cough x 2 weeks that has been productive of mucous.  He is not on Coumadin anymore due to bleeding.  He denies fevers, chills, feeling short of breath, night sweats, swollen lymph nodes, back pain, or any further concerns.    MEDICAL HISTORY: Past Medical History  Diagnosis Date  . Diabetes mellitus   . Atrial fibrillation     Permanent, Coumadin therapy  . HTN (hypertension)   . Chronic diastolic heart failure   . Anemia   . DM2 (diabetes mellitus, type 2)   . Dementia   . Non Hodgkin's lymphoma     B cell, high-grade  . Cancer     ALLERGIES:  has No Known Allergies.  MEDICATIONS:  Current Outpatient Prescriptions  Medication Sig Dispense Refill  . atorvastatin (LIPITOR) 20 MG tablet Take 10 mg by mouth daily.      . bumetanide (BUMEX) 1 MG tablet Take 1 mg by mouth 2 (two) times daily. 2 mg in the am 1 mg in the pm      . Cyanocobalamin (VITAMIN B-12 CR PO) Take 1 tablet by mouth daily.       Marland Kitchen diltiazem (CARDIZEM CD) 120 MG 24 hr capsule Take 120 mg by mouth daily.      . fish oil-omega-3 fatty acids 1000 MG capsule Take 1 g by mouth daily.      Marland Kitchen glipiZIDE (GLUCOTROL) 5 MG tablet Take 5 mg by mouth 2 (two) times daily before a meal.      . metoprolol succinate (TOPROL-XL) 100 MG 24 hr tablet Take 100 mg by mouth daily.        . Multiple Vitamin (MULTIVITAMIN WITH MINERALS) TABS Take 1 tablet by mouth daily.      Marland Kitchen omeprazole (PRILOSEC) 20 MG capsule Take 2 capsules (40 mg total) by mouth 2 (two) times daily before a meal.  60 capsule  1  . vitamin E 400 UNIT capsule Take 400 Units by mouth daily.      Marland Kitchen albuterol (PROVENTIL HFA;VENTOLIN HFA) 108 (90 BASE) MCG/ACT inhaler Inhale 2 puffs into the lungs every 6 (six) hours as needed for wheezing.  6.7 g  0  . azithromycin (ZITHROMAX) 250 MG tablet Take 2 tabs on day 1, then 1 tab daily until complete  6 each  0   No current facility-administered medications for this visit.    SURGICAL HISTORY:  Past Surgical History  Procedure Laterality Date  . No past surgeries    . Esophagogastroduodenoscopy  08/18/2011    Procedure: ESOPHAGOGASTRODUODENOSCOPY (EGD);  Surgeon: Shirley Friar, MD;  Location: [facility];  Service: Endoscopy;  Laterality: N/A;  . Colonoscopy  08/20/2011    Procedure: COLONOSCOPY;  Surgeon: Graylin Shiver, MD;  Location: [facility];  Service: Endoscopy;  Laterality: N/A;  . Coronary  stent placement      REVIEW OF SYSTEMS:  General: fatigue (-), night sweats (-), fever (-), pain (-) Lymph: palpable nodes (-) HEENT: vision changes (-), mucositis (-), gum bleeding (-), epistaxis (-) Cardiovascular: chest pain (-), palpitations (-) Pulmonary: shortness of breath (-), dyspnea on exertion (-), cough (-), hemoptysis (-) GI:  Early satiety (-), melena (-), dysphagia (-), nausea/vomiting (-), diarrhea (-) GU: dysuria (-), hematuria (-), incontinence (-) Musculoskeletal: joint swelling (-), joint pain (-), back pain (-) Neuro: weakness (-), numbness (-), headache (-), confusion (-) Skin: Rash (-), lesions (-), dryness (-) Psych: depression (-), suicidal/homicidal ideation (-), feeling of hopelessness (-)   PHYSICAL EXAMINATION:  BP 162/100  Pulse 84  Temp(Src) 98 F (36.7 C) (Oral)  Resp 20  Ht 5\' 9"  (1.753 m)  Wt 209 lb 4.8 oz  (94.938 kg)  BMI 30.89 kg/m2 oxygen saturation 97% on Room Air General: Patient is a well appearing male in no acute distress  HEENT: PERRLA, sclerae anicteric no conjunctival pallor, MMM Neck: supple, no palpable adenopathy Lungs: clear to auscultation bilaterally, no wheezes, rhonchi, or rales Cardiovascular: regular rate rhythm, S1, S2, no murmurs, rubs or gallops Abdomen: Soft, non-tender, non-distended, normoactive bowel sounds, no HSM Extremities: warm and well perfused, no clubbing, cyanosis, or edema Skin: No rashes or lesions Neuro: Non-focal ECOG PERFORMANCE STATUS: 1 - Symptomatic but completely ambulatory  LABORATORY DATA: Lab Results  Component Value Date   WBC 8.6 04/09/2012   HGB 13.9 04/09/2012   HCT 42.1 04/09/2012   MCV 87.6 04/09/2012   PLT 244 04/09/2012      Chemistry      Component Value Date/Time   NA 142 04/09/2012 1107   NA 136 12/12/2011 1044   NA 147* 01/24/2011 0830   K 4.0 04/09/2012 1107   K 4.0 12/12/2011 1044   K 4.2 01/24/2011 0830   CL 103 04/09/2012 1107   CL 98 12/12/2011 1044   CL 105 01/24/2011 0830   CO2 27 04/09/2012 1107   CO2 30 12/12/2011 1044   CO2 30 01/24/2011 0830   BUN 18.6 04/09/2012 1107   BUN 16 12/12/2011 1044   BUN 19 01/24/2011 0830   CREATININE 1.6* 04/09/2012 1107   CREATININE 1.7* 12/12/2011 1044   CREATININE 1.4* 01/24/2011 0830      Component Value Date/Time   CALCIUM 9.3 04/09/2012 1107   CALCIUM 8.9 12/12/2011 1044   CALCIUM 9.5 01/24/2011 0830   ALKPHOS 96 04/09/2012 1107   ALKPHOS 69 11/21/2011 1225   ALKPHOS 74 01/24/2011 0830   AST 25 04/09/2012 1107   AST 21 11/21/2011 1225   AST 22 01/24/2011 0830   ALT 18 04/09/2012 1107   ALT 12 11/21/2011 1225   BILITOT 0.78 04/09/2012 1107   BILITOT 0.3 11/21/2011 1225   BILITOT 1.00 01/24/2011 0830       RADIOGRAPHIC STUDIES:   ASSESSMENT: 79 year old male with :  1. Non Hodgkin Lymphoma near the thoracic spine patient recived radiation followed by chemotherapy CHOP-R NED  #2 atrial  fibrillation patient is on long-term anticoagulation his INRs are being checked at his cardiologist's office.  #3 varicella-zoster interruption the lesions have crusted over.  #4 GI bleeding patient was recently hospitalized he was seen by Dr.Magod. Patient will be apparently undergoing a colonoscopy for further workup of bleeding.  #5 Upper respiratory infection    PLAN:   1. Patient is doing well.  His labs are stable and his most recent scans are negative  for lymphoma.    2.  In regards to the upper respiratory infection.  I ordered a chest x ray, Albuterol, and prescribed a Zpak.  He is following up at the [place] on Friday.    All questions were answered. The patient knows to call the clinic with any problems, questions or concerns. We can certainly see the patient much sooner if necessary.  I spent 25 minutes counseling the patient face to face. The total time spent in the appointment was 30 minutes.  Cherie Ouch Lyn Hollingshead, NP Medical Oncology [facility] Phone: [phone] 04/09/2012, 1:27 PM

## 2012-04-09 NOTE — Addendum Note (Signed)
Addended by: Augustin Schooling C on: 04/09/2012 02:24 PM   Modules accepted: Orders, Medications

## 2012-04-09 NOTE — Patient Instructions (Addendum)
Doing well.  I prescribed an antibiotic for the cough and nasal drainage.  No recurrence of the lymphoma.  Please call us if you have any questions or concerns.    Using Your Inhaler Proper inhaler technique is very important. Good technique assures that the medicine reaches the lungs. Poor technique results in depositing the medicine on the tongue and back of the throat rather than in the airways. STEPS TO FOLLOW IF USING INHALER WITHOUT EXTENSION TUBE: 1. Remove cap from inhaler. 2. Shake inhaler for 5 seconds before each inhalation (breathing in). 3. Position the inhaler so that the top of the canister faces up. 4. Put your index finger on the top of the medication canister. Your thumb supports the bottom of the inhaler. 5. Open your mouth. 6. Hold the inhaler 1 to 2 inches away from your open mouth. This allows the medicine to slow down before the medicine enters the mouth. 7. Exhale (breathe out) normally and as completely as possible. 8. Press the canister down with the index finger to release the medication. 9. At the same time as the canister is pressed, inhale deeply and slowly until the lungs are completely filled. This should take 4 to 6 seconds. Keep your tongue down and out of the way. 10. Hold the medication in your lungs for up to 10 seconds (10 seconds is best). This helps the medicine get into the small airways of your lungs to work better. 11. Breathe out slowly, through pursed lips. Whistling is an example of pursed lips. 12. Wait at least 1 minute between puffs. Continue with the above steps until you have taken the number of puffs your caregiver has ordered. 13. Replace cap on inhaler. STEPS TO FOLLOW USING AN INHALER WITH AN EXTENSION (SPACER): 1.  Remove cap from inhaler. 2. Shake inhaler for 5 seconds before each inhalation (breathing in). 3. Your caregiver has asked you to use a spacer with your inhaler. A spacer is a plastic tube with a mouthpiece on one end and an  opening that connects to the inhaler on the other end. A spacer helps you take the medicine better. 4. Place the open end of the spacer onto the mouthpiece of the inhaler. 5. Position the inhaler so that the top of the canister faces up and the spacer mouthpiece faces you. 6. Put your index finger on the top of the medication canister. Your thumb supports the bottom of the inhaler and the spacer. 7. Exhale (breathe out) normally and as completely as possible. 8. Immediately after exhaling, place the spacer between your teeth and into your mouth. Close your mouth tightly around the spacer. 9. Press the canister down with the index finger to release the medication. 10. At the same time as the canister is pressed, inhale deeply and slowly until the lungs are completely filled. This should take 4 to 6 seconds. Keep your tongue down and out of the way. 11. Hold the medication in your lungs for up to 10 seconds (10 seconds is best). This helps the medicine get into the small airways of your lungs to work better. Exhale. 12. Repeat inhaling deeply through the spacer mouthpiece. Again hold that breath for up to 10 seconds (10 seconds is best). Exhale slowly. If it is difficult to take this second deep breath through the spacer, breathe normally several times through the spacer. Remove the spacer from your mouth. 13. Wait at least 1 minute between puffs. Continue with the above steps until you have taken  the number of puffs your caregiver has ordered. 14. Remove spacer from the inhaler and place cap on inhaler. If you are using different kinds of inhalers, use your quick relief medicine to open the airways 10 - 15 minutes before using a steroid. If you are unsure which inhalers to use and the order of using them, ask your caregiver, nurse, or respiratory therapist. If you are using a steroid inhaler, rinse your mouth with water after your last puff and then spit out the water. Do not swallow the water. Avoid  the following:  Inhaling before or after starting the spray of medicine. It takes practice to coordinate your breathing with triggering the spray.  Inhaling through the nose (rather than the mouth) when triggering the spray. HOW TO DETERMINE IF YOUR INHALER IS FULL OR NEARLY EMPTY:  Determine when an inhaler is empty. You cannot know when an inhaler is empty by shaking it. A few inhalers are now being made with dose counters. Ask your caregiver for a prescription that has a dose counter if you feel you need that extra help.  If your inhaler does not have a counter, check the number of doses in the inhaler before you use it. The canister or box will list the number of doses in the canister. Divide the total number of doses in the canister by the number you will use each day to find how many days the canister will last. (For example, if your canister has 200 doses and you take 2 puffs, 4 times each day, which is 8 puffs a day. Dividing 200 by 8 equals 25. The canister should last 25 days.) Using a calendar, count forward that many days to see when your inhaler will run out. Write the refill date on a calendar or your canister.  Remember, if you need to take extra doses, the inhaler will empty sooner than you figured. Be sure you have a refill before your canister runs out. Refill your inhaler 7 to 10 days before it runs out. HOME CARE INSTRUCTIONS   Do not use the inhaler more than your caregiver tells you. If you are still wheezing and are feeling tightness in your chest, call your caregiver.  Keep an adequate supply of medication. This includes making sure the medicine is not expired, and you have a spare inhaler.  Follow your caregiver or inhaler insert directions for cleaning the inhaler and spacer. SEEK MEDICAL CARE IF:   Symptoms are only partially relieved with your inhaler.  You are having trouble using your inhaler.  You experience some increase in phlegm.  You develop a fever of  100.5 F (38.1 C). SEEK IMMEDIATE MEDICAL CARE IF:   You feel little or no relief with your inhalers. You are still wheezing and are feeling shortness of breath and/or tightness in your chest.  You have side effects such as dizziness, headaches or fast heart rate.  You have chills, fever, night sweats or an oral temperature above 102 F (38.9 C).  Phlegm production increases a lot, or there is blood in the phlegm. MAKE SURE YOU:   Understand these instructions.  Will watch your condition.  Will get help right away if you are not doing well or get worse. Document Released: 12/18/1999 Document Revised: 03/14/2011 Document Reviewed: 10/07/2008 [facility] Patient Information 2013 [place], [place].

## 2012-04-18 ENCOUNTER — Ambulatory Visit: Payer: Self-pay | Admitting: Nurse Practitioner

## 2012-04-18 DIAGNOSIS — Z7901 Long term (current) use of anticoagulants: Secondary | ICD-10-CM

## 2012-04-18 DIAGNOSIS — I4891 Unspecified atrial fibrillation: Secondary | ICD-10-CM

## 2012-06-22 NOTE — Telephone Encounter (Signed)
e

## 2012-10-04 ENCOUNTER — Ambulatory Visit: Payer: Medicare Other | Admitting: Adult Health

## 2012-10-04 ENCOUNTER — Other Ambulatory Visit: Payer: Medicare Other | Admitting: Lab

## 2012-10-04 ENCOUNTER — Telehealth: Payer: Self-pay | Admitting: Oncology

## 2012-10-04 NOTE — Telephone Encounter (Signed)
, °

## 2012-10-09 ENCOUNTER — Telehealth: Payer: Self-pay | Admitting: Oncology

## 2012-10-10 ENCOUNTER — Ambulatory Visit: Payer: Medicare Other | Admitting: Adult Health

## 2012-10-10 ENCOUNTER — Other Ambulatory Visit: Payer: Medicare Other | Admitting: Lab

## 2012-10-22 ENCOUNTER — Ambulatory Visit (HOSPITAL_BASED_OUTPATIENT_CLINIC_OR_DEPARTMENT_OTHER): Payer: Medicare Other | Admitting: Adult Health

## 2012-10-22 ENCOUNTER — Ambulatory Visit (HOSPITAL_COMMUNITY)
Admission: RE | Admit: 2012-10-22 | Discharge: 2012-10-22 | Disposition: A | Discharge status: Home / Self Care | Payer: Medicare Other | Source: Ambulatory Visit | Attending: Adult Health | Admitting: Adult Health

## 2012-10-22 ENCOUNTER — Other Ambulatory Visit (HOSPITAL_BASED_OUTPATIENT_CLINIC_OR_DEPARTMENT_OTHER): Payer: Medicare Other

## 2012-10-22 ENCOUNTER — Encounter: Payer: Self-pay | Admitting: Adult Health

## 2012-10-22 ENCOUNTER — Other Ambulatory Visit: Payer: Self-pay | Admitting: Emergency Medicine

## 2012-10-22 VITALS — BP 109/70 | HR 64 | Temp 98.7°F | Resp 18 | Ht 69.0 in | Wt 208.5 lb

## 2012-10-22 DIAGNOSIS — Z7901 Long term (current) use of anticoagulants: Secondary | ICD-10-CM

## 2012-10-22 DIAGNOSIS — C8589 Other specified types of non-Hodgkin lymphoma, extranodal and solid organ sites: Secondary | ICD-10-CM

## 2012-10-22 DIAGNOSIS — I4891 Unspecified atrial fibrillation: Secondary | ICD-10-CM

## 2012-10-22 DIAGNOSIS — K922 Gastrointestinal hemorrhage, unspecified: Secondary | ICD-10-CM

## 2012-10-22 DIAGNOSIS — R0602 Shortness of breath: Secondary | ICD-10-CM

## 2012-10-22 LAB — COMPREHENSIVE METABOLIC PANEL (CC13)
AST: 17 U/L (ref 5–34)
Albumin: 3.7 g/dL (ref 3.5–5.0)
Alkaline Phosphatase: 76 U/L (ref 40–150)
BUN: 28.5 mg/dL — ABNORMAL HIGH (ref 7.0–26.0)
Glucose: 268 mg/dl — ABNORMAL HIGH (ref 70–140)
Potassium: 3.9 mEq/L (ref 3.5–5.1)
Total Bilirubin: 0.79 mg/dL (ref 0.20–1.20)

## 2012-10-22 LAB — CBC WITH DIFFERENTIAL/PLATELET
Basophils Absolute: 0 10*3/uL (ref 0.0–0.1)
EOS%: 2.2 % (ref 0.0–7.0)
Eosinophils Absolute: 0.2 10*3/uL (ref 0.0–0.5)
LYMPH%: 26.1 % (ref 14.0–49.0)
MCH: 29.1 pg (ref 27.2–33.4)
MCV: 89.6 fL (ref 79.3–98.0)
MONO%: 8.5 % (ref 0.0–14.0)
Platelets: 210 10*3/uL (ref 140–400)
RBC: 4.89 10*6/uL (ref 4.20–5.82)
RDW: 14.5 % (ref 11.0–14.6)

## 2012-10-22 NOTE — Progress Notes (Signed)
OFFICE PROGRESS NOTE  CC  DOOLITTLE, Harrel Lemon, MD [address] [place] [address] Dr. Hillis Range Thomasenia Bottoms PA  DIAGNOSIS: 79 year old male high grade non Hodgkin Lymphoma diagnosed in June 2010  PRIOR THERAPY:  1. Radiation to the thoracic spine and adjacent areas of involvement from 06/17/08 - 07/15/08  2. CHOP_R x 6 cycles 08/07/08 - 11/12/08  3. Scans on 03/2012 were negative for lymphoma  CURRENT THERAPY:observation  INTERVAL HISTORY: Jesse Lambert 79 y.o. male returns for follow up visit. He is doing well today.  He denies fatigue, nausea, vomiting, night sweats, fevers, chills, unintentional weight loss, or any further concerns.  He did have a recent pneumonia, and was on a 12 day course of Levaquin, he's been feeling well since.  A 10 point ROS is negative.    MEDICAL HISTORY: Past Medical History  Diagnosis Date  . Diabetes mellitus   . Atrial fibrillation     Permanent, Coumadin therapy  . HTN (hypertension)   . Chronic diastolic heart failure   . Anemia   . DM2 (diabetes mellitus, type 2)   . Dementia   . Non Hodgkin's lymphoma     B cell, high-grade  . Cancer     ALLERGIES:  has No Known Allergies.  MEDICATIONS:  Current Outpatient Prescriptions  Medication Sig Dispense Refill  . atorvastatin (LIPITOR) 20 MG tablet Take 10 mg by mouth daily.      . Cyanocobalamin (VITAMIN B-12 CR PO) Take 1 tablet by mouth daily.       Marland Kitchen diltiazem (CARDIZEM CD) 120 MG 24 hr capsule Take 120 mg by mouth daily.      . fish oil-omega-3 fatty acids 1000 MG capsule Take 1 g by mouth daily.      Marland Kitchen glipiZIDE (GLUCOTROL) 5 MG tablet Take 5 mg by mouth 2 (two) times daily before a meal.      . IRON, FERROUS GLUCONATE, PO Take 1 each by mouth.      . metoprolol succinate (TOPROL-XL) 100 MG 24 hr tablet Take 100 mg by mouth daily.       . Multiple Vitamin (MULTIVITAMIN WITH MINERALS) TABS Take 1 tablet by mouth daily.      Marland Kitchen omeprazole (PRILOSEC) 20 MG capsule Take 2  capsules (40 mg total) by mouth 2 (two) times daily before a meal.  60 capsule  1  . TORSEMIDE PO Take 1 each by mouth.      . vitamin E 400 UNIT capsule Take 400 Units by mouth daily.      Marland Kitchen albuterol (PROVENTIL HFA;VENTOLIN HFA) 108 (90 BASE) MCG/ACT inhaler Inhale 2 puffs into the lungs every 6 (six) hours as needed for wheezing.  6.7 g  0   No current facility-administered medications for this visit.    SURGICAL HISTORY:  Past Surgical History  Procedure Laterality Date  . No past surgeries    . Esophagogastroduodenoscopy  08/18/2011    Procedure: ESOPHAGOGASTRODUODENOSCOPY (EGD);  Surgeon: Shirley Friar, MD;  Location: [facility];  Service: Endoscopy;  Laterality: N/A;  . Colonoscopy  08/20/2011    Procedure: COLONOSCOPY;  Surgeon: Graylin Shiver, MD;  Location: [facility];  Service: Endoscopy;  Laterality: N/A;  . Coronary stent placement      REVIEW OF SYSTEMS:  A 10 point review of systems was conducted and is otherwise negative except for what is noted above.      PHYSICAL EXAMINATION:  BP 109/70  Pulse 64  Temp(Src)  98.7 F (37.1 C) (Oral)  Resp 18  Ht 5\' 9"  (1.753 m)  Wt 208 lb 8 oz (94.575 kg)  BMI 30.78 kg/m2 oxygen saturation 97% on Room Air General: Patient is a well appearing male in no acute distress  HEENT: PERRLA, sclerae anicteric no conjunctival pallor, MMM Neck: supple, no palpable adenopathy Lungs: clear to auscultation bilaterally, no wheezes, rhonchi, or rales, mild crackles in LLL Cardiovascular: regular rate rhythm, S1, S2, no murmurs, rubs or gallops Abdomen: Soft, non-tender, non-distended, normoactive bowel sounds, no HSM Extremities: warm and well perfused, no clubbing, cyanosis, or edema Skin: No rashes or lesions Neuro: Non-focal ECOG PERFORMANCE STATUS: 1 - Symptomatic but completely ambulatory  LABORATORY DATA: Lab Results  Component Value Date   WBC 8.7 10/22/2012   HGB 14.2 10/22/2012   HCT 43.9 10/22/2012   MCV 89.6  10/22/2012   PLT 210 10/22/2012      Chemistry      Component Value Date/Time   NA 142 10/22/2012 1411   NA 136 12/12/2011 1044   NA 147* 01/24/2011 0830   K 3.9 10/22/2012 1411   K 4.0 12/12/2011 1044   K 4.2 01/24/2011 0830   CL 103 04/09/2012 1107   CL 98 12/12/2011 1044   CL 105 01/24/2011 0830   CO2 28 10/22/2012 1411   CO2 30 12/12/2011 1044   CO2 30 01/24/2011 0830   BUN 28.5* 10/22/2012 1411   BUN 16 12/12/2011 1044   BUN 19 01/24/2011 0830   CREATININE 2.6* 10/22/2012 1411   CREATININE 1.7* 12/12/2011 1044   CREATININE 1.4* 01/24/2011 0830      Component Value Date/Time   CALCIUM 9.4 10/22/2012 1411   CALCIUM 8.9 12/12/2011 1044   CALCIUM 9.5 01/24/2011 0830   ALKPHOS 76 10/22/2012 1411   ALKPHOS 69 11/21/2011 1225   ALKPHOS 74 01/24/2011 0830   AST 17 10/22/2012 1411   AST 21 11/21/2011 1225   AST 22 01/24/2011 0830   ALT 11 10/22/2012 1411   ALT 12 11/21/2011 1225   ALT 19 01/24/2011 0830   BILITOT 0.79 10/22/2012 1411   BILITOT 0.3 11/21/2011 1225   BILITOT 1.00 01/24/2011 0830       RADIOGRAPHIC STUDIES:   ASSESSMENT: 79 year old male with :  1. Non Hodgkin Lymphoma near the thoracic spine patient recived radiation followed by chemotherapy CHOP-R NED  #2 atrial fibrillation patient is on long-term anticoagulation his INRs are being checked at his cardiologist's office.  #3 varicella-zoster interruption the lesions have crusted over.  #4 GI bleeding patient was recently hospitalized he was seen by Dr.  Ewing Schlein. Patient will be apparently undergoing a colonoscopy for further workup of bleeding.  #5 Upper respiratory infection--patient completed course of Levaquin 1 month ago.     PLAN:   1. Doing well.  No sign of recurrence. I ordered a chest x ray, to ensure his pneumonia cleared.  We also faxed his labs to his PCP at the [place], his creatinine is elevated.    2.  Patient will return in March.  We will get scans at that time.    All questions were answered. The  patient knows to call the clinic with any problems, questions or concerns. We can certainly see the patient much sooner if necessary.  I spent 25 minutes counseling the patient face to face. The total time spent in the appointment was 30 minutes.  Illa Level, NP Medical Oncology [facility] [phone]   10/23/2012, 4:24  PM

## 2012-10-22 NOTE — Patient Instructions (Signed)
Doing well.  No sign of recurrence.  We will get scans in March.  We will get a chest x ray today.  We will see you back in 6 months.  Please call us if you have any questions or concerns.

## 2013-04-12 ENCOUNTER — Other Ambulatory Visit: Payer: Self-pay | Admitting: *Deleted

## 2013-04-12 DIAGNOSIS — I5032 Chronic diastolic (congestive) heart failure: Secondary | ICD-10-CM

## 2013-04-12 DIAGNOSIS — D696 Thrombocytopenia, unspecified: Secondary | ICD-10-CM

## 2013-04-12 DIAGNOSIS — C8589 Other specified types of non-Hodgkin lymphoma, extranodal and solid organ sites: Secondary | ICD-10-CM

## 2013-04-22 ENCOUNTER — Ambulatory Visit (HOSPITAL_COMMUNITY): Payer: Medicare Other

## 2013-04-22 ENCOUNTER — Other Ambulatory Visit: Payer: Medicare Other

## 2013-04-29 ENCOUNTER — Ambulatory Visit (HOSPITAL_BASED_OUTPATIENT_CLINIC_OR_DEPARTMENT_OTHER): Payer: Medicare Other | Admitting: Hematology and Oncology

## 2013-04-29 ENCOUNTER — Ambulatory Visit (HOSPITAL_BASED_OUTPATIENT_CLINIC_OR_DEPARTMENT_OTHER): Payer: Medicare Other

## 2013-04-29 ENCOUNTER — Telehealth: Payer: Self-pay | Admitting: Hematology and Oncology

## 2013-04-29 VITALS — BP 118/76 | HR 78 | Temp 98.4°F | Resp 20 | Ht 69.0 in | Wt 211.7 lb

## 2013-04-29 DIAGNOSIS — C8589 Other specified types of non-Hodgkin lymphoma, extranodal and solid organ sites: Secondary | ICD-10-CM

## 2013-04-29 DIAGNOSIS — I4891 Unspecified atrial fibrillation: Secondary | ICD-10-CM

## 2013-04-29 DIAGNOSIS — C8299 Follicular lymphoma, unspecified, extranodal and solid organ sites: Secondary | ICD-10-CM

## 2013-04-29 LAB — CBC WITH DIFFERENTIAL/PLATELET
BASO%: 0.2 % (ref 0.0–2.0)
Basophils Absolute: 0 10*3/uL (ref 0.0–0.1)
EOS%: 3.2 % (ref 0.0–7.0)
Eosinophils Absolute: 0.1 10*3/uL (ref 0.0–0.5)
HCT: 42.5 % (ref 38.4–49.9)
HGB: 13.7 g/dL (ref 13.0–17.1)
LYMPH#: 1.5 10*3/uL (ref 0.9–3.3)
LYMPH%: 33.8 % (ref 14.0–49.0)
MCH: 29.7 pg (ref 27.2–33.4)
MCHC: 32.2 g/dL (ref 32.0–36.0)
MCV: 92.4 fL (ref 79.3–98.0)
MONO#: 0.4 10*3/uL (ref 0.1–0.9)
MONO%: 8.2 % (ref 0.0–14.0)
NEUT#: 2.4 10*3/uL (ref 1.5–6.5)
NEUT%: 54.6 % (ref 39.0–75.0)
Platelets: 147 10*3/uL (ref 140–400)
RBC: 4.6 10*6/uL (ref 4.20–5.82)
RDW: 17.3 % — AB (ref 11.0–14.6)
WBC: 4.4 10*3/uL (ref 4.0–10.3)

## 2013-04-29 LAB — COMPREHENSIVE METABOLIC PANEL (CC13)
ALBUMIN: 2.8 g/dL — AB (ref 3.5–5.0)
ALT: 12 U/L (ref 0–55)
AST: 17 U/L (ref 5–34)
Alkaline Phosphatase: 120 U/L (ref 40–150)
Anion Gap: 9 mEq/L (ref 3–11)
BUN: 21.4 mg/dL (ref 7.0–26.0)
CALCIUM: 9 mg/dL (ref 8.4–10.4)
CHLORIDE: 100 meq/L (ref 98–109)
CO2: 29 mEq/L (ref 22–29)
Creatinine: 1.7 mg/dL — ABNORMAL HIGH (ref 0.7–1.3)
Glucose: 463 mg/dl — ABNORMAL HIGH (ref 70–140)
POTASSIUM: 4.3 meq/L (ref 3.5–5.1)
Sodium: 138 mEq/L (ref 136–145)
Total Bilirubin: 0.55 mg/dL (ref 0.20–1.20)
Total Protein: 7 g/dL (ref 6.4–8.3)

## 2013-04-29 LAB — LACTATE DEHYDROGENASE (CC13): LDH: 208 U/L (ref 125–245)

## 2013-04-29 NOTE — Progress Notes (Signed)
OFFICE PROGRESS NOTE  CC  DOOLITTLE, Linton Ham, MD [place] [address] Dr. Thompson Grayer Crista Curb PA  Chief complaint: Non-Hodgkin's lymphoma surveillance  DIAGNOSIS: 80 year old male high grade non Hodgkin Lymphoma diagnosed in June 2010  PRIOR THERAPY:  1. Radiation to the thoracic spine and adjacent areas of involvement from 06/17/08 - 07/15/08  2. CHOP_R x 6 cycles 08/07/08 - 11/12/08  3. Scans on 03/2012 were negative for lymphoma  CURRENT THERAPY:observation  INTERVAL HISTORY: Jesse Lambert 80 y.o. male returns for follow up visit.  He denies fever, night sweats, weight loss or decrease in appetite ,fatigue, nausea, vomiting.  He says that he had colonoscopy done for the lower GI bleed and was told no GI source of bleeding was noted. It was thought   it could be due to Coumadin. since the time his Coumadin was stopped, no further episodes of bleeding was noted. He is at present and aspirin  denies any constipation blood in the stool/ blood in the urine, shortness of breath, chest pain, palpitations or any other  problems at this point     MEDICAL HISTORY: Past Medical History  Diagnosis Date  . Diabetes mellitus   . Atrial fibrillation     Permanent, Coumadin therapy  . HTN (hypertension)   . Chronic diastolic heart failure   . Anemia   . DM2 (diabetes mellitus, type 2)   . Dementia   . Non Hodgkin's lymphoma     B cell, high-grade  . Cancer     ALLERGIES:  has No Known Allergies.  MEDICATIONS:  Current Outpatient Prescriptions  Medication Sig Dispense Refill  . albuterol (PROVENTIL HFA;VENTOLIN HFA) 108 (90 BASE) MCG/ACT inhaler Inhale 2 puffs into the lungs every 6 (six) hours as needed for wheezing.  6.7 g  0  . atorvastatin (LIPITOR) 20 MG tablet Take 10 mg by mouth daily.      . Cyanocobalamin (VITAMIN B-12 CR PO) Take 1 tablet by mouth daily.       Marland Kitchen diltiazem (CARDIZEM CD) 120 MG 24 hr capsule Take 120 mg by mouth daily.      .  fish oil-omega-3 fatty acids 1000 MG capsule Take 1 g by mouth daily.      Marland Kitchen glipiZIDE (GLUCOTROL) 5 MG tablet Take 5 mg by mouth 2 (two) times daily before a meal.      . metoprolol succinate (TOPROL-XL) 100 MG 24 hr tablet Take 100 mg by mouth daily.       Marland Kitchen omeprazole (PRILOSEC) 20 MG capsule Take 2 capsules (40 mg total) by mouth 2 (two) times daily before a meal.  60 capsule  1  . TORSEMIDE PO Take 1 each by mouth.      . vitamin E 400 UNIT capsule Take 400 Units by mouth daily.      . IRON, FERROUS GLUCONATE, PO Take 1 each by mouth.      . Multiple Vitamin (MULTIVITAMIN WITH MINERALS) TABS Take 1 tablet by mouth daily.       No current facility-administered medications for this visit.    SURGICAL HISTORY:  Past Surgical History  Procedure Laterality Date  . No past surgeries    . Esophagogastroduodenoscopy  08/18/2011    Procedure: ESOPHAGOGASTRODUODENOSCOPY (EGD);  Surgeon: Lear Ng, MD;  Location: [facility];  Service: Endoscopy;  Laterality: N/A;  . Colonoscopy  08/20/2011    Procedure: COLONOSCOPY;  Surgeon: Wonda Horner, MD;  Location: [facility];  Service: Endoscopy;  Laterality: N/A;  . Coronary stent placement      REVIEW OF SYSTEMS:  A 10 point review of systems was conducted and is otherwise negative except for what is noted above.      PHYSICAL EXAMINATION:  BP 118/76  Pulse 78  Temp(Src) 98.4 F (36.9 C) (Oral)  Resp 20  Ht 5\' 9"  (1.753 m)  Wt 211 lb 11.2 oz (96.026 kg)  BMI 31.25 kg/m2 oxygen saturation 97% on Room Air General: Patient is a well appearing male in no acute distress  HEENT: PERRLA, sclerae anicteric no conjunctival pallor, MMM Neck: supple, no palpable adenopathy Lungs: clear to auscultation bilaterally, no wheezes, rhonchi, or rales, mild crackles in LLL Cardiovascular: regular rate rhythm, S1, S2, no murmurs, rubs or gallops Abdomen: Soft, non-tender, non-distended, normoactive bowel sounds, no HSM Extremities: warm and  well perfused, no clubbing, cyanosis, or edema Skin: No rashes or lesions Neuro: Non-focal ECOG PERFORMANCE STATUS: 1 - Symptomatic but completely ambulatory  LABORATORY DATA: Lab Results  Component Value Date   WBC 8.7 10/22/2012   HGB 14.2 10/22/2012   HCT 43.9 10/22/2012   MCV 89.6 10/22/2012   PLT 210 10/22/2012      Chemistry      Component Value Date/Time   NA 142 10/22/2012 1411   NA 136 12/12/2011 1044   NA 147* 01/24/2011 0830   K 3.9 10/22/2012 1411   K 4.0 12/12/2011 1044   K 4.2 01/24/2011 0830   CL 103 04/09/2012 1107   CL 98 12/12/2011 1044   CL 105 01/24/2011 0830   CO2 28 10/22/2012 1411   CO2 30 12/12/2011 1044   CO2 30 01/24/2011 0830   BUN 28.5* 10/22/2012 1411   BUN 16 12/12/2011 1044   BUN 19 01/24/2011 0830   CREATININE 2.6* 10/22/2012 1411   CREATININE 1.7* 12/12/2011 1044   CREATININE 1.4* 01/24/2011 0830      Component Value Date/Time   CALCIUM 9.4 10/22/2012 1411   CALCIUM 8.9 12/12/2011 1044   CALCIUM 9.5 01/24/2011 0830   ALKPHOS 76 10/22/2012 1411   ALKPHOS 69 11/21/2011 1225   ALKPHOS 74 01/24/2011 0830   AST 17 10/22/2012 1411   AST 21 11/21/2011 1225   AST 22 01/24/2011 0830   ALT 11 10/22/2012 1411   ALT 12 11/21/2011 1225   ALT 19 01/24/2011 0830   BILITOT 0.79 10/22/2012 1411   BILITOT 0.3 11/21/2011 1225   BILITOT 1.00 01/24/2011 0830       RADIOGRAPHIC STUDIES:   ASSESSMENT: 80 year old male with :  #1  Non Hodgkin Lymphoma near the thoracic spine patient recived radiation followed by chemotherapy R-CHOP  #2 atrial fibrillation:  Coumadin was discontinued in view of GI bleeding. Continue aspirin as recommended by cardiology   #3  history of varicella-zoster interruption : Resolved   #4 H/o GI bleeding was seen by Dr.  Watt Climes during the hospitalization. His bleeding  stopped after discontinuation of Coumadin. He is closely followed by GI       PLAN:   #1 CBC and differential, CMP and LDH today    #2 next follow up visit in 6  months with CBC differential, CMP, LDH on the day of next visit  All questions were answered. The patient knows to call the clinic with any problems, questions or concerns. We can certainly see the patient much sooner if necessary.  I spent 56minutes counseling the patient face to face. The total time spent in the  appointment was 10 minutes.   Wilmon Arms, M.D.  [facility] [phone]   04/29/2013, 2:31 PM

## 2013-04-29 NOTE — Telephone Encounter (Signed)
pr pof to sch pt-wanted pt to go to lab today-printed pt a sch for other app time & date

## 2013-05-01 ENCOUNTER — Telehealth: Payer: Self-pay | Admitting: Adult Health

## 2013-05-01 NOTE — Telephone Encounter (Signed)
per [facility] move pt time to 10:45 from [place] sch-cld & talked w/wife Jesse Lambert and adv of new time-wife understood-adv would mail copy of sch

## 2013-06-25 ENCOUNTER — Telehealth: Payer: Self-pay | Admitting: *Deleted

## 2013-06-25 NOTE — Telephone Encounter (Signed)
On 06-25-13 fax medical records to Dr. Ayesha Rumpf at [place] it was consult note, end of tx note.

## 2013-07-03 DEATH — deceased

## 2013-10-07 ENCOUNTER — Ambulatory Visit: Payer: Medicare Other

## 2013-10-07 ENCOUNTER — Encounter: Payer: Medicare Other | Admitting: Adult Health

## 2013-10-07 NOTE — Progress Notes (Signed)
This encounter was created in error - please disregard.

## 2013-10-08 ENCOUNTER — Telehealth: Payer: Self-pay | Admitting: Adult Health

## 2013-10-08 NOTE — Telephone Encounter (Signed)
, °

## 2013-10-15 ENCOUNTER — Ambulatory Visit: Payer: Medicare Other | Admitting: Adult Health

## 2013-12-13 IMAGING — CR DG CHEST 2V
2 series · 2 of 2 positions shown · non-contrast
Comparison: Radiograph 04/09/2012

CLINICAL DATA: Short of breath, recent pneumonia

EXAM:
CHEST  2 VIEW

[w chest pa]
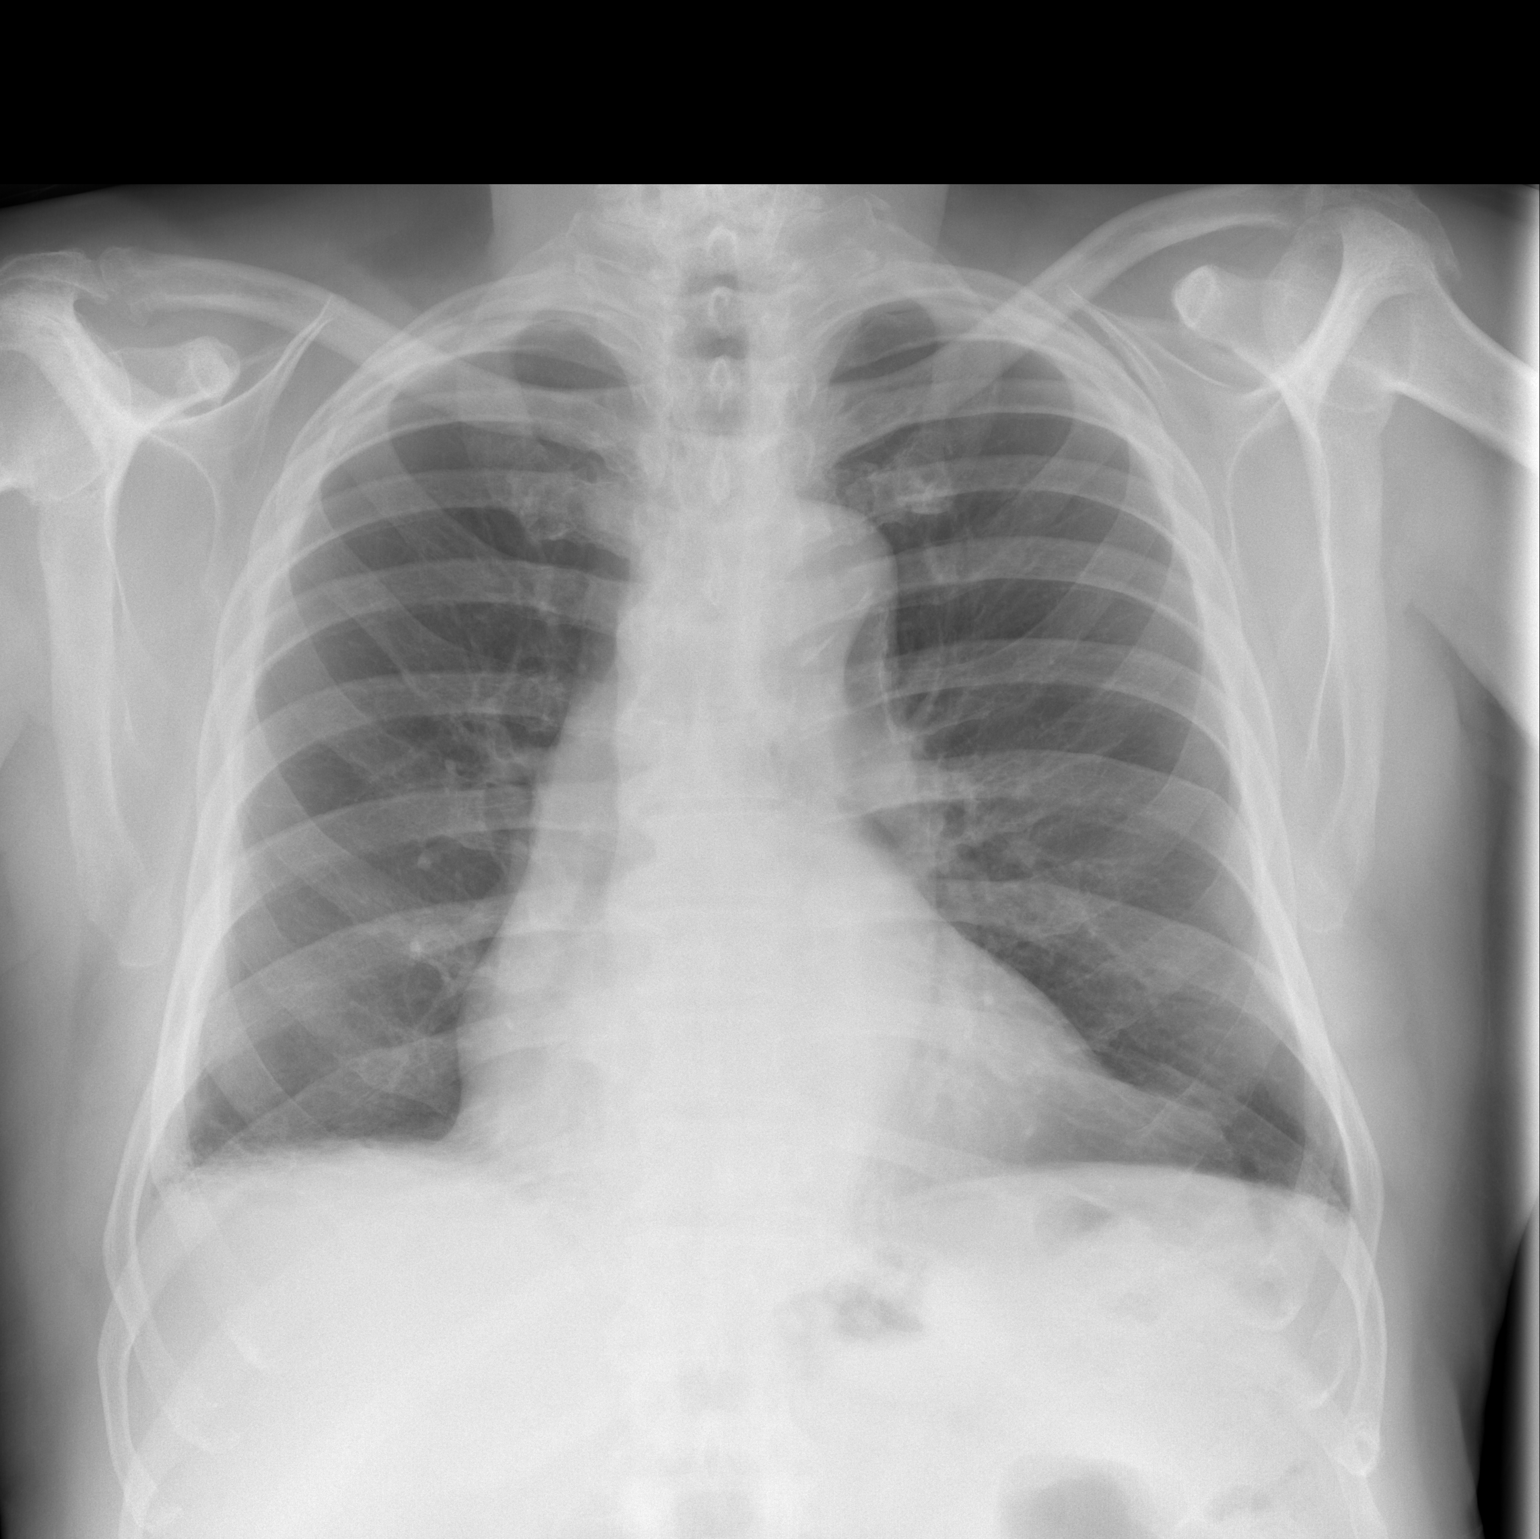

[w chest lat]
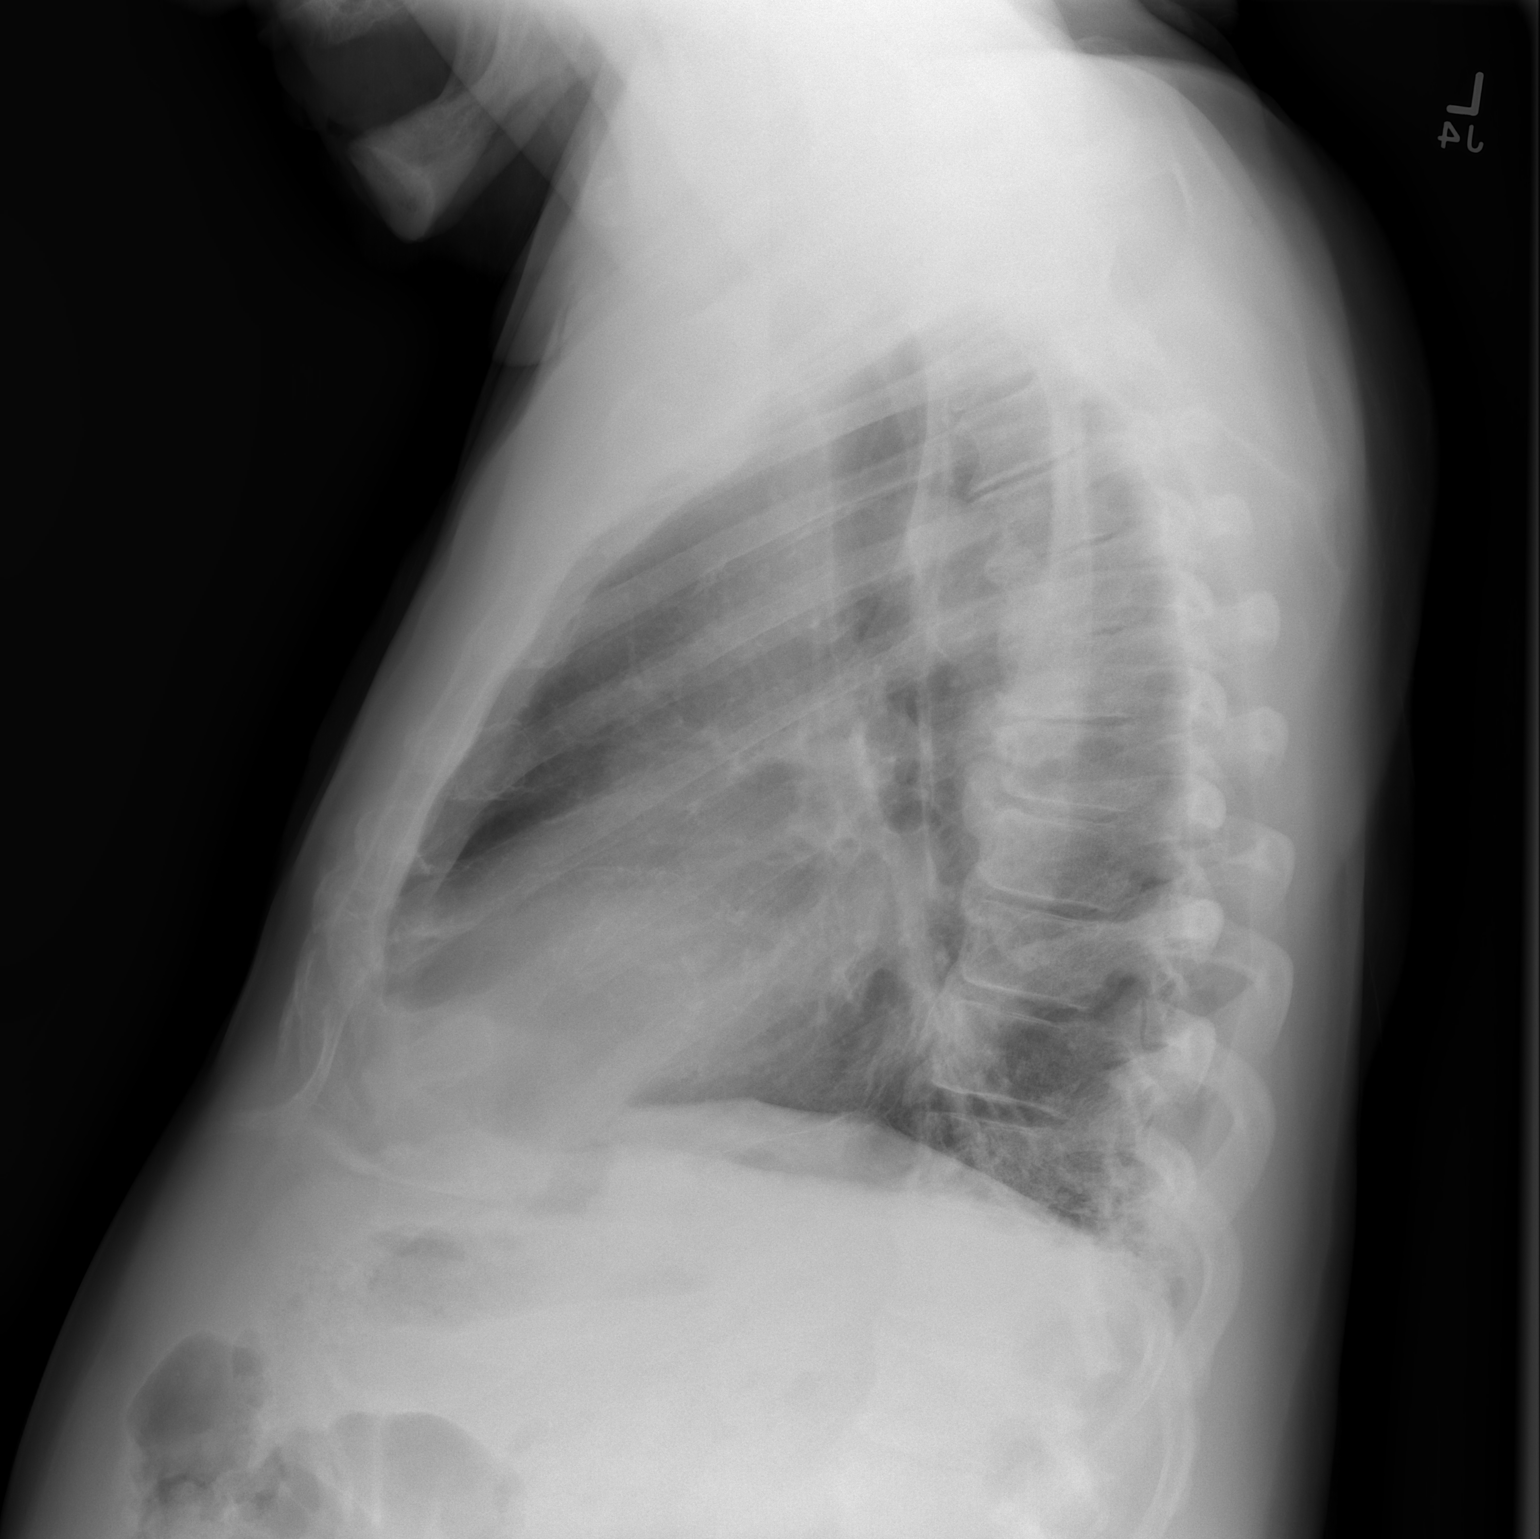

[2 of 2 positions shown; findings below may reference images not displayed]

FINDINGS: Normal mediastinum and cardiac silhouette. Normal pulmonary
vasculature. No evidence of effusion, infiltrate, or pneumothorax.
Mild a right basilar atelectasis is unchanged. No acute bony
abnormality.
IMPRESSION: No acute cardiopulmonary process. Mild hyperinflation and mild right
basilar atelectasis. .
